# Patient Record
Sex: Female | Born: 1945
Health system: Southern US, Community
[De-identification: ages and names within clinical notes are randomized; demographics above are authoritative.]

## PROBLEM LIST (undated history)

## (undated) DIAGNOSIS — J300 Vasomotor rhinitis: Secondary | ICD-10-CM

## (undated) DIAGNOSIS — S82409A Unspecified fracture of shaft of unspecified fibula, initial encounter for closed fracture: Secondary | ICD-10-CM

## (undated) DIAGNOSIS — Z923 Personal history of irradiation: Secondary | ICD-10-CM

## (undated) DIAGNOSIS — H269 Unspecified cataract: Secondary | ICD-10-CM

## (undated) DIAGNOSIS — IMO0002 Reserved for concepts with insufficient information to code with codable children: Secondary | ICD-10-CM

## (undated) DIAGNOSIS — R011 Cardiac murmur, unspecified: Secondary | ICD-10-CM

## (undated) DIAGNOSIS — B009 Herpesviral infection, unspecified: Secondary | ICD-10-CM

## (undated) DIAGNOSIS — R55 Syncope and collapse: Secondary | ICD-10-CM

## (undated) DIAGNOSIS — B029 Zoster without complications: Secondary | ICD-10-CM

## (undated) DIAGNOSIS — I519 Heart disease, unspecified: Secondary | ICD-10-CM

## (undated) DIAGNOSIS — C50919 Malignant neoplasm of unspecified site of unspecified female breast: Secondary | ICD-10-CM

## (undated) DIAGNOSIS — I7300 Raynaud's syndrome without gangrene: Secondary | ICD-10-CM

## (undated) DIAGNOSIS — E039 Hypothyroidism, unspecified: Secondary | ICD-10-CM

## (undated) DIAGNOSIS — I10 Essential (primary) hypertension: Secondary | ICD-10-CM

## (undated) DIAGNOSIS — J4520 Mild intermittent asthma, uncomplicated: Secondary | ICD-10-CM

## (undated) DIAGNOSIS — E0500 Thyrotoxicosis with diffuse goiter without thyrotoxic crisis or storm: Secondary | ICD-10-CM

## (undated) DIAGNOSIS — D219 Benign neoplasm of connective and other soft tissue, unspecified: Secondary | ICD-10-CM

## (undated) DIAGNOSIS — J45909 Unspecified asthma, uncomplicated: Secondary | ICD-10-CM

## (undated) DIAGNOSIS — M8580 Other specified disorders of bone density and structure, unspecified site: Secondary | ICD-10-CM

## (undated) DIAGNOSIS — K5790 Diverticulosis of intestine, part unspecified, without perforation or abscess without bleeding: Secondary | ICD-10-CM

## (undated) DIAGNOSIS — Z8739 Personal history of other diseases of the musculoskeletal system and connective tissue: Secondary | ICD-10-CM

## (undated) DIAGNOSIS — E785 Hyperlipidemia, unspecified: Secondary | ICD-10-CM

## (undated) HISTORY — DX: Thyrotoxicosis with diffuse goiter without thyrotoxic crisis or storm: E05.00

## (undated) HISTORY — DX: Hyperlipidemia, unspecified: E78.5

## (undated) HISTORY — DX: Unspecified asthma, uncomplicated: J45.909

## (undated) HISTORY — DX: Vasomotor rhinitis: J30.0

## (undated) HISTORY — PX: OTHER SURGICAL HISTORY: SHX169

## (undated) HISTORY — PX: BREAST EXCISIONAL BIOPSY: SUR124

## (undated) HISTORY — DX: Mild intermittent asthma, uncomplicated: J45.20

## (undated) HISTORY — PX: THYROIDECTOMY: SHX17

## (undated) HISTORY — DX: Diverticulosis of intestine, part unspecified, without perforation or abscess without bleeding: K57.90

## (undated) HISTORY — PX: ABDOMINAL HYSTERECTOMY: SHX81

## (undated) HISTORY — DX: Unspecified cataract: H26.9

## (undated) HISTORY — DX: Unspecified fracture of shaft of unspecified fibula, initial encounter for closed fracture: S82.409A

## (undated) HISTORY — DX: Benign neoplasm of connective and other soft tissue, unspecified: D21.9

## (undated) HISTORY — DX: Other specified disorders of bone density and structure, unspecified site: M85.80

## (undated) HISTORY — DX: Hypothyroidism, unspecified: E03.9

## (undated) HISTORY — PX: MINOR BREAST BIOPSY: SHX5977

## (undated) HISTORY — DX: Syncope and collapse: R55

## (undated) HISTORY — DX: Personal history of irradiation: Z92.3

## (undated) HISTORY — DX: Zoster without complications: B02.9

## (undated) HISTORY — DX: Essential (primary) hypertension: I10

## (undated) HISTORY — DX: Personal history of other diseases of the musculoskeletal system and connective tissue: Z87.39

## (undated) HISTORY — DX: Herpesviral infection, unspecified: B00.9

## (undated) HISTORY — DX: Cardiac murmur, unspecified: R01.1

## (undated) HISTORY — DX: Heart disease, unspecified: I51.9

## (undated) HISTORY — PX: INCONTINENCE SURGERY: SHX676

## (undated) HISTORY — DX: Reserved for concepts with insufficient information to code with codable children: IMO0002

---

## 1996-02-02 DIAGNOSIS — D219 Benign neoplasm of connective and other soft tissue, unspecified: Secondary | ICD-10-CM

## 1996-02-02 HISTORY — DX: Benign neoplasm of connective and other soft tissue, unspecified: D21.9

## 1997-06-25 ENCOUNTER — Other Ambulatory Visit: Admission: RE | Admit: 1997-06-25 | Discharge: 1997-06-25 | Payer: Self-pay | Admitting: Obstetrics and Gynecology

## 1998-07-14 ENCOUNTER — Other Ambulatory Visit: Admission: RE | Admit: 1998-07-14 | Discharge: 1998-07-14 | Payer: Self-pay | Admitting: Gynecology

## 1999-07-06 ENCOUNTER — Encounter: Admission: RE | Admit: 1999-07-06 | Discharge: 1999-07-06 | Payer: Self-pay | Admitting: Obstetrics and Gynecology

## 1999-07-06 ENCOUNTER — Encounter: Payer: Self-pay | Admitting: Obstetrics and Gynecology

## 1999-08-10 ENCOUNTER — Other Ambulatory Visit: Admission: RE | Admit: 1999-08-10 | Discharge: 1999-08-10 | Payer: Self-pay | Admitting: Obstetrics and Gynecology

## 1999-12-23 ENCOUNTER — Inpatient Hospital Stay (HOSPITAL_COMMUNITY): Admission: AD | Admit: 1999-12-23 | Discharge: 1999-12-23 | Payer: Self-pay | Admitting: Obstetrics and Gynecology

## 2000-01-20 ENCOUNTER — Inpatient Hospital Stay (HOSPITAL_COMMUNITY): Admission: AD | Admit: 2000-01-20 | Discharge: 2000-01-20 | Payer: Self-pay | Admitting: Obstetrics and Gynecology

## 2000-02-02 DIAGNOSIS — M8580 Other specified disorders of bone density and structure, unspecified site: Secondary | ICD-10-CM

## 2000-02-02 HISTORY — DX: Other specified disorders of bone density and structure, unspecified site: M85.80

## 2000-02-02 HISTORY — PX: HYSTEROSCOPY WITH RESECTOSCOPE: SHX5395

## 2000-02-18 ENCOUNTER — Inpatient Hospital Stay (HOSPITAL_COMMUNITY): Admission: AD | Admit: 2000-02-18 | Discharge: 2000-02-18 | Payer: Self-pay | Admitting: Obstetrics and Gynecology

## 2000-03-22 ENCOUNTER — Encounter (HOSPITAL_COMMUNITY): Admission: AD | Admit: 2000-03-22 | Discharge: 2000-05-19 | Payer: Self-pay | Admitting: Obstetrics and Gynecology

## 2000-05-23 ENCOUNTER — Encounter (INDEPENDENT_AMBULATORY_CARE_PROVIDER_SITE_OTHER): Payer: Self-pay

## 2000-05-23 ENCOUNTER — Ambulatory Visit (HOSPITAL_COMMUNITY): Admission: RE | Admit: 2000-05-23 | Discharge: 2000-05-23 | Payer: Self-pay | Admitting: Obstetrics and Gynecology

## 2000-07-19 ENCOUNTER — Encounter: Payer: Self-pay | Admitting: Obstetrics and Gynecology

## 2000-07-19 ENCOUNTER — Encounter: Admission: RE | Admit: 2000-07-19 | Discharge: 2000-07-19 | Payer: Self-pay | Admitting: Obstetrics and Gynecology

## 2000-08-29 ENCOUNTER — Other Ambulatory Visit: Admission: RE | Admit: 2000-08-29 | Discharge: 2000-08-29 | Payer: Self-pay | Admitting: Obstetrics and Gynecology

## 2001-08-22 ENCOUNTER — Encounter: Payer: Self-pay | Admitting: Obstetrics and Gynecology

## 2001-08-22 ENCOUNTER — Encounter: Admission: RE | Admit: 2001-08-22 | Discharge: 2001-08-22 | Payer: Self-pay | Admitting: Obstetrics and Gynecology

## 2001-10-09 ENCOUNTER — Other Ambulatory Visit: Admission: RE | Admit: 2001-10-09 | Discharge: 2001-10-09 | Payer: Self-pay | Admitting: Obstetrics and Gynecology

## 2002-02-01 DIAGNOSIS — K5790 Diverticulosis of intestine, part unspecified, without perforation or abscess without bleeding: Secondary | ICD-10-CM

## 2002-02-01 HISTORY — DX: Diverticulosis of intestine, part unspecified, without perforation or abscess without bleeding: K57.90

## 2002-07-05 ENCOUNTER — Encounter: Payer: Self-pay | Admitting: Internal Medicine

## 2002-09-21 ENCOUNTER — Encounter: Payer: Self-pay | Admitting: Obstetrics and Gynecology

## 2002-09-21 ENCOUNTER — Encounter: Admission: RE | Admit: 2002-09-21 | Discharge: 2002-09-21 | Payer: Self-pay | Admitting: Obstetrics and Gynecology

## 2002-10-16 ENCOUNTER — Other Ambulatory Visit: Admission: RE | Admit: 2002-10-16 | Discharge: 2002-10-16 | Payer: Self-pay | Admitting: Obstetrics and Gynecology

## 2003-10-17 ENCOUNTER — Encounter: Admission: RE | Admit: 2003-10-17 | Discharge: 2003-10-17 | Payer: Self-pay | Admitting: Obstetrics and Gynecology

## 2003-11-13 ENCOUNTER — Other Ambulatory Visit: Admission: RE | Admit: 2003-11-13 | Discharge: 2003-11-13 | Payer: Self-pay | Admitting: Obstetrics and Gynecology

## 2003-12-18 ENCOUNTER — Ambulatory Visit: Payer: Self-pay | Admitting: Critical Care Medicine

## 2004-02-05 ENCOUNTER — Ambulatory Visit: Payer: Self-pay | Admitting: Critical Care Medicine

## 2004-07-28 ENCOUNTER — Ambulatory Visit: Payer: Self-pay | Admitting: Critical Care Medicine

## 2004-11-26 ENCOUNTER — Other Ambulatory Visit: Admission: RE | Admit: 2004-11-26 | Discharge: 2004-11-26 | Payer: Self-pay | Admitting: Obstetrics and Gynecology

## 2004-12-02 ENCOUNTER — Encounter: Admission: RE | Admit: 2004-12-02 | Discharge: 2004-12-02 | Payer: Self-pay | Admitting: Obstetrics and Gynecology

## 2005-02-01 DIAGNOSIS — S82409A Unspecified fracture of shaft of unspecified fibula, initial encounter for closed fracture: Secondary | ICD-10-CM

## 2005-02-01 HISTORY — DX: Unspecified fracture of shaft of unspecified fibula, initial encounter for closed fracture: S82.409A

## 2005-07-23 ENCOUNTER — Ambulatory Visit: Payer: Self-pay | Admitting: Critical Care Medicine

## 2005-09-14 ENCOUNTER — Encounter: Admission: RE | Admit: 2005-09-14 | Discharge: 2005-09-14 | Payer: Self-pay | Admitting: Endocrinology

## 2005-11-30 ENCOUNTER — Other Ambulatory Visit: Admission: RE | Admit: 2005-11-30 | Discharge: 2005-11-30 | Payer: Self-pay | Admitting: Obstetrics and Gynecology

## 2005-12-07 ENCOUNTER — Encounter: Admission: RE | Admit: 2005-12-07 | Discharge: 2005-12-07 | Payer: Self-pay | Admitting: Obstetrics and Gynecology

## 2006-04-14 ENCOUNTER — Ambulatory Visit: Payer: Self-pay | Admitting: Pulmonary Disease

## 2006-12-19 ENCOUNTER — Other Ambulatory Visit: Admission: RE | Admit: 2006-12-19 | Discharge: 2006-12-19 | Payer: Self-pay | Admitting: Obstetrics and Gynecology

## 2007-02-14 ENCOUNTER — Encounter: Admission: RE | Admit: 2007-02-14 | Discharge: 2007-02-14 | Payer: Self-pay | Admitting: Obstetrics and Gynecology

## 2007-02-15 DIAGNOSIS — J45909 Unspecified asthma, uncomplicated: Secondary | ICD-10-CM | POA: Insufficient documentation

## 2007-02-15 DIAGNOSIS — J309 Allergic rhinitis, unspecified: Secondary | ICD-10-CM | POA: Insufficient documentation

## 2007-02-16 ENCOUNTER — Ambulatory Visit: Payer: Self-pay | Admitting: Critical Care Medicine

## 2007-02-20 ENCOUNTER — Encounter: Payer: Self-pay | Admitting: Critical Care Medicine

## 2008-01-09 ENCOUNTER — Other Ambulatory Visit: Admission: RE | Admit: 2008-01-09 | Discharge: 2008-01-09 | Payer: Self-pay | Admitting: Obstetrics and Gynecology

## 2008-01-12 ENCOUNTER — Ambulatory Visit: Payer: Self-pay | Admitting: Critical Care Medicine

## 2008-01-18 ENCOUNTER — Ambulatory Visit: Payer: Self-pay | Admitting: Critical Care Medicine

## 2008-01-23 ENCOUNTER — Telehealth: Payer: Self-pay | Admitting: Critical Care Medicine

## 2008-01-31 ENCOUNTER — Telehealth (INDEPENDENT_AMBULATORY_CARE_PROVIDER_SITE_OTHER): Payer: Self-pay | Admitting: *Deleted

## 2008-02-02 HISTORY — PX: ROTATOR CUFF REPAIR: SHX139

## 2008-02-21 ENCOUNTER — Encounter: Admission: RE | Admit: 2008-02-21 | Discharge: 2008-02-21 | Payer: Self-pay | Admitting: Obstetrics and Gynecology

## 2008-02-27 ENCOUNTER — Encounter: Admission: RE | Admit: 2008-02-27 | Discharge: 2008-02-27 | Payer: Self-pay | Admitting: Obstetrics and Gynecology

## 2008-03-14 ENCOUNTER — Ambulatory Visit (HOSPITAL_BASED_OUTPATIENT_CLINIC_OR_DEPARTMENT_OTHER): Admission: RE | Admit: 2008-03-14 | Discharge: 2008-03-15 | Payer: Self-pay | Admitting: Orthopedic Surgery

## 2008-03-19 ENCOUNTER — Telehealth: Payer: Self-pay | Admitting: Internal Medicine

## 2009-01-20 ENCOUNTER — Encounter: Admission: RE | Admit: 2009-01-20 | Discharge: 2009-01-20 | Payer: Self-pay | Admitting: Obstetrics and Gynecology

## 2009-02-21 ENCOUNTER — Ambulatory Visit: Payer: Self-pay | Admitting: Critical Care Medicine

## 2009-02-25 DIAGNOSIS — I10 Essential (primary) hypertension: Secondary | ICD-10-CM | POA: Insufficient documentation

## 2009-03-12 ENCOUNTER — Encounter: Admission: RE | Admit: 2009-03-12 | Discharge: 2009-03-12 | Payer: Self-pay | Admitting: Obstetrics and Gynecology

## 2009-03-20 ENCOUNTER — Ambulatory Visit: Payer: Self-pay | Admitting: Critical Care Medicine

## 2009-07-04 ENCOUNTER — Telehealth (INDEPENDENT_AMBULATORY_CARE_PROVIDER_SITE_OTHER): Payer: Self-pay | Admitting: *Deleted

## 2009-12-17 ENCOUNTER — Ambulatory Visit: Payer: Self-pay | Admitting: Critical Care Medicine

## 2010-01-14 ENCOUNTER — Ambulatory Visit: Payer: Self-pay | Admitting: Critical Care Medicine

## 2010-03-03 NOTE — Assessment & Plan Note (Signed)
Summary: NP follow up - cough   Primary Provider/Referring Provider:  [facility]   CC:  was given a zpak then avelox by Dr. Evlyn Kanner for cough and sinus infection.  finished avelox 5days ago, still having dry cough, chest congestion, and occ wheezing and SOB x2weeks - denies f/c/s.  History of Present Illness: 65 year old, white female, with mild intermittent asthma.    January 12, 2008 -- No recent changes.  No complaints.  Not using rescue inhaler.  February 21, 2009--Presents for asthma flare: tightness in chest, increased SOB x3-4weeks - denies cough, wheezing. Has felt lightheaded over last few days. C/O if she takes in real deep breath she feels lightheaded. Lightheaded if she stands up. B/P is low today in office.     March 20, 2009 --Pt was seen 02/21/09.  No new issues.    December 17, 2009--Presents for an acute office visit. Complains of cough, congestion. He  was given a zpak then avelox by Dr. Evlyn Kanner for cough and sinus infection.  finished avelox 5days ago, still having dry cough, chest congestion, occ wheezing and SOB x2weeks. She is feeling better w/ decreased congestion but cough has not totally resolved and has some wheezing. Denies chest pain, , orthopnea, hemoptysis, fever, n/v/d, edema, headache.   Medications Prior to Update: 1)  Asmanex 60 Metered Doses 220 Mcg/inh Aepb (Mometasone Furoate) .... Inhale 1 Puff As Directed Once A Day 2)  Synthroid 75 Mcg Tabs (Levothyroxine Sodium) .... Take 1 Tablet By Mouth Once A Day 3)  Mucinex 600 Mg  Tb12 (Guaifenesin) .... As Needed 4)  Caduet 5-10 Mg  Tabs (Amlodipine-Atorvastatin) .... Take 1 Tablet By Mouth Once A Day 5)  Activella 0.5-0.1 Mg  Tabs (Estradiol-Norethindrone Acet) .... Three Times A Week 6)  Ventolin Hfa 108 (90 Base) Mcg/act  Aers (Albuterol Sulfate) .Marland Kitchen.. 1-2 Puffs Every 4-6 Hours As Needed 7)  Vitamin D 1000 Unit Tabs (Cholecalciferol) .Marland Kitchen.. 1 Tab By Mouth Three Times A Week 8)  Oscal 500/200 D-3 500-200 Mg-Unit  Tabs (Calcium-Vitamin D) .... Take 1 Tablet By Mouth Once A Day 9)  Extratest Hormone .Marland Kitchen.. 3 Times A Week  Current Medications (verified): 1)  Asmanex 60 Metered Doses 220 Mcg/inh Aepb (Mometasone Furoate) .... Inhale 1 Puff As Directed Once A Day 2)  Synthroid 75 Mcg Tabs (Levothyroxine Sodium) .... Take 1 Tablet By Mouth Once A Day 3)  Mucinex 600 Mg  Tb12 (Guaifenesin) .... As Needed 4)  Caduet 5-10 Mg  Tabs (Amlodipine-Atorvastatin) .... Take 1 Tablet By Mouth Once A Day 5)  Activella 0.5-0.1 Mg  Tabs (Estradiol-Norethindrone Acet) .... Three Times A Week 6)  Ventolin Hfa 108 (90 Base) Mcg/act  Aers (Albuterol Sulfate) .Marland Kitchen.. 1-2 Puffs Every 4-6 Hours As Needed 7)  Vitamin D 1000 Unit Tabs (Cholecalciferol) .Marland Kitchen.. 1 Tab By Mouth Twcie A Week 8)  Oscal 500/200 D-3 500-200 Mg-Unit Tabs (Calcium-Vitamin D) .... Take 1 Tablet By Mouth Once A Day 9)  Extratest Hormone .Marland Kitchen.. 3 Times A Week  Allergies (verified): 1)  ! Pcn 2)  ! * Pneumovax  Past History:  Past Medical History: Last updated: 01/12/2008 Current Problems:  ASTHMA, INTERMITTENT, MILD (ICD-493.90)    -FeV1 96% 2004 ASTHMATIC BRONCHITIS, ACUTE (ICD-466.0) RHINITIS, VASOMOTOR (ICD-477.9)  Family History: Last updated: 02/21/2009 rheumatism - mother aneurysm - mother  Social History: Last updated: 02/21/2009 Patient never smoked.  Positive history of passive tobacco smoke exposure.  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 Evlyn Kanner for cough and sinus infection.  finished avelox 5days ago, still having dry cough, chest congestion, occ wheezing and SOB x2weeks - denies f/c/s Is Patient Diabetic? No Comments Medications reviewed with patient Daytime contact number verified with patient. Boone Master CNA/MA  December 17, 2009 11:22 AM    Physical Exam  Additional Exam:  Gen: WD WN      WF    in NAD    NCAT Heent:  no jvd, no TMG, no cervical LNademopathy, orophyx clear,  nares with clear watery drainage. Cor: RRR nl s1/s2  no s3/s4  no m r h g Abd: soft NT BSA   no masses  No HSM  no rebound or guarding Ext perfused with no c v e v.d Neuro: intact, moves all 4s, CN II-XII intact, DTRs intact Chest: coarse BS w/ no wheezing  Skin: clear  Genital/Rectal :deferred    Impression & Recommendations:  Problem # 1:  ASTHMATIC BRONCHITIS, ACUTE (ICD-466.0)  Slow to resolve exacerbation Plan Prednisone taper over next week.  Zyrtec 10mg  at bedtime for drainage, as needed  Tessalon three times a day as needed cough Try not to cough or clear your throat. use sugarless candy, ice chips, water  Mucinex DM two times a day as needed cough.  Please contact office for sooner follow up if symptoms do not improve or worsen  Her updated medication list for this problem includes:    Asmanex 60 Metered Doses 220 Mcg/inh Aepb (Mometasone furoate) ..... Inhale 1 puff as directed once a day    Mucinex 600 Mg Tb12 (Guaifenesin) .Marland Kitchen... As needed    Ventolin Hfa 108 (90 Base) Mcg/act Aers (Albuterol sulfate) .Marland Kitchen... 1-2 puffs every 4-6 hours as needed    Tessalon 200 Mg Caps (Benzonatate) .Marland Kitchen... 1 by mouth three times a day as needed cough  Orders: Est. Patient Level IV ([address])  Medications Added to Medication List This Visit: 1)  Vitamin D 1000 Unit Tabs (Cholecalciferol) .Marland Kitchen.. 1 tab by mouth twcie a week 2)  Prednisone 10 Mg Tabs (Prednisone) .... 4 tabs for 2 days, then 3 tabs for 2  days, 2 tabs for 2 days, then 1 tab for 2 days, then stop 3)  Tessalon 200 Mg Caps (Benzonatate) .Marland Kitchen.. 1 by mouth three times a day as needed cough  Complete Medication List: 1)  Asmanex 60 Metered Doses 220 Mcg/inh Aepb (Mometasone furoate) .... Inhale 1 puff as directed once a day 2)  Synthroid 75 Mcg Tabs (Levothyroxine sodium) .... Take 1 tablet by mouth once a day 3)  Mucinex 600 Mg Tb12 (Guaifenesin) .... As needed 4)  Caduet 5-10 Mg Tabs (Amlodipine-atorvastatin) .... Take 1 tablet by mouth once a day 5)  Activella 0.5-0.1 Mg Tabs (Estradiol-norethindrone acet) .... Three times a week  6)  Ventolin Hfa 108 (90 Base) Mcg/act Aers (Albuterol sulfate) .Marland Kitchen.. 1-2 puffs every 4-6 hours as needed 7)  Vitamin D 1000 Unit Tabs (Cholecalciferol) .Marland Kitchen.. 1 tab by mouth twcie a week 8)  Oscal 500/200 D-3 500-200 Mg-unit Tabs (Calcium-vitamin d) .... Take 1 tablet by mouth once a day 9)  Extratest Hormone  .Marland Kitchen.. 3 times a week 10)  Prednisone 10 Mg Tabs (Prednisone) .... 4 tabs for 2 days, then 3 tabs for 2 days, 2 tabs for 2 days, then 1 tab for 2 days, then stop 11)  Tessalon 200 Mg Caps (Benzonatate) .Marland Kitchen.. 1 by mouth three times a day as needed cough  Patient Instructions: 1)  Prednisone taper over next week.  2)  Zyrtec 10mg  at bedtime for drainage, as needed  3)  Tessalon three times a day as needed cough 4)  Try not to cough or clear your throat. use sugarless candy, ice chips, water  5)  Mucinex DM two times a day as needed cough.  6)  Please contact office for sooner follow up if symptoms do not improve or worsen  7)  follow up Dr. Delford Field in 3 weeks  Prescriptions: TESSALON 200 MG CAPS (BENZONATATE) 1 by mouth three times a day as needed cough  #30 x 0   Entered and Authorized by:   Rubye Oaks NP   Signed by:   Tammy Parrett NP on 12/17/2009   Method used:   Electronically to        [facility]. [address] [phone]* (retail)       [address]       [place], [place]   [address]       Ph: [phone] or [phone]       Fax: [phone]   RxID:   [id number] PREDNISONE 10 MG TABS (PREDNISONE) 4 tabs for 2 days, then 3 tabs for 2 days, 2 tabs for 2 days, then 1 tab for 2 days, then stop  #20 x 0   Entered and Authorized by:   Rubye Oaks NP   Signed by:   Tammy Parrett NP on 12/17/2009   Method used:   Electronically to        [facility]. [address] [phone]* (retail)       [address]       [place], [place]  [address]       Ph: [phone] or [phone]       Fax: [phone]   RxID:   [id number]    Immunization History:  Influenza Immunization History:    Influenza:  historical (12/02/2009)

## 2010-03-03 NOTE — Assessment & Plan Note (Signed)
Summary: Acute NP office visit -asthma   Primary Provider/Referring Provider:  [facility]   CC:  asthma flare: tightness in chest, increased SOB x3-4weeks - denies cough, and wheezing.  History of Present Illness: This is a 65 year old, white female, with mild intermittent asthma.    January 12, 2008 -- No recent changes.  No complaints.  Not using rescue inhaler.  February 21, 2009--Presents for asthma flare: tightness in chest, increased SOB x3-4weeks - denies cough, wheezing. Has felt lightheaded over last few days. C/O if she takes in real deep breath she feels lightheaded. Lightheaded if she stands up. B/P is low today in office.     Medications Prior to Update: 1)  Asmanex 60 Metered Doses 220 Mcg/inh Aepb (Mometasone Furoate) .... Inhale 1 Puff As Directed Once A Day 2)  Synthroid 100 Mcg  Tabs (Levothyroxine Sodium) .... Take 1 Tablet By Mouth Once A Day 3)  Mucinex 600 Mg  Tb12 (Guaifenesin) .... As Needed 4)  Caduet 5-10 Mg  Tabs (Amlodipine-Atorvastatin) .... Take 1 Tablet By Mouth Once A Day 5)  Activella 0.5-0.1 Mg  Tabs (Estradiol-Norethindrone Acet) .... Three Times A Week 6)  Ventolin Hfa 108 (90 Base) Mcg/act  Aers (Albuterol Sulfate) .Marland Kitchen.. 1-2 Puffs Every 4-6 Hours As Needed 7)  Zithromax Z-Pak 250 Mg Tabs (Azithromycin) .... Use As Directed  Current Medications (verified): 1)  Asmanex 60 Metered Doses 220 Mcg/inh Aepb (Mometasone Furoate) .... Inhale 1 Puff As Directed Once A Day 2)  Synthroid 75 Mcg Tabs (Levothyroxine Sodium) .... Take 1 Tablet By Mouth Once A Day 3)  Mucinex 600 Mg  Tb12 (Guaifenesin) .... As Needed 4)  Caduet 5-10 Mg  Tabs (Amlodipine-Atorvastatin) .... Take 1 Tablet By Mouth Once A Day 5)  Activella 0.5-0.1 Mg  Tabs (Estradiol-Norethindrone Acet) .... Three Times A Week 6)  Ventolin Hfa 108 (90 Base) Mcg/act  Aers (Albuterol Sulfate) .Marland Kitchen.. 1-2 Puffs Every 4-6 Hours As Needed 7)  Vitamin D 1000 Unit Tabs (Cholecalciferol) .Marland Kitchen.. 1 Tab By Mouth Three  Times A Week 8)  Oscal 500/200 D-3 500-200 Mg-Unit Tabs (Calcium-Vitamin D) .... Take 1 Tablet By Mouth Once A Day 9)  Extratest Hormone .Marland Kitchen.. 3 Times A Week  Allergies (verified): 1)  ! Pcn 2)  ! * Pneumovax  Past History:  Family History: Last updated: 02/21/2009 rheumatism - mother aneurysm - mother  Social History: Last updated: 02/21/2009 Patient never smoked.  Positive history of passive tobacco smoke exposure.  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  Positive history of passive tobacco smoke exposure.  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 No Comments Medications reviewed with patient Daytime contact number verified with patient. Boone Master CNA  February 21, 2009 2:53 PM     Impression & Recommendations:  Problem # 1:  ASTHMA, INTERMITTENT, MILD (ICD-493.90) Mild flare:  REC;  Increase Asmanex 2 puffs two times a day for 2 weeks then back daily   Please contact office for sooner follow up if symptoms do not improve or worsen  follow up Dr. Delford Field as scheduled.   The following medications were removed from the medication list:    Zithromax Z-pak 250 Mg Tabs (Azithromycin) ..... Use as directed Her updated medication list for this problem includes:    Asmanex 60 Metered Doses 220 Mcg/inh Aepb  (Mometasone furoate) ..... Inhale 1 puff as directed once a day    Mucinex 600 Mg Tb12 (Guaifenesin) .Marland Kitchen... As needed    Ventolin Hfa 108 (90 Base) Mcg/act Aers (Albuterol sulfate) .Marland Kitchen... 1-2 puffs every 4-6 hours as needed  Problem # 2:  HYPERTENSION (ICD-401.9) Hold Caduet today Restart tomorrow , if  blood pressure is <110 hold caduet.  follow up Dr. Evlyn Kanner next week for blood pressure   Her updated medication list for this problem includes:    Caduet 5-10 Mg Tabs (Amlodipine-atorvastatin) .Marland Kitchen... Take 1 tablet by mouth once a day  Medications Added to Medication List This Visit: 1)  Synthroid 75 Mcg Tabs (Levothyroxine sodium) .... Take 1 tablet by mouth once a day 2)  Vitamin D 1000 Unit Tabs (Cholecalciferol) .Marland Kitchen.. 1 tab by mouth three times a week 3)  Oscal 500/200 D-3 500-200 Mg-unit Tabs (Calcium-vitamin d) .... Take 1 tablet by mouth once a day 4)  Extratest Hormone  .Marland Kitchen.. 3 times a week  Complete Medication List: 1)  Asmanex 60 Metered Doses 220 Mcg/inh Aepb (Mometasone furoate) .... Inhale 1 puff as directed once a day 2)  Synthroid 75 Mcg Tabs (Levothyroxine sodium) .... Take 1 tablet by mouth once a day 3)  Mucinex 600 Mg Tb12 (Guaifenesin) .... As needed 4)  Caduet 5-10 Mg Tabs (Amlodipine-atorvastatin) .... Take 1 tablet by mouth once a day 5)  Activella 0.5-0.1 Mg Tabs (Estradiol-norethindrone acet) .... Three times a week 6)  Ventolin Hfa 108 (90 Base) Mcg/act Aers (Albuterol sulfate) .Marland Kitchen.. 1-2 puffs every 4-6 hours as needed 7)  Vitamin D 1000 Unit Tabs (Cholecalciferol) .Marland Kitchen.. 1 tab by mouth three times a week 8)  Oscal 500/200 D-3 500-200 Mg-unit Tabs (Calcium-vitamin d) .... Take 1 tablet by mouth once a day 9)  Extratest Hormone  .Marland Kitchen.. 3 times a week  Patient Instructions: 1)  Increase Asmanex 2 puffs two times a day for 2 weeks then back daily  2)  Hold Caduet today 3)  Restart tomorrow , if  blood pressure is <110 hold caduet.  4)  follow up Dr. Evlyn Kanner next week for  blood pressure  5)  Please contact office for sooner follow up if symptoms do not improve or worsen  6)  follow up Dr. Delford Field as scheduled.    Immunization History:  Influenza Immunization History:    Influenza:  historical (11/01/2008)  Pneumovax Immunization History:    Pneumovax:  pt allergic (02/21/2009)   Appended Document: Acute NP office visit -asthma   Appended Document: Acute NP office visit -asthma I agree with this plan of care   pw

## 2010-03-03 NOTE — Assessment & Plan Note (Signed)
Summary: Pulmonary OV   Primary Provider/Referring Provider:  [facility]   CC:  Yearly follow up for refills.  states breathing is doing good-denies wheezing, chest tightness, and cough.  No problems per pt.  Requesting rxs for asmanex and ventolin-[facility] [place].  .  History of Present Illness: This is a 65 year old, white female, with mild intermittent asthma.    January 12, 2008 -- No recent changes.  No complaints.  Not using rescue inhaler.  February 21, 2009--Presents for asthma flare: tightness in chest, increased SOB x3-4weeks - denies cough, wheezing. Has felt lightheaded over last few days. C/O if she takes in real deep breath she feels lightheaded. Lightheaded if she stands up. B/P is low today in office.     March 20, 2009 4:27 PM Pt was seen 02/21/09.  No new issues.   Pt denies any significant sore throat, nasal congestion or excess secretions, fever, chills, sweats, unintended weight loss, pleurtic or exertional chest pain, orthopnea PND, or leg swelling Pt denies any increase in rescue therapy over baseline, denies waking up needing it or having any early am or nocturnal exacerbations of coughing/wheezing/or dyspnea.   Asthma History    Initial Asthma Severity Rating:    Age range: 12+ years    Symptoms: 0-2 days/week    Nighttime Awakenings: 0-2/month    Interferes w/ normal activity: no limitations    SABA use (not for EIB): 0-2 days/week    Asthma Severity Assessment: Intermittent   Preventive Screening-Counseling & Management  Alcohol-Tobacco     Smoking Status: quit > 6 months  Current Medications (verified): 1)  Asmanex 60 Metered Doses 220 Mcg/inh Aepb (Mometasone Furoate) .... Inhale 1 Puff As Directed Once A Day 2)  Synthroid 75 Mcg Tabs (Levothyroxine Sodium) .... Take 1 Tablet By Mouth Once A Day 3)  Mucinex 600 Mg  Tb12 (Guaifenesin) .... As Needed 4)  Caduet 5-10 Mg  Tabs (Amlodipine-Atorvastatin) .... Take 1 Tablet By Mouth Once A Day 5)   Activella 0.5-0.1 Mg  Tabs (Estradiol-Norethindrone Acet) .... Three Times A Week 6)  Ventolin Hfa 108 (90 Base) Mcg/act  Aers (Albuterol Sulfate) .Marland Kitchen.. 1-2 Puffs Every 4-6 Hours As Needed 7)  Vitamin D 1000 Unit Tabs (Cholecalciferol) .Marland Kitchen.. 1 Tab By Mouth Three Times A Week 8)  Oscal 500/200 D-3 500-200 Mg-Unit Tabs (Calcium-Vitamin D) .... Take 1 Tablet By Mouth Once A Day 9)  Extratest Hormone .Marland Kitchen.. 3 Times A Week  Allergies (verified): 1)  ! Pcn 2)  ! * Pneumovax  Past History:  Past medical, surgical, family and social histories (including risk factors) reviewed for relevance to current acute and chronic problems.  Past Medical History: Reviewed history from 01/12/2008 and no changes required. Current Problems:  ASTHMA, INTERMITTENT, MILD (ICD-493.90)    -FeV1 96% 2004 ASTHMATIC BRONCHITIS, ACUTE (ICD-466.0) RHINITIS, VASOMOTOR (ICD-477.9)  Family History: Reviewed history from 02/21/2009 and no changes required. rheumatism - mother aneurysm - mother  Social History: Reviewed history from 02/21/2009 and no changes required. Patient never smoked.  Positive history of passive tobacco smoke exposure.  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    Vital Signs:  Patient profile:   65 year old female Height:      62.5 inches Weight:      124.38 pounds BMI:     22.47 O2 Sat:      98 % on Room air Temp:     98.2  degrees F oral Pulse rate:   57 / minute BP sitting:   152 / 76  (left arm) Cuff size:   regular  Vitals Entered By: Gweneth Dimitri RN (March 20, 2009 4:15 PM)  O2 Flow:  Room air CC: Yearly follow up for refills.  states breathing is doing good-denies wheezing, chest tightness, cough.  No problems per pt.  Requesting rxs for asmanex and ventolin-[facility] [place].   Comments Medications reviewed with patient Daytime contact number verified with patient. Gweneth Dimitri RN  March 20, 2009 4:15 PM    Physical Exam  Additional Exam:  Gen: WD WN      WF    in NAD    NCAT Heent:  no jvd, no TMG, no cervical LNademopathy, orophyx clear,  nares with clear watery drainage. Cor: RRR nl s1/s2  no s3/s4  no m r h g Abd: soft NT BSA   no masses  No HSM  no rebound or guarding Ext perfused with no c v e v.d Neuro: intact, moves all 4s, CN II-XII intact, DTRs intact Chest: Clear  no wheezes, rales, rhonchi   no egophony  no consolidative breath sounds, mild hyperresonance to percussion Skin: clear  Genital/Rectal :deferred    Impression & Recommendations:  Problem # 1:  ASTHMA, INTERMITTENT, MILD (ICD-493.90) Assessment Improved improved asthma control plan cont same inhaled program  Complete Medication List: 1)  Asmanex 60 Metered Doses 220 Mcg/inh Aepb (Mometasone furoate) .... Inhale 1 puff as directed once a day 2)  Synthroid 75 Mcg Tabs (Levothyroxine sodium) .... Take 1 tablet by mouth once a day 3)  Mucinex 600 Mg Tb12 (Guaifenesin) .... As needed 4)  Caduet 5-10 Mg Tabs (Amlodipine-atorvastatin) .... Take 1 tablet by mouth once a day 5)  Activella 0.5-0.1 Mg Tabs (Estradiol-norethindrone acet) .... Three times a week 6)  Ventolin Hfa 108 (90 Base) Mcg/act Aers (Albuterol sulfate) .Marland Kitchen.. 1-2 puffs every 4-6 hours as needed 7)  Vitamin D 1000 Unit Tabs (Cholecalciferol) .Marland Kitchen.. 1 tab by mouth three times a week 8)  Oscal 500/200 D-3 500-200 Mg-unit Tabs (Calcium-vitamin d) .... Take  1 tablet by mouth once a day 9)  Extratest Hormone  .Marland Kitchen.. 3 times a week  Other Orders: Est. Patient Level III ([address])  Patient Instructions: 1)  No change in medications 2)  Return 12 months or sooner if necessary Prescriptions: ASMANEX 60 METERED DOSES 220 MCG/INH AEPB (MOMETASONE FUROATE) Inhale 1 puff as directed once a day  #3 x 4   Entered and Authorized by:   Storm Frisk MD   Signed by:   Storm Frisk MD on 03/20/2009   Method used:   Electronically to        [facility] [address] [phone]* (retail)       [address]       [place], [place]  [address]       Ph: [phone] or [phone]       Fax: [phone]   RxID:   [id number]

## 2010-03-03 NOTE — Progress Notes (Signed)
Summary: rx req/ congestion  Phone Note Call from Patient   Caller: Patient Call For: [facility] Summary of Call: pt req zpac. pt states that she has felt tight in her chest - also hoarseness x 2 days. says dr Delford Field usually prescribes a zpac for her and this "knocks it out fast. pt has not used any OTC meds as she says they never help w/ this. pt denies fever. no N or V. [place]- [facility] [place] [address] [phone]. pt# [phone] Initial call taken by: Tivis Ringer, CNA,  July 04, 2009 8:51 AM  Follow-up for Phone Call        pt c/o chest tightness, and hoarseness x 2 days. pt denies cough or fever at this time. [place] and wants an rx for a zpac be called in in case she gets worse over the weekend. Sh erequests this messge be sent to TP. Please advise. thanks.Carron Curie CMA  July 04, 2009 9:43 AM   Additional Follow-up for Phone Call Additional follow up Details #1::        Zpack #1 take as directed.  no refills Please contact office for sooner follow up if symptoms do not improve or worsen  Additional Follow-up by: Rubye Oaks NP,  July 04, 2009 10:06 AM    Additional Follow-up for Phone Call Additional follow up Details #2::    rx called to pharmacy in [place] and pt aware, also told pt if no better after antibiotic would need ov Follow-up by: Philipp Deputy CMA,  July 04, 2009 10:35 AM

## 2010-03-05 ENCOUNTER — Ambulatory Visit (INDEPENDENT_AMBULATORY_CARE_PROVIDER_SITE_OTHER): Payer: PRIVATE HEALTH INSURANCE | Admitting: Critical Care Medicine

## 2010-03-05 ENCOUNTER — Encounter: Payer: Self-pay | Admitting: Critical Care Medicine

## 2010-03-05 DIAGNOSIS — J45909 Unspecified asthma, uncomplicated: Secondary | ICD-10-CM

## 2010-03-11 NOTE — Assessment & Plan Note (Addendum)
Summary: Pulmonary OV   Vital Signs:  Patient profile:   65 year old female Height:      62.5 inches Weight:      120 pounds BMI:     21.68 O2 Sat:      98 % on Room air Temp:     98.2 degrees F oral Pulse rate:   60 / minute BP sitting:   112 / 76  (right arm) Cuff size:   regular  Vitals Entered By: Gweneth Dimitri RN (March 05, 2010 11:47 AM)  O2 Flow:  Room air CC: 3 month follow up.  Pt states overall breathing is doing "good."  Breathing is at baseline.  Denies wheezing, chest tightness, cough. Comments Medications reviewed with patient Daytime contact number verified with patient. Gweneth Dimitri RN  March 05, 2010 11:46 AM    Primary Provider/Referring Provider:  [facility]   CC:  3 month follow up.  Pt states overall breathing is doing "good."  Breathing is at baseline.  Denies wheezing, chest tightness, and cough..  History of Present Illness: 65 year old, white female, with mild intermittent asthma.    January 12, 2008 -- No recent changes.  No complaints.  Not using rescue inhaler.  February 21, 2009--Presents for asthma flare: tightness in chest, increased SOB x3-4weeks - denies cough, wheezing. Has felt lightheaded over last few days. C/O if she takes in real deep breath she feels lightheaded. Lightheaded if she stands up. B/P is low today in office.     March 20, 2009 --Pt was seen 02/21/09.  No new issues.    December 17, 2009--Presents for an acute office visit. Complains of cough, congestion. He  was given a zpak then avelox by Dr. Evlyn Kanner for cough and sinus infection.  finished avelox 5days ago, still having dry cough, chest congestion, occ wheezing and SOB x2weeks. She is feeling better w/ decreased congestion but cough has not totally resolved and has some wheezing. Denies chest pain, , orthopnea, hemoptysis, fever, n/v/d, edema, headache.    March 05, 2010 11:50 AM No prob since 11/11 ov. Now: nor real cough,  occ dry cough,  no wheeze,  no pn  drip  Asthma History    Asthma Control Assessment:    Age range: 12+ years    Symptoms: 0-2 days/week    Nighttime Awakenings: 0-2/month    Interferes w/ normal activity: no limitations    SABA use (not for EIB): 0-2 days/week    ATAQ questionnaire: 0    Exacerbations requiring oral systemic steroids: 0-1/year    Asthma Control Assessment: Well Controlled   Current Medications (verified): 1)  Asmanex 60 Metered Doses 220 Mcg/inh Aepb (Mometasone Furoate) .... Inhale 1 Puff As Directed Once A Day 2)  Synthroid 75 Mcg Tabs (Levothyroxine Sodium) .... Take 1 Tablet By Mouth Once A Day 3)  Mucinex 600 Mg  Tb12 (Guaifenesin) .... As Needed 4)  Caduet 5-10 Mg  Tabs (Amlodipine-Atorvastatin) .... Take 1 Tablet By Mouth Once A Day 5)  Activella 0.5-0.1 Mg  Tabs (Estradiol-Norethindrone Acet) .... Three Times A Week 6)  Ventolin Hfa 108 (90 Base) Mcg/act  Aers (Albuterol Sulfate) .Marland Kitchen.. 1-2 Puffs Every 4-6 Hours As Needed 7)  Vitamin D 1000 Unit Tabs (Cholecalciferol) .Marland Kitchen.. 1 Tab By Mouth Twcie A Week 8)  Oscal 500/200 D-3 500-200 Mg-Unit Tabs (Calcium-Vitamin D) .... Take 1 Tablet By Mouth Once A Day 9)  Extratest Hormone .Marland Kitchen.. 3 Times A Week 10)  Tessalon 200 Mg Caps (  Benzonatate) .Marland Kitchen.. 1 By Mouth Three Times A Day As Needed Cough  Allergies (verified): 1)  ! Pcn 2)  ! * Pneumovax  Past History:  Past medical, surgical, family and social histories (including risk factors) reviewed, and no changes noted (except as noted below).  Past Medical History: Reviewed history from 01/12/2008 and no changes required. Current Problems:  ASTHMA, INTERMITTENT, MILD (ICD-493.90)    -FeV1 96% 2004 ASTHMATIC BRONCHITIS, ACUTE (ICD-466.0) RHINITIS, VASOMOTOR (ICD-477.9)  Family History: Reviewed history from 02/21/2009 and no changes required. rheumatism - mother aneurysm - mother  Social History: Reviewed history from 02/21/2009 and no changes required. Patient never smoked.  Positive history of  passive tobacco smoke exposure.  social alcohol married 2 children  One son with chronic granulomatous disease  homemaker  Review of Systems  The patient denies shortness of breath with activity, shortness of breath at rest, productive cough, non-productive cough, coughing up blood, chest pain, irregular heartbeats, acid heartburn, indigestion, loss of appetite, weight change, abdominal pain, difficulty swallowing, sore throat, tooth/dental problems, headaches, nasal congestion/difficulty breathing through nose, sneezing, itching, ear ache, anxiety, depression, hand/feet swelling, joint stiffness or pain, rash, change in color of mucus, and fever.    Physical Exam  Additional Exam:  Gen: WD WN      WF    in NAD    NCAT Heent:  no jvd, no TMG, no cervical LNademopathy, orophyx clear,  nares with clear watery drainage. Cor: RRR nl s1/s2  no s3/s4  no m r h g Abd: soft NT BSA   no masses  No HSM  no rebound or guarding Ext perfused with no c v e v.d Neuro: intact, moves all 4s, CN II-XII intact, DTRs intact Chest: clear , no wheeze Skin: clear  Genital/Rectal :deferred    Impression & Recommendations:  Problem # 1:  ASTHMA, INTERMITTENT, MILD (ICD-493.90) Assessment Improved asthma improved plan cont ics no other changes       Complete Medication List: 1)  Asmanex 60 Metered Doses 220 Mcg/inh Aepb (Mometasone furoate) .... Inhale 1 puff as directed once a day 2)  Synthroid 75 Mcg Tabs (Levothyroxine sodium) .... Take 1 tablet by mouth once a day 3)  Mucinex 600 Mg Tb12 (Guaifenesin) .... As needed 4)  Caduet 5-10 Mg Tabs (Amlodipine-atorvastatin) .... Take 1 tablet by mouth once a day 5)  Activella 0.5-0.1 Mg Tabs (Estradiol-norethindrone acet) .... Three times a week 6)  Ventolin Hfa 108 (90 Base) Mcg/act Aers (Albuterol sulfate) .Marland Kitchen.. 1-2 puffs every 4-6 hours as needed 7)  Vitamin D 1000 Unit Tabs (Cholecalciferol) .Marland Kitchen.. 1 tab by mouth twcie a week 8)  Oscal 500/200 D-3 500-200  Mg-unit Tabs (Calcium-vitamin d) .... Take 1 tablet by mouth once a day 9)  Extratest Hormone  .Marland Kitchen.. 3 times a week 10)  Tessalon 200 Mg Caps (Benzonatate) .Marland Kitchen.. 1 by mouth three times a day as needed cough  Other Orders: Est. Patient Level III (62130)  Patient Instructions: 1)  No change in medications 2)  Return in      12    months Prescriptions: VENTOLIN HFA 108 (90 BASE) MCG/ACT  AERS (ALBUTEROL SULFATE) 1-2 puffs every 4-6 hours as needed Brand medically necessary #1 x 6   Entered and Authorized by:   Storm Frisk MD   Signed by:   Storm Frisk MD on 03/05/2010   Method used:   Electronically to        [facility] [address] [phone]* (retail)  [address]       [place], [place]  [address]       Ph: [phone] or [phone]       Fax: [phone]   RxID:   [id number] ASMANEX 60 METERED DOSES 220 MCG/INH AEPB (MOMETASONE FUROATE) Inhale 1 puff as directed once a day  #3 x 4   Entered and Authorized by:   Storm Frisk MD   Signed by:   Storm Frisk MD on 03/05/2010   Method used:   Electronically to        [facility] [address] [phone]* (retail)       [address]       [place], [place]  [address]       Ph: [phone] or [phone]       Fax: [phone]   RxID:   [id number]    Orders Added: 1)  Est. Patient Level III [02542]  Appended Document: Pulmonary OV fax [facility]

## 2010-03-31 ENCOUNTER — Other Ambulatory Visit: Payer: Self-pay | Admitting: Obstetrics and Gynecology

## 2010-03-31 DIAGNOSIS — Z1231 Encounter for screening mammogram for malignant neoplasm of breast: Secondary | ICD-10-CM

## 2010-04-09 ENCOUNTER — Ambulatory Visit: Payer: PRIVATE HEALTH INSURANCE

## 2010-04-22 ENCOUNTER — Ambulatory Visit
Admission: RE | Admit: 2010-04-22 | Discharge: 2010-04-22 | Disposition: A | Discharge status: Home / Self Care | Payer: PRIVATE HEALTH INSURANCE | Source: Ambulatory Visit | Attending: Obstetrics and Gynecology | Admitting: Obstetrics and Gynecology

## 2010-04-22 DIAGNOSIS — Z1231 Encounter for screening mammogram for malignant neoplasm of breast: Secondary | ICD-10-CM

## 2010-05-07 ENCOUNTER — Telehealth: Payer: Self-pay | Admitting: Critical Care Medicine

## 2010-05-07 NOTE — Telephone Encounter (Signed)
Advised pt of cdy recs. Pt verbalized understanding and states she will give that a try and see if it helps. Nothing else was needed

## 2010-05-07 NOTE — Telephone Encounter (Signed)
PW patient. Pt states that for 2 weeks she has had slight sore throat and nasal congestion. Nasal drainage is clear. She denies any cough. Positive for headaches and sinus pressure. Will send to doc of the day, Dr. Maple Hudson for recs.   ALLERGIES:  PCN

## 2010-05-07 NOTE — Telephone Encounter (Signed)
Per CDY-recommend an OTC sinus and allergy medication like Tylenol Allergy and Sinus that has an antihistamine plus decongestant.[name] [name]

## 2010-05-19 LAB — BASIC METABOLIC PANEL
CO2: 28 mEq/L (ref 19–32)
Creatinine, Ser: 0.63 mg/dL (ref 0.4–1.2)
GFR calc non Af Amer: >60 mL/min (ref >60)
Glucose, Bld: 75 mg/dL (ref 70–99)

## 2010-05-19 LAB — POCT HEMOGLOBIN-HEMACUE: Hemoglobin: 14 g/dL (ref 12.0–15.0)

## 2010-06-16 NOTE — Op Note (Signed)
[name] [name], [name] [name]              ACCOUNT NO.:  [id number]   MEDICAL RECORD NO.:  [id number]          PATIENT TYPE:  AMB   LOCATION:  [facility]                          FACILITY:  [facility]   PHYSICIAN:  Katy Fitch. Sypher, M.D. DATE OF BIRTH:  [birth year]   DATE OF PROCEDURE:  DATE OF DISCHARGE:                               OPERATIVE REPORT   PREOPERATIVE DIAGNOSES:  Magnetic resonance imaging-documented  supraspinatus rotator cuff tear, right shoulder with retraction and  chronic pain/weakness.   POSTOPERATIVE DIAGNOSES:  Ulcerated bursal side 99% rotator cuff tear of  entire supraspinatus tendon with unfavorable anterior medial acromial  anatomy and chronic bursitis.  Also noted was a degenerative type 1  superior labrum anterior and posterior lesion without evidence of  significant biceps origin instability.   OPERATION:  1. Examination of right shoulder under anesthesia demonstrating      capsuloligamentous stability.  2. Arthroscopic debridement of labrum, inspection of biceps origin,      and identification of intact bursal fibers and tendon fibers on      articular margin of supraspinatus.  3. Arthroscopic subacromial debridement with bursectomy,      coracoacromial ligament relaxation and anterior medial      acromioplasty.  4. Attempted arthroscopic repair of supraspinatus 2-cm retracted      bursal side 95% to 99% retracted tear, ultimately aborted and      completed with an open hybrid technique with margin convergence and      lateralization due to identification of significant      osteopenia/osteoporosis at greater tuberosity complicating      placement of swivel lock anchors.  Ultimately the repair was      completed with an anterior middle-third deltoid muscle splitting      incision with removal of all the original hardware and      reconstruction utilizing a medial row swivel lock, a margin      convergence running fiber tape, followed by inset with a lateral  swivel lock in cortical bone well below the greater tuberosity.   OPERATING SURGEON:  Katy Fitch. Sypher, MD   ASSISTANT:  [name] Reeks Dasnoit, PA-C   ANESTHESIA:  General by endotracheal technique.   SUPERVISING ANESTHESIOLOGIST:  [name] Beach, MD followed by [name] Mayo, MD   COMPLICATIONS:  We noted rather severe osteopenia of the greater  tuberosity initially identified by the fiber tape suture cutting into  the tuberosity while placing a lateral swivel lock and after the repair  was completed with more lateral row swivel locks, we returned the scope  to the joint and identified a partial loss of fixation of the posterior  medial joint line swivel lock.  Therefore, I elected to completely redo  the repair through a muscle-splitting incision, removing all of the  original anchors, debriding the tendon margins, replacing a new medial  swivel lock anchor at the articular margin of the supraspinatus  insertion just posterior to the biceps and completed a margin  convergence baseball stitch type repair to a lateral cortical swivel  lock.   PROCEDURE:  [name] [name] is a  65 year old right-hand dominant woman  who is a very avid Teacher, English as a foreign language.   She has had a 31-month history of progressive right shoulder pain  aggravated by playing golf and other overhead activities.  She consulted  with me on November 08, 2007, and was thought to have stage II or III  impingement.  We suggested a therapy course.  She did not improve  significantly.  We subsequently suggested an MRI.  The MRI was obtained  at [facility] on February 06, 2008.  [name] L.  Vear Clock, MD the  attending radiologist, interpreted the MRI to reveal a full-thickness  distal supraspinatus tendon insertion tear with medial retraction of 2  cm.  There was a large subacromial subdeltoid bursal fluid collection  and an abnormal inferior lateral acromial tilt narrowing the subacromial  outlet.   I had a lengthy informed consent  with [name] [name] who I am quite  familiar with as we are neighbors we exchanged e-mails regarding  indication for surgery and ultimately I advised her to play golf and  lever the shoulder as long as she was comfortable doing so.   She reached a point where she was uncomfortable more often than not and  elected to proceed with reconstruction of her rotator cuff at this time.   Preoperatively, she was advised the risks and benefits of surgery.  Her  primary care physician is Dr. Adrian [name].   In the holding area, she had an anesthesia consult with Dr. Laverle [name], who placed a right interscalene block without complication.  She  was transferred to room #8 of the [facility], placed in the  supine position on the operating table and under Dr. Michaelle [name] direct  supervision, general anesthesia by endotracheal technique induced.   She was carefully positioned in the [name]-chair position with the aid of  a torso and head holder designed for shoulder arthroscopy.  Examination  of the right shoulder under anesthesia revealed stability to anterior,  posterior, and inferior stress.  She had very minimal adhesive  capsulitis with combined elevation to a 165, external rotation 85, and  internal rotation 70.   The entire right upper extremity and forequarter was prepped with  DuraPrep and draped with impervious arthroscopy drapes.   The procedure commenced with placement of the arthroscope through a  standard posterior viewing portal.  Diagnostic arthroscopy revealed a  type 1 SLAP lesion, some degenerative changes of the labrum, and an  intact subscapularis.  The anterior capsule ligament structures were  studied.  [name] had a Buford type complex with the anterior superior  glenohumeral ligament, not directly originating on the anterior glenoid.  Her long head of the biceps was stable, but there was a partial peel  back test suggesting a partial type 2 SLAP lesion.   A 4.5-mm  suction shaver was brought in anteriorly and used to  decorticate the superior glenoid to create scarring and better adhesion  of the biceps origin.  The deep fibers of the supraspinatus,  infraspinatus were inspected and found to be intact despite the  retracted tear on the MRI.  Her MRI would suggest that she had a 95%  plus bursal surface ulcerated retracted tear.   After completion of the intraarticular debridement, the scope was placed  in the subacromial space and bursectomy accomplished with a full radius  resector.  The ulcerated repair was immediately evident and was about 3  cm in width and retracted 2 cm.  The tendon was extremely necrotic  and  degenerative.  The long head of the biceps was identified.  After  tuberosity, plasty, and cleaning the margins of the tendon, we placed a  pair of swivel locks at the medial aspect of the joint without violating  the remaining fibers.  With great care, we placed the tails of the fiber  tapes through the supraspinatus tendon and the anterior portion the  infraspinatus tendon, taking care to shuttle our sutures out of separate  portals to prevent fouling.  We then performed a crisscross procedure  and placed a posterior swivel lock without difficulty.  We went to place  our final anterior-lateral swivel lock and as we tensioned the swivel  lock, we immediately noted that the fiber tape was cutting into the  greater tuberosity suggesting significant osteopenia, this represents a  pathologic fracture.   Given the circumstance, we then moved further distally on the humerus  into better bone and tried a second swivel lock placement.  Once again,  we noted significant osteopenia.  We consulted the MRI did not find a  cyst.   Finally, I finished the repair with a push lock placed more posteriorly  and distally on the bone.  However, given the multiple attempts to place  the swivel lock and our identification of osteoporosis, I subsequently   placed the scope in the glenohumeral joint and, much to my dismay,  identified that the posterior anchor had backed out partially.   This would correlate with the poor purchase noted with the anterior  lateral swivel lock.  At this point, I elected to abort the arthroscopic  repair out of concern that the speed bridge will fail in the early  postoperative period.  We subsequently removed all the arthroscopic  equipment, performed a 2-1/2 cm anterior deltoid muscle splitting  incision, retrieved the 3 swivel locks, all suture and the push locks  and then carefully replaced a new swivel lock with a fiber tape at the  anterior margin of supraspinatus, carefully protecting the biceps long  head, placing the sutures with a Scorpion through the supraspinatus  anteriorly and creating a baseball stitch with a [name] needle converging  the infraspinatus and supraspinatus with margin convergence followed by  over-the-top technique to a very far distal and lateral swivel locks  through cortical bone.   I probed the greater tuberosity and found the bone to be extremely soft.  To my knowledge, [name] [name] has not been advised that she has a  history of osteopenia or osteoporosis.  However, she has had asthma and  may have been on steroids at some point.   We will research this thoroughly the postoperative.   Our ultimate construct was very stable with good purchase laterally.  A  low profile repair was achieved.   [name] [name] will be protected in a postoperative shoulder mobilizer  with a pillow to prevent inadvertent dislodging of her anchors staff  that there was clearly a complication of difficult fixation ultimately  leading to two complete surgical repairs of the rotator cuff.  The  second repair was quite stable and sound and it should be very  serviceable.   [name] [name] wounds was irrigated with the arthroscopic pump,  followed by repair of the deltoid with an apical mattress  suture of #2  FiberWire and finishing sutures of 0 Vicryl.  The skin was repaired with  subcutaneous suture of 2-0 Vicryl and intradermal 3-0 Prolene.  The  portals repaired with intradermal 3-0 Prolene.  She was awakened from general anesthesia and transferred to the recovery  room with stable signs.  We will [facility]  overnight for observation of her vital signs and analgesics in the form  of p.o. and IV Dilaudid and p.o. ibuprofen.       Katy Fitch Sypher, M.D.  Electronically Signed     RVS/MEDQ  D:  03/14/2008  T:  03/15/2008  Job:  [id number]   cc:   Jeannett Senior A. Evlyn Kanner, M.D.

## 2010-06-19 NOTE — Assessment & Plan Note (Signed)
[facility]                             PULMONARY OFFICE NOTE   NAME:[name], [name] SOUDERS                     MRN:          [id number]  DATE:04/14/2006                            DOB:          Jun 07, 1945    HISTORY OF PRESENT ILLNESS:  The patient is a 65 year old white female  patient of Dr. Delford Field, who has a known history of asthmatic bronchitis  and rhinitis, presents for an acute office visit.  Patient complains of  a six-day history of nasal congestion, postnasal drip, cough and chest  congestion.  Patient denies any hemoptysis, chest pain, recent travel or  antibiotic use.   PAST MEDICAL HISTORY:  Reviewed.   CURRENT MEDICATIONS:  Reviewed.   PHYSICAL EXAM:  Patient is a pleasant female, in no acute distress.  She  is afebrile with stable vital signs.  Her O2 saturation is 100% on room  air.  HEENT:  Nasal mucosa slightly pale.  __________ .  Posterior pharynx is  clear.  NECK:  Supple without adenopathy.  No JVD.  LUNG SOUNDS:  Clear.  CARDIAC:  Regular rate.  ABDOMEN:  Soft and nontender.  EXTREMITIES:  Warm without any edema.   IMPRESSION AND PLAN:  Acute upper respiratory infection with a mild  asthmatic flare.  Patient is to begin a Z-Pak.  Mucinex-DM twice daily.  Patient is to increase Asmanex up to 2 puffs daily over the next 5 days  and then return to her baseline of 1 puff daily.  Patient is to return  back with Dr. Delford Field as scheduled, or sooner if needed.      Rubye Oaks, NP  Electronically Signed      Charlcie Cradle Delford Field, MD, [facility]  Electronically Signed   TP/MedQ  DD: 04/14/2006  DT: 04/15/2006  Job #: [id number]

## 2010-06-19 NOTE — Op Note (Signed)
[facility]  Patient:    [name] [name], [name] [name]                     MRN: [id number] Proc. Date: 05/23/00 Adm. Date:  [id number] Attending:  Brynda Peon                           Operative Report  PREOPERATIVE DIAGNOSES:  Postmenopausal bleeding, large submucous myoma.  POSTOPERATIVE DIAGNOSES:  Postmenopausal bleeding, large submucous myoma. Pathology pending.  PROCEDURE:  Hysteroscopy, resection of submucous myoma, dilation and curettage.  SURGEON:  Cynthia P. Ashley Royalty, M.D.  ANESTHESIA:  General by LMA.  ESTIMATED BLOOD LOSS:  50 cc.  COMPLICATIONS:  None.  DESCRIPTION OF PROCEDURE:  The patient was taken to the operating room and after the induction of adequate general anesthesia by LMA, was placed in the dorsal lithotomy position and prepped and draped in the usual fashion.  The bladder was drained with a red rubber catheter.  The cervix was grasped off the anterior and posterior lips with the tenaculum.  It was sounded to 2.75 inches.  The cervix was dilated serially to #31 Shawnie Pons.  The operative resectoscope was introduced.  Sorbitol was used as a distension medium.  The pump pressure was set at 70 mmHg.  A large submucous myoma was noted.  It appeared to be approximately 2 cm as was predicted by the sonohysterogram. The loop was used to resect the myoma in pieces.  Cautery was then used to cauterize the base of the myoma after the resection for hemostasis.  The specimens were sent to pathology.  The patient was taken to the recovery room in good condition.  She tolerated it well. DD:  05/23/00 TD:  05/22/00 Job: [id number]

## 2011-03-17 ENCOUNTER — Ambulatory Visit: Payer: PRIVATE HEALTH INSURANCE | Admitting: Critical Care Medicine

## 2011-04-02 ENCOUNTER — Ambulatory Visit: Payer: PRIVATE HEALTH INSURANCE | Admitting: Critical Care Medicine

## 2011-04-12 ENCOUNTER — Other Ambulatory Visit: Payer: Self-pay | Admitting: Critical Care Medicine

## 2011-05-12 ENCOUNTER — Ambulatory Visit: Payer: PRIVATE HEALTH INSURANCE | Admitting: Critical Care Medicine

## 2011-05-20 ENCOUNTER — Encounter: Payer: Self-pay | Admitting: *Deleted

## 2011-05-20 ENCOUNTER — Ambulatory Visit (INDEPENDENT_AMBULATORY_CARE_PROVIDER_SITE_OTHER): Payer: PRIVATE HEALTH INSURANCE | Admitting: Adult Health

## 2011-05-20 VITALS — BP 118/74 | HR 56 | Temp 97.9°F | Ht 62.5 in | Wt 123.2 lb

## 2011-05-20 DIAGNOSIS — J45909 Unspecified asthma, uncomplicated: Secondary | ICD-10-CM

## 2011-05-20 NOTE — Assessment & Plan Note (Addendum)
Excellent Control of her asthma. Patient is continued on Asmanex daily. Patient will follow back up with Dr. Delford Field in one year and as needed.

## 2011-05-20 NOTE — Progress Notes (Signed)
Subjective:    Patient ID: [name] [name], female    DOB: 04/19/1945, 66 y.o.   MRN: [id number]  HPI  66 year old, white female, with mild intermittent asthma.    January 12, 2008 --  No recent changes. No complaints. Not using rescue inhaler.   February 21, 2009--Presents for asthma flare: tightness in chest, increased SOB x3-4weeks - denies cough, wheezing. Has felt lightheaded over last few days. C/O if she takes in real deep breath she feels lightheaded. Lightheaded if she stands up. B/P is low today in office.   March 20, 2009 --Pt was seen 02/21/09. No new issues.   December 17, 2009--Presents for an acute office visit. Complains of cough, congestion. He was given a zpak then avelox by Dr. Evlyn Kanner for cough and sinus infection. finished avelox 5days ago, still having dry cough, chest congestion, occ wheezing and SOB x2weeks. She is feeling better w/ decreased congestion but cough has not totally resolved and has some wheezing. Denies chest pain, , orthopnea, hemoptysis, fever, n/v/d, edema, headache.   March 05, 2010 11:50 AM  No prob since 11/11 ov.  Now: nor real cough, occ dry cough, no wheeze, no pn drip    05/20/2011 Follow up  Patient returns for a one-year followup. Patient reports, that she's been doing very well. She uses her Asmanex one puff daily. She's had no flare in her asthma symptoms. She has not use her rescue inhaler. Any over the last year. She reports that her activity tolerance is very well years. She denies any chest pain, shortness of breath, nocturnal symptoms or wheezing.    Past Medical History:  Current Problems:  ASTHMA, INTERMITTENT, MILD (ICD-493.90)  -FeV1 96% 2004  ASTHMATIC BRONCHITIS, ACUTE (ICD-466.0)  RHINITIS, VASOMOTOR (ICD-477.9)   Review of Systems Constitutional:   No  weight loss, night sweats,  Fevers, chills, fatigue, or  lassitude.  HEENT:   No headaches,  Difficulty swallowing,  Tooth/dental problems, or  Sore throat,              No sneezing, itching, ear ache, nasal congestion, post nasal drip,   CV:  No chest pain,  Orthopnea, PND, swelling in lower extremities, anasarca, dizziness, palpitations, syncope.   GI  No heartburn, indigestion, abdominal pain, nausea, vomiting, diarrhea, change in bowel habits, loss of appetite, bloody stools.   Resp: No shortness of breath with exertion or at rest.  No excess mucus, no productive cough,  No non-productive cough,  No coughing up of blood.  No change in color of mucus.  No wheezing.  No chest wall deformity  Skin: no rash or lesions.  GU: no dysuria, change in color of urine, no urgency or frequency.  No flank pain, no hematuria   MS:  No joint pain or swelling.  No decreased range of motion.  No back pain.  Psych:  No change in mood or affect. No depression or anxiety.  No memory loss.         Objective:   Physical Exam GEN: A/Ox3; pleasant , NAD, well nourished   HEENT:  [place]/AT,  EACs-clear, TMs-wnl, NOSE-clear, THROAT-clear, no lesions, no postnasal drip or exudate noted.   NECK:  Supple w/ fair ROM; no JVD; normal carotid impulses w/o bruits; no thyromegaly or nodules palpated; no lymphadenopathy.  RESP  Clear  P & A; w/o, wheezes/ rales/ or rhonchi.no accessory muscle use, no dullness to percussion  CARD:  RRR, no m/r/g  , no peripheral edema, pulses intact, no cyanosis  or clubbing.  GI:   Soft & nt; nml bowel sounds; no organomegaly or masses detected.  Musco: Warm bil, no deformities or joint swelling noted.   Neuro: alert, no focal deficits noted.    Skin: Warm, no lesions or rashes         Assessment & Plan:

## 2011-05-20 NOTE — Patient Instructions (Signed)
Continue on current regimen.  Keep up good work .  follow up Dr. Delford Field  In 1 year and As needed

## 2011-05-31 ENCOUNTER — Other Ambulatory Visit: Payer: Self-pay | Admitting: Obstetrics and Gynecology

## 2011-05-31 DIAGNOSIS — Z1231 Encounter for screening mammogram for malignant neoplasm of breast: Secondary | ICD-10-CM

## 2011-06-11 ENCOUNTER — Ambulatory Visit
Admission: RE | Admit: 2011-06-11 | Discharge: 2011-06-11 | Disposition: A | Discharge status: Home / Self Care | Payer: PRIVATE HEALTH INSURANCE | Source: Ambulatory Visit | Attending: Obstetrics and Gynecology | Admitting: Obstetrics and Gynecology

## 2011-06-11 DIAGNOSIS — Z1231 Encounter for screening mammogram for malignant neoplasm of breast: Secondary | ICD-10-CM

## 2011-08-04 ENCOUNTER — Other Ambulatory Visit: Payer: Self-pay | Admitting: Critical Care Medicine

## 2011-10-05 ENCOUNTER — Other Ambulatory Visit: Payer: Self-pay | Admitting: Critical Care Medicine

## 2011-12-05 ENCOUNTER — Other Ambulatory Visit: Payer: Self-pay | Admitting: Critical Care Medicine

## 2012-02-02 DIAGNOSIS — Z923 Personal history of irradiation: Secondary | ICD-10-CM

## 2012-02-02 HISTORY — DX: Personal history of irradiation: Z92.3

## 2012-03-20 ENCOUNTER — Encounter: Payer: Self-pay | Admitting: Obstetrics and Gynecology

## 2012-04-18 ENCOUNTER — Other Ambulatory Visit: Payer: Self-pay | Admitting: Critical Care Medicine

## 2012-04-18 ENCOUNTER — Telehealth: Payer: Self-pay | Admitting: Critical Care Medicine

## 2012-04-18 MED ORDER — MOMETASONE FUROATE 220 MCG/INH IN AEPB: INHALATION_SPRAY | RESPIRATORY_TRACT | Status: DC

## 2012-04-18 NOTE — Telephone Encounter (Signed)
I spoke with pt and is aware RX has been sent. She voiced her understanding and needed nothing further

## 2012-05-02 ENCOUNTER — Ambulatory Visit: Payer: Self-pay | Admitting: Certified Nurse Midwife

## 2012-05-02 ENCOUNTER — Other Ambulatory Visit: Payer: Self-pay | Admitting: Obstetrics and Gynecology

## 2012-05-02 NOTE — Telephone Encounter (Signed)
Annual exam 05/4012

## 2012-05-09 ENCOUNTER — Ambulatory Visit: Payer: Self-pay | Admitting: Certified Nurse Midwife

## 2012-05-12 ENCOUNTER — Other Ambulatory Visit: Payer: Self-pay | Admitting: *Deleted

## 2012-05-24 ENCOUNTER — Other Ambulatory Visit: Payer: Self-pay

## 2012-05-24 ENCOUNTER — Encounter: Payer: Self-pay | Admitting: Certified Nurse Midwife

## 2012-05-24 ENCOUNTER — Ambulatory Visit (INDEPENDENT_AMBULATORY_CARE_PROVIDER_SITE_OTHER): Payer: PRIVATE HEALTH INSURANCE | Admitting: Certified Nurse Midwife

## 2012-05-24 VITALS — BP 120/64 | Ht 62.25 in | Wt 121.0 lb

## 2012-05-24 DIAGNOSIS — Z01419 Encounter for gynecological examination (general) (routine) without abnormal findings: Secondary | ICD-10-CM

## 2012-05-24 DIAGNOSIS — N8111 Cystocele, midline: Secondary | ICD-10-CM

## 2012-05-24 DIAGNOSIS — Z0000 Encounter for general adult medical examination without abnormal findings: Secondary | ICD-10-CM

## 2012-05-24 DIAGNOSIS — IMO0002 Reserved for concepts with insufficient information to code with codable children: Secondary | ICD-10-CM | POA: Insufficient documentation

## 2012-05-24 DIAGNOSIS — Z1231 Encounter for screening mammogram for malignant neoplasm of breast: Secondary | ICD-10-CM

## 2012-05-24 LAB — POCT URINALYSIS DIPSTICK
Bilirubin, UA: NEGATIVE
Blood, UA: NEGATIVE
Protein, UA: NEGATIVE

## 2012-05-24 NOTE — Progress Notes (Signed)
67 y.o. MarriedCaucasian female   G2P2 here for annual exam. Menopausal on HRT. Feels so much better on.  Denies any vaginal bleeding. Using Estrace cream not as directed, but having dryness and discomfort. " Doesn't like the mess"  Cystocele still a problem with increasing pressure with standing or sitting, but not ready for any surgery.  Had recent visit PCP all labs great, brought lab copy in. Thyroid medication stable. Hypertension stable on medication.  No health issues today.   Patient's last menstrual period was 01/02/2000.          Sexually active: yes  The current method of family planning is post menopausal status.    Exercising: yes  aerobics & weights Last mammogram: 06/11/11 Last pap: 03/31/10 Last BMD: 2011 Alcohol: 5-7 a week Tobacco: none Colonoscopy:2004 poct urine: neg Hgb: 12.9  Health Maintenance  Topic Date Due  . Tetanus/tdap  12/02/1963  . Colonoscopy  12/02/1994  . Zostavax  12/01/2004  . Pneumococcal Polysaccharide Vaccine Age 38 And Over  12/01/2009  . Influenza Vaccine  10/02/2012  . Mammogram  06/10/2013    Family History  Problem Relation Age of Onset  . Aneurysm Mother   . Hypertension Sister     Patient Active Problem List  Diagnosis  . HYPERTENSION  . RHINITIS, VASOMOTOR  . ASTHMA, INTERMITTENT, MILD    Past Medical History  Diagnosis Date  . Asthma, mild intermittent   . Acute asthmatic bronchitis   . Vasomotor rhinitis   . Hypothyroidism   . Diverticulosis 2004  . Heart murmur     nl echo  . Fibroids 1998  . History of TMJ disorder   . Fracture of fibula 2007    left;  hit with golf ball  . Hypertension   . Cystocele     Grade 2  . Osteopenia 2002    Past Surgical History  Procedure Laterality Date  . Thyroidectomy    . Minor breast biopsy Left     times two  . Rotator cuff repair Right 2010  . Hysteroscopy with resectoscope  2002    submucous fibroid    Allergies: Penicillins and Pneumovax  Current Outpatient  Prescriptions  Medication Sig Dispense Refill  . amLODipine-atorvastatin (CADUET) 5-10 MG per tablet Take 1 tablet by mouth daily.      Marland Kitchen aspirin 81 MG tablet Take 81 mg by mouth daily.      . calcium-vitamin D (OSCAL WITH D) 500-200 MG-UNIT per tablet Take 1 tablet by mouth daily.      . cholecalciferol (VITAMIN D) 1000 UNITS tablet Take 1,000 Units by mouth 2 (two) times a week.      . estradiol-norethindrone (ACTIVELLA) 1-0.5 MG per tablet Take 1 tablet by mouth. 6 times a week      . levothyroxine (SYNTHROID, LEVOTHROID) 75 MCG tablet Take 75 mcg by mouth daily.      . mometasone (ASMANEX 60 METERED DOSES) 220 MCG/INH inhaler Inhale 1 puff by mouth daily as directed  1 Inhaler  3   No current facility-administered medications for this visit.    ROS: Pertinent items are noted in HPI.  Exam:    BP 120/64  Ht 5' 2.25" (1.581 m)  Wt 121 lb (54.885 kg)  BMI 21.96 kg/m2  LMP 01/02/2000 Weight change: @WEIGHTCHANGE @ Last 3 height recordings:  Ht Readings from Last 3 Encounters:  05/24/12 5' 2.25" (1.581 m)  05/20/11 5' 2.5" (1.588 m)  03/05/10 5' 2.5" (1.588 m)   General appearance: alert  and cooperative Head: Normocephalic, without obvious abnormality, atraumatic Neck: no adenopathy, supple, symmetrical, trachea midline and thyroid not enlarged, symmetric, no tenderness/mass/nodules Lungs: clear to auscultation bilaterally Breasts: normal appearance, no masses or tenderness, No nipple retraction or dimpling Heart: regular rate and rhythm Abdomen: soft, non-tender; bowel sounds normal; no masses,  no organomegaly Extremities: extremities normal, atraumatic, no cyanosis or edema Skin: Skin color, texture, turgor normal. No rashes or lesions Lymph nodes: Cervical, supraclavicular, and axillary nodes normal. no inguinal nodes palpated Neurologic: Alert and oriented X 3, normal strength and tone. Normal symmetric reflexes. Normal coordination and gait   Pelvic: External genitalia:   no lesions and atrophic appearance              Urethra: normal appearing urethra with no masses, tenderness or lesions              Bartholins and Skenes: Bartholin's, Urethra, Skene's normal                 Vagina: normal appearing vagina with normal color and discharge, no lesions, slightly atrophic, cystocele gr 3              Cervix: normal appearance              Pap taken: no        Bimanual Exam:  Uterus:  uterus is normal size, shape, consistency and nontender                                      Adnexa:    normal adnexa in size, nontender and no masses                                      Rectovaginal: Confirms                                      Anus:  normal sphincter tone, no lesions   A: Normal well woman exam Menopausal on HRT desires continuance Atrophic vaginitis using Estrace Cream desires change Cystocele grade 3 Hypertension and Hypothyroid  On stable medication   P: Reviewed health and wellness pertinent to exam Aware of need to advise if vaginal bleeding Colonoscopy due this year, will schedule through PCP Discussed risks and benefits of HRT(patient only takes 6 days a week) patient desires continuance   Rx Activella see order DIscussed Vagifem use instead of Estrace cream, lower dosage, more convenient given sample to try with instructions, will advise if desires continuance.  Stop Estrace cream Discussed pessary support for cystocele, benefits, self management and will decrease vaginal pressure.  Patient shown pessary.  Will decide if should would like to have fitting and schedule. Pap yearly or as indicated Mammogram yearly  RV annually, prn or as above           An After Visit Summary was printed and given to the patient.  Reviewed, TL

## 2012-05-24 NOTE — Patient Instructions (Addendum)

## 2012-06-07 ENCOUNTER — Ambulatory Visit (INDEPENDENT_AMBULATORY_CARE_PROVIDER_SITE_OTHER): Payer: PRIVATE HEALTH INSURANCE | Admitting: Critical Care Medicine

## 2012-06-07 ENCOUNTER — Encounter: Payer: Self-pay | Admitting: Critical Care Medicine

## 2012-06-07 VITALS — BP 120/80 | HR 60 | Temp 98.1°F | Ht 62.5 in | Wt 121.0 lb

## 2012-06-07 DIAGNOSIS — J45909 Unspecified asthma, uncomplicated: Secondary | ICD-10-CM

## 2012-06-07 MED ORDER — MOMETASONE FUROATE 220 MCG/INH IN AEPB: INHALATION_SPRAY | RESPIRATORY_TRACT | Status: DC

## 2012-06-07 NOTE — Assessment & Plan Note (Signed)
Mild intermittent asthma stable at this time Plan Maintain inhaled corticosteroid Asmanex 1 puff daily Return 1 year

## 2012-06-07 NOTE — Progress Notes (Signed)
Subjective:    Patient ID: [name] [name], female    DOB: 05/24/1945, 67 y.o.   MRN: [id number]  HPI   67 y.o. white female, with mild intermittent asthma.    06/07/2012 Now flare ups over the past year.   No real cough. No rescue inhaler use PUL ASTHMA HISTORY 06/07/2012  Symptoms 0-2 days/week  Nighttime awakenings 0-2/month  Interference with activity No limitations  SABA use 0-2 days/wk  Exacerbations requiring oral steroids 0-1 / year      Past Medical History  Diagnosis Date  . Asthma, mild intermittent   . Acute asthmatic bronchitis   . Vasomotor rhinitis   . Hypothyroidism   . Diverticulosis 2004  . Heart murmur     nl echo  . Fibroids 1998  . History of TMJ disorder   . Fracture of fibula 2007    left;  hit with golf ball  . Hypertension   . Cystocele     Grade 2  . Osteopenia 2002     Family History  Problem Relation Age of Onset  . Aneurysm Mother   . Hypertension Sister      History   Social History  . Marital Status: Married    Spouse Name: N/A    Number of Children: 2  . Years of Education: N/A   Occupational History  . homemaker    Social History Main Topics  . Smoking status: Never Smoker   . Smokeless tobacco: Not on file  . Alcohol Use: 3 - 4.2 oz/week    5-7 Cans of beer per week     Comment: social  . Drug Use: No  . Sexually Active: Yes -- Female partner(s)    Birth Control/ Protection: Post-menopausal   Other Topics Concern  . Not on file   Social History Narrative  . No narrative on file     Allergies  Allergen Reactions  . Penicillins Hives  . Pneumovax (Pneumococcal Polysaccharides) Swelling     Outpatient Prescriptions Prior to Visit  Medication Sig Dispense Refill  . amLODipine-atorvastatin (CADUET) 5-10 MG per tablet Take 1 tablet by mouth daily.      Marland Kitchen aspirin 81 MG tablet Take 81 mg by mouth daily.      . calcium-vitamin D (OSCAL WITH D) 500-200 MG-UNIT per tablet Take 1 tablet by mouth daily.      .  cholecalciferol (VITAMIN D) 1000 UNITS tablet Take 1,000 Units by mouth 2 (two) times a week.      . estradiol-norethindrone (ACTIVELLA) 1-0.5 MG per tablet Take 1 tablet by mouth. 6 times a week      . levothyroxine (SYNTHROID, LEVOTHROID) 75 MCG tablet Take 75 mcg by mouth daily.      . mometasone (ASMANEX 60 METERED DOSES) 220 MCG/INH inhaler Inhale 1 puff by mouth daily as directed  1 Inhaler  3   No facility-administered medications prior to visit.     Review of Systems  Constitutional:   No  weight loss, night sweats,  Fevers, chills, fatigue, or  lassitude.  HEENT:   No headaches,  Difficulty swallowing,  Tooth/dental problems, or  Sore throat,                No sneezing, itching, ear ache, nasal congestion, post nasal drip,   CV:  No chest pain,  Orthopnea, PND, swelling in lower extremities, anasarca, dizziness, palpitations, syncope.   GI  No heartburn, indigestion, abdominal pain, nausea, vomiting, diarrhea, change in bowel habits, loss  of appetite, bloody stools.   Resp: No shortness of breath with exertion or at rest.  No excess mucus, no productive cough,  No non-productive cough,  No coughing up of blood.  No change in color of mucus.  No wheezing.  No chest wall deformity  Skin: no rash or lesions.  GU: no dysuria, change in color of urine, no urgency or frequency.  No flank pain, no hematuria   MS:  No joint pain or swelling.  No decreased range of motion.  No back pain.  Psych:  No change in mood or affect. No depression or anxiety.  No memory loss.         Objective:   Physical Exam  Filed Vitals:   06/07/12 1123  BP: 120/80  Pulse: 60  Temp: 98.1 F (36.7 C)  TempSrc: Oral  Height: 5' 2.5" (1.588 m)  Weight: 121 lb (54.885 kg)  SpO2: 99%    Gen: Pleasant, well-nourished, in no distress,  normal affect  ENT: No lesions,  mouth clear,  oropharynx clear, no postnasal drip  Neck: No JVD, no TMG, no carotid bruits  Lungs: No use of accessory  muscles, no dullness to percussion, clear without rales or rhonchi  Cardiovascular: RRR, heart sounds normal, no murmur or gallops, no peripheral edema  Abdomen: soft and NT, no HSM,  BS normal  Musculoskeletal: No deformities, no cyanosis or clubbing  Neuro: alert, non focal  Skin: Warm, no lesions or rashes  No results found.       Assessment & Plan:   ASTHMA, INTERMITTENT, MILD Mild intermittent asthma stable at this time Plan Maintain inhaled corticosteroid Asmanex 1 puff daily Return 1 year   Updated Medication List Outpatient Encounter Prescriptions as of 06/07/2012  Medication Sig Dispense Refill  . albuterol (PROVENTIL HFA;VENTOLIN HFA) 108 (90 BASE) MCG/ACT inhaler Inhale 1 puff into the lungs every 6 (six) hours as needed for wheezing or shortness of breath.      Marland Kitchen amLODipine-atorvastatin (CADUET) 5-10 MG per tablet Take 1 tablet by mouth daily.      Marland Kitchen aspirin 81 MG tablet Take 81 mg by mouth daily.      . calcium-vitamin D (OSCAL WITH D) 500-200 MG-UNIT per tablet Take 1 tablet by mouth daily.      . cholecalciferol (VITAMIN D) 1000 UNITS tablet Take 1,000 Units by mouth 2 (two) times a week.      . estradiol-norethindrone (ACTIVELLA) 1-0.5 MG per tablet Take 1 tablet by mouth. 6 times a week      . levothyroxine (SYNTHROID, LEVOTHROID) 75 MCG tablet Take 75 mcg by mouth daily.      . mometasone (ASMANEX 60 METERED DOSES) 220 MCG/INH inhaler Inhale 1 puff by mouth daily as directed  1 Inhaler  11  . [DISCONTINUED] mometasone (ASMANEX 60 METERED DOSES) 220 MCG/INH inhaler Inhale 1 puff by mouth daily as directed  1 Inhaler  3   No facility-administered encounter medications on file as of 06/07/2012.

## 2012-06-07 NOTE — Patient Instructions (Signed)
No change in medications. Return in       12 months  

## 2012-06-15 ENCOUNTER — Telehealth: Payer: Self-pay | Admitting: *Deleted

## 2012-06-15 NOTE — Telephone Encounter (Signed)
Last annual 05/24/12 Last fill 06/07/12 Next annual 06/02/13  Chart in your office

## 2012-06-16 ENCOUNTER — Other Ambulatory Visit: Payer: Self-pay | Admitting: *Deleted

## 2012-06-16 MED ORDER — ESTRADIOL-NORETHINDRONE ACET 1-0.5 MG PO TABS: 1.0000 | ORAL_TABLET | Freq: Every day | ORAL | Status: DC

## 2012-06-16 NOTE — Telephone Encounter (Signed)
Rx sent e-mail to [facility] for estra-noreth  Take one tablet po qd. sue

## 2012-06-20 ENCOUNTER — Ambulatory Visit: Payer: PRIVATE HEALTH INSURANCE

## 2012-06-20 ENCOUNTER — Ambulatory Visit
Admission: RE | Admit: 2012-06-20 | Discharge: 2012-06-20 | Disposition: A | Discharge status: Home / Self Care | Payer: PRIVATE HEALTH INSURANCE | Source: Ambulatory Visit

## 2012-06-20 DIAGNOSIS — Z1231 Encounter for screening mammogram for malignant neoplasm of breast: Secondary | ICD-10-CM

## 2012-06-21 ENCOUNTER — Encounter: Payer: Self-pay | Admitting: Internal Medicine

## 2012-06-21 ENCOUNTER — Other Ambulatory Visit: Payer: Self-pay | Admitting: Obstetrics and Gynecology

## 2012-06-22 ENCOUNTER — Other Ambulatory Visit: Payer: Self-pay | Admitting: Obstetrics and Gynecology

## 2012-06-22 DIAGNOSIS — R928 Other abnormal and inconclusive findings on diagnostic imaging of breast: Secondary | ICD-10-CM

## 2012-06-23 ENCOUNTER — Encounter: Payer: Self-pay | Admitting: Internal Medicine

## 2012-07-02 DIAGNOSIS — D0511 Intraductal carcinoma in situ of right breast: Secondary | ICD-10-CM | POA: Insufficient documentation

## 2012-07-02 DIAGNOSIS — C801 Malignant (primary) neoplasm, unspecified: Secondary | ICD-10-CM | POA: Insufficient documentation

## 2012-07-05 ENCOUNTER — Ambulatory Visit
Admission: RE | Admit: 2012-07-05 | Discharge: 2012-07-05 | Disposition: A | Discharge status: Home / Self Care | Payer: PRIVATE HEALTH INSURANCE | Source: Ambulatory Visit | Attending: Obstetrics and Gynecology | Admitting: Obstetrics and Gynecology

## 2012-07-05 DIAGNOSIS — R928 Other abnormal and inconclusive findings on diagnostic imaging of breast: Secondary | ICD-10-CM

## 2012-07-05 HISTORY — PX: BREAST BIOPSY: SHX20

## 2012-07-06 ENCOUNTER — Telehealth: Payer: Self-pay | Admitting: *Deleted

## 2012-07-06 ENCOUNTER — Other Ambulatory Visit: Payer: Self-pay | Admitting: Obstetrics and Gynecology

## 2012-07-06 DIAGNOSIS — C50911 Malignant neoplasm of unspecified site of right female breast: Secondary | ICD-10-CM

## 2012-07-06 NOTE — Telephone Encounter (Signed)
SEE RESULTS IN YOUR CABINET FROM THE BRE[facility]ENTER. [name] [name]

## 2012-07-06 NOTE — Telephone Encounter (Signed)
.  FORM FOR PRECERTIFICATION FOR BILATERAL BREAST MRI BE DONE BY REFERRING PHYSICIAN PRIOR TO PROCEDURE. SUE FORM GIVEN TO CAROLYN.

## 2012-07-10 ENCOUNTER — Ambulatory Visit
Admission: RE | Admit: 2012-07-10 | Discharge: 2012-07-10 | Disposition: A | Discharge status: Home / Self Care | Payer: PRIVATE HEALTH INSURANCE | Source: Ambulatory Visit | Attending: Obstetrics and Gynecology | Admitting: Obstetrics and Gynecology

## 2012-07-10 DIAGNOSIS — C50911 Malignant neoplasm of unspecified site of right female breast: Secondary | ICD-10-CM

## 2012-07-10 MED ORDER — GADOBENATE DIMEGLUMINE 529 MG/ML IV SOLN
11.0000 mL | Freq: Once | INTRAVENOUS | Status: AC | PRN
  Administered 2012-07-10: 11 mL via INTRAVENOUS

## 2012-07-14 ENCOUNTER — Telehealth: Payer: Self-pay | Admitting: Obstetrics and Gynecology

## 2012-07-14 NOTE — Telephone Encounter (Signed)
Message copied by Alisa Graff on Fri Jul 14, 2012 11:50 AM ------      Message from: Jerene Bears      Created: Thu Jul 13, 2012  9:49 PM       Please let pt know we have gotten results of recent bx showing DCIS--ductal carcinoma in situ.  She needs to stop her HRT.  She may need consult to discuss other options for controlling symptoms. ------

## 2012-07-14 NOTE — Telephone Encounter (Signed)
Call to patient with message from Dr Hyacinth Meeker to discontinue HRT.  Patient had actually called herself to let us know this as well.  Discussed with patient we definitely recommend discontinue HRT, she has stopped this 2 days ago.  Discussed that if she has side effects that are bothersome to her that she can come in and discuss other treatment options.  She will call if needed as she just wants to get thru these initial appointments.  Had MRI and "dot showed up there as well.  Second biopsy is to be scheduled but they expect this is a cyst.  Initial breast cancer is on the right.  Patient states she is coping well, support given and encouraged to call PRN.

## 2012-07-14 NOTE — Telephone Encounter (Signed)
Pt was recently diagnosed with Breast Cancer(1 wk ago) seeing a Dr. Greggory Stallion Aide at [facility] was told to stop Taking all hormones. Pt just wanted Dr. Tresa Res and Sara Chu to know, and to see what to do if anything And if there is something else OTC she can use in replace of this.

## 2012-07-14 NOTE — Telephone Encounter (Signed)
Patient was recently Dx with Breast Cancer and is concern that she was taken off of her harmons.

## 2012-08-01 DIAGNOSIS — C50919 Malignant neoplasm of unspecified site of unspecified female breast: Secondary | ICD-10-CM | POA: Insufficient documentation

## 2012-08-01 HISTORY — DX: Malignant neoplasm of unspecified site of unspecified female breast: C50.919

## 2012-08-09 DIAGNOSIS — R011 Cardiac murmur, unspecified: Secondary | ICD-10-CM | POA: Insufficient documentation

## 2012-08-09 DIAGNOSIS — E039 Hypothyroidism, unspecified: Secondary | ICD-10-CM | POA: Insufficient documentation

## 2012-08-10 HISTORY — PX: BREAST LUMPECTOMY: SHX2

## 2012-09-06 ENCOUNTER — Other Ambulatory Visit: Payer: Self-pay

## 2012-09-12 ENCOUNTER — Encounter: Payer: Self-pay | Admitting: Obstetrics and Gynecology

## 2012-09-12 ENCOUNTER — Ambulatory Visit (INDEPENDENT_AMBULATORY_CARE_PROVIDER_SITE_OTHER): Payer: PRIVATE HEALTH INSURANCE | Admitting: Obstetrics and Gynecology

## 2012-09-12 VITALS — BP 140/78 | HR 68 | Resp 16 | Wt 119.0 lb

## 2012-09-12 DIAGNOSIS — D0590 Unspecified type of carcinoma in situ of unspecified breast: Secondary | ICD-10-CM

## 2012-09-12 DIAGNOSIS — D0511 Intraductal carcinoma in situ of right breast: Secondary | ICD-10-CM

## 2012-09-12 DIAGNOSIS — D0510 Intraductal carcinoma in situ of unspecified breast: Secondary | ICD-10-CM | POA: Insufficient documentation

## 2012-09-12 DIAGNOSIS — N951 Menopausal and female climacteric states: Secondary | ICD-10-CM

## 2012-09-12 NOTE — Progress Notes (Signed)
67 yo MWF G2P2 diagnosed with right breast DCIS with lumpectomy in July 2014.  Will be starting RT soon.  Here to see what can be done for her hot flashes and vaginal dryness, and difficulty sleeping.  Hot flashes about twice a day, and one or two times each night.  Offered Effexor or SSRI but pt doesn't want to start those at this point.  Has ambien from Dr. Evlyn Kanner and that works well for sleep.  Encouraged to use olive oil for vag dryness.  Currently puts on KY jelly after her bath to lubricate her introitus and prevent chafing of her cystocele.  Will change to using olive oil since it is more moisturizing.  Questions invited and answered.    Cont routine care, with DL in April 4540.

## 2012-09-12 NOTE — Patient Instructions (Addendum)
Routine office visit next spring with Ms. [name] [name]

## 2012-09-19 ENCOUNTER — Institutional Professional Consult (permissible substitution): Payer: PRIVATE HEALTH INSURANCE | Admitting: Obstetrics & Gynecology

## 2012-09-20 NOTE — Progress Notes (Signed)
Location of Breast Cancer: right breast, upper central  Histology per Pathology Report: Breast, right, needle core biopsy, upper, central - DUCTAL CARCINOMA IN SITU, ASSOCIATED WITH MICROCALCIFICATIONS  Receptor Status: ER(100% positive), PR (95% positive), Her2-neu (na)  Did patient present with symptoms (if so, please note symptoms) or was this found on screening mammography?: screening mammography  Past/Anticipated interventions by surgeon, if any: right lumpectomy done 08/10/12  Past/Anticipated interventions by medical oncology, if any: Chemotherapy , no oncology appointment in Epic  Lymphedema issues, if any:   none  Pain issues, if any:  none  SAFETY ISSUES:  Prior radiation? no  Pacemaker/ICD? no  Possible current pregnancy? no  Is the patient on methotrexate? no  Current Complaints / other details:  Married, homemaker

## 2012-09-21 ENCOUNTER — Ambulatory Visit
Admission: RE | Admit: 2012-09-21 | Discharge: 2012-09-21 | Disposition: A | Discharge status: Home / Self Care | Payer: PRIVATE HEALTH INSURANCE | Source: Ambulatory Visit | Attending: Radiation Oncology | Admitting: Radiation Oncology

## 2012-09-21 ENCOUNTER — Encounter: Payer: Self-pay | Admitting: Radiation Oncology

## 2012-09-21 VITALS — BP 142/61 | HR 58 | Temp 98.1°F | Resp 20 | Wt 120.9 lb

## 2012-09-21 DIAGNOSIS — J45909 Unspecified asthma, uncomplicated: Secondary | ICD-10-CM | POA: Insufficient documentation

## 2012-09-21 DIAGNOSIS — M899 Disorder of bone, unspecified: Secondary | ICD-10-CM | POA: Insufficient documentation

## 2012-09-21 DIAGNOSIS — Z79899 Other long term (current) drug therapy: Secondary | ICD-10-CM | POA: Insufficient documentation

## 2012-09-21 DIAGNOSIS — I10 Essential (primary) hypertension: Secondary | ICD-10-CM | POA: Insufficient documentation

## 2012-09-21 DIAGNOSIS — E039 Hypothyroidism, unspecified: Secondary | ICD-10-CM | POA: Insufficient documentation

## 2012-09-21 DIAGNOSIS — D0511 Intraductal carcinoma in situ of right breast: Secondary | ICD-10-CM

## 2012-09-21 DIAGNOSIS — D0590 Unspecified type of carcinoma in situ of unspecified breast: Secondary | ICD-10-CM | POA: Insufficient documentation

## 2012-09-21 HISTORY — DX: Raynaud's syndrome without gangrene: I73.00

## 2012-09-21 HISTORY — DX: Malignant neoplasm of unspecified site of unspecified female breast: C50.919

## 2012-09-21 NOTE — Progress Notes (Signed)
Please see the Nurse Progress Note in the MD Initial Consult Encounter for this patient. 

## 2012-09-21 NOTE — Progress Notes (Addendum)
[facility] Radiation Oncology NEW PATIENT EVALUATION  Name: [name] [name] MRN: [id number]  Date:   09/21/2012           DOB: 1945-02-04  Status: outpatient   CC: [name] Hy, MD  Daleen Snook, MD [name] [name] [facility]   REFERRING PHYSICIAN: Marinell [name] [name] Ditty, MD   DIAGNOSIS: Stage 0 (Tis N0 M0) high-grade DCIS of the right breast   HISTORY OF PRESENT ILLNESS:  [name] [name] is a 67 y.o. female who is seen today for the courtesy of Dr. Marinell [name] of [facility] Department of radiation oncology for right breast irradiation closer to home in the management of her high-grade DCIS of the right breast. At the time of a screening mammogram on 06/20/2012 she was noted to have suspicious calcifications within her right breast. Additional [facility] at the [facility] on 07[facility]05/2012 showed a 4 x 9 mm grouping of suspicious microcalcifications at approximately 11:00. Biopsy of the same date was diagnostic for DCIS with associated microcalcifications. The DCIS was strongly ER positive at 100% and PR positive at 95%. HER-2/neu was not tested a post biopsy MR of the breast on 07/10/2012 showed biopsy changes on the right within the upper central breast along with a 5 mm enhancing nodule within the central left breast. I understand that she had a followup MRI at [facility] which was without evidence of the 5 mm nodule within the left breast seen here in [place]. She visited [facility] for her definitive surgery. Pathology was reviewed and she was felt to have nuclear grade 3. On 08/10/2012 she underwent a right partial mastectomy by Dr. Barbaraann [name]. On review her pathology there was no evidence for residual DCIS. There was no clip or microcalcifications seen on radiograph of the specimen. She is doing well postoperatively. Preoperatively, she was seen by [name] [name] of radiation oncology at [facility] who has hyer seen today for right breast radiation closer to home. Of note is that she does carry a  diagnosis of discoid lupus with no history of vasculitis or use of immunosuppressive therapy. She has been on estrogen replacement therapy for many years. She discontinued this after her diagnosis of DCIS.  PREVIOUS RADIATION THERAPY: No   PAST MEDICAL HISTORY:  has a past medical history of Asthma, mild intermittent; Acute asthmatic bronchitis; Vasomotor rhinitis; Hypothyroidism; Diverticulosis (2004); Heart murmur; Fibroids (1998); History of TMJ disorder; Fracture of fibula (2007); Hypertension; Cystocele; Osteopenia (2002); Cancer (07/2012); Raynaud's disease; and Breast cancer (08/2012).     PAST SURGICAL HISTORY:  Past Surgical History  Procedure Laterality Date  . Thyroidectomy    . Minor breast biopsy Left     times two  . Rotator cuff repair Right 2010  . Hysteroscopy with resectoscope  2002    submucous fibroid  . Breast surgery Right 08/10/12    Lumpectomy @([facility])     FAMILY HISTORY: family history includes Aneurysm in her mother; Cancer in her maternal grandfather and sister; Clotting disorder in her maternal grandfather; Hypertension in her sister.   SOCIAL HISTORY:  reports that she has never smoked. She has never used smokeless tobacco. She reports that she drinks about 3.0 ounces of alcohol per week. She reports that she does not use illicit drugs. Married, 2 children.   ALLERGIES: Penicillins and Pneumovax   MEDICATIONS:  Current Outpatient Prescriptions  Medication Sig Dispense Refill  . albuterol (PROVENTIL HFA;VENTOLIN HFA) 108 (90 BASE) MCG/ACT inhaler Inhale 1 puff into the lungs every 6 (six) hours as  needed for wheezing or shortness of breath.      Marland Kitchen amLODipine-atorvastatin (CADUET) 5-10 MG per tablet Take 1 tablet by mouth daily.      Marland Kitchen aspirin 81 MG tablet Take 81 mg by mouth daily.      Marland Kitchen azithromycin (ZITHROMAX) 1 G powder Take 1 packet by mouth once.      . calcium-vitamin D (OSCAL WITH D) 500-200 MG-UNIT per tablet Take 1 tablet by mouth daily.      .  cholecalciferol (VITAMIN D) 1000 UNITS tablet Take 1,000 Units by mouth 2 (two) times a week.      . levothyroxine (SYNTHROID, LEVOTHROID) 75 MCG tablet Take 75 mcg by mouth daily.      . mometasone (ASMANEX 60 METERED DOSES) 220 MCG/INH inhaler Inhale 1 puff by mouth daily as directed  1 Inhaler  11   No current facility-administered medications for this encounter.     REVIEW OF SYSTEMS:  Pertinent items are noted in HPI.    PHYSICAL EXAM:  weight is 120 lb 14.4 oz (54.84 kg). Her oral temperature is 98.1 F (36.7 C). Her blood pressure is 142/61 and her pulse is 58. Her respiration is 20.   Alert and oriented. Head and neck examination: Grossly unremarkable. Nodes: Without palpable cervical, supraclavicular, or axillary lymphadenopathy. Chest: Lungs clear. Back: Without spinal or CVA tenderness. Breasts: There is a well-healed partial mastectomy wound along the upper-outer quadrant of the right breast at approximately 11:00. No masses are appreciated. Left breast without masses or lesions. Abdomen without hepatomegaly. Extremities: Without edema.   LABORATORY DATA:  Lab Results  Component Value Date   HGB 14.0 03/14/2008   Lab Results  Component Value Date   NA 133* 03/11/2008   K 3.6 03/11/2008   CL 102 03/11/2008   CO2 28 03/11/2008   No results found for this basename: ALT, AST, GGT, ALKPHOS, BILITOT      IMPRESSION: Stage 0 (Tis N0 M0) high-grade DCIS of the right breast. We discussed the role of adjuvant radiation therapy in the treatment of DCIS. I would like to obtain a baseline right breast mammogram to confirm removal of all suspicious microcalcifications. I do have some concern in that the biopsy clip was not seen on radiograph of the specimen at the time of her partial mastectomy. This is scheduled for tomorrow morning. We discussed the potential acute and late toxicities of radiation therapy. We discussed the fact that she may have increased acute and late skin toxicity with a  diagnosis of discoid lupus. I would ordinarily offer her hypo-fractionated radiation therapy, but with her diagnosis of discoid lupus I would stick with standard fractionation delivering approximately 4500-5000 cGy over a period of 5 weeks. We'll monitor her skin reaction closely. Consent is signed today. Lastly, we discussed both chemopreventative/adjuvant anti-estrogen therapy in the management of her DICS. She is not interested int the B-41 trial even if there was enough tissue for outside Her 2 neu assay. She is willing to see Med Onc here for consultation during her course of XRT.   PLAN: As discussed above.  I spent 60 minutes minutes face to face with the patient and more than 50% of that time was spent in counseling and/or coordination of care.

## 2012-09-21 NOTE — Addendum Note (Signed)
Encounter addended by: Maryln Gottron, MD on: 09/21/2012 10:23 PM<BR>     Documentation filed: Notes Section

## 2012-09-22 ENCOUNTER — Ambulatory Visit
Admission: RE | Admit: 2012-09-22 | Discharge: 2012-09-22 | Disposition: A | Discharge status: Home / Self Care | Payer: PRIVATE HEALTH INSURANCE | Source: Ambulatory Visit | Attending: Radiation Oncology | Admitting: Radiation Oncology

## 2012-09-22 DIAGNOSIS — D0511 Intraductal carcinoma in situ of right breast: Secondary | ICD-10-CM

## 2012-09-26 ENCOUNTER — Ambulatory Visit: Payer: PRIVATE HEALTH INSURANCE

## 2012-09-26 ENCOUNTER — Ambulatory Visit: Payer: PRIVATE HEALTH INSURANCE | Admitting: Radiation Oncology

## 2012-10-03 ENCOUNTER — Ambulatory Visit
Admission: RE | Admit: 2012-10-03 | Discharge: 2012-10-03 | Disposition: A | Discharge status: Home / Self Care | Payer: PRIVATE HEALTH INSURANCE | Source: Ambulatory Visit | Attending: Radiation Oncology | Admitting: Radiation Oncology

## 2012-10-03 DIAGNOSIS — D0590 Unspecified type of carcinoma in situ of unspecified breast: Secondary | ICD-10-CM | POA: Insufficient documentation

## 2012-10-03 DIAGNOSIS — Z510 Encounter for antineoplastic radiation therapy: Secondary | ICD-10-CM | POA: Insufficient documentation

## 2012-10-03 DIAGNOSIS — Y842 Radiological procedure and radiotherapy as the cause of abnormal reaction of the patient, or of later complication, without mention of misadventure at the time of the procedure: Secondary | ICD-10-CM | POA: Insufficient documentation

## 2012-10-03 DIAGNOSIS — D0511 Intraductal carcinoma in situ of right breast: Secondary | ICD-10-CM

## 2012-10-03 DIAGNOSIS — L589 Radiodermatitis, unspecified: Secondary | ICD-10-CM | POA: Insufficient documentation

## 2012-10-03 NOTE — Progress Notes (Signed)
Complex simulation/treatment planning note: The patient was taken to the CT simulator. She was placed on a custom breast board. A custom Acuform neck mold was constructed for immobilization. Her right breast field borders were marked with radiopaque wires along with her partial mastectomy scar. She was then scanned. I contoured her tumor bed and reviewed her normal contours including her partial mastectomy scar. She was set up to medial and lateral right breast tangents. 2 separate multileaf collimators were designed to conform the field. I prescribing 4800 cGy in 24 sessions utilizing 6 MV photons with forward planning.

## 2012-10-10 ENCOUNTER — Encounter: Payer: Self-pay | Admitting: Radiation Oncology

## 2012-10-10 ENCOUNTER — Ambulatory Visit
Admission: RE | Admit: 2012-10-10 | Discharge: 2012-10-10 | Disposition: A | Discharge status: Home / Self Care | Payer: PRIVATE HEALTH INSURANCE | Source: Ambulatory Visit | Attending: Radiation Oncology | Admitting: Radiation Oncology

## 2012-10-11 ENCOUNTER — Ambulatory Visit
Admission: RE | Admit: 2012-10-11 | Discharge: 2012-10-11 | Disposition: A | Discharge status: Home / Self Care | Payer: PRIVATE HEALTH INSURANCE | Source: Ambulatory Visit | Attending: Radiation Oncology | Admitting: Radiation Oncology

## 2012-10-11 DIAGNOSIS — D0511 Intraductal carcinoma in situ of right breast: Secondary | ICD-10-CM

## 2012-10-11 MED ORDER — ALRA NON-METALLIC DEODORANT (RAD-ONC)
1.0000 "application " | Freq: Once | TOPICAL | Status: AC
  Administered 2012-10-11: 1 via TOPICAL

## 2012-10-11 MED ORDER — RADIAPLEXRX EX GEL
Freq: Once | CUTANEOUS | Status: AC
  Administered 2012-10-11: 11:00:00 via TOPICAL

## 2012-10-11 NOTE — Progress Notes (Signed)
Simulation Verification Note Outpatient R Breast  The patient was brought to the treatment unit and placed in the planned treatment position. The clinical setup was verified. Then port films were obtained and uploaded to the radiation oncology medical record software.  The treatment beams were carefully compared against the planned radiation fields. The position location and shape of the radiation fields was reviewed. They targeted volume of tissue appears to be appropriately covered by the radiation beams. Organs at risk appear to be excluded as planned.  Based on my personal review, I approved the simulation verification. The patient's treatment will proceed as planned.  -----------------------------------  Lonie Peak, MD

## 2012-10-11 NOTE — Progress Notes (Signed)
Post sim ed completed w/pt. Gave pt "Radiation and You" booklet w/all pertinent information marked and discussed, re: fatigue, skin irritation/care, nutrition, pain. Gave pt Radiaplex, Alra w/instructions for proper use. Pt verbalized understanding. Pt states she would like to discuss medication for hot flashes w/Dr Dayton Scrape next Monday. She states "they are not bad, but he told me there is something I can take for them."

## 2012-10-12 ENCOUNTER — Ambulatory Visit
Admission: RE | Admit: 2012-10-12 | Discharge: 2012-10-12 | Disposition: A | Discharge status: Home / Self Care | Payer: PRIVATE HEALTH INSURANCE | Source: Ambulatory Visit | Attending: Radiation Oncology | Admitting: Radiation Oncology

## 2012-10-13 ENCOUNTER — Ambulatory Visit
Admission: RE | Admit: 2012-10-13 | Discharge: 2012-10-13 | Disposition: A | Discharge status: Home / Self Care | Payer: PRIVATE HEALTH INSURANCE | Source: Ambulatory Visit | Attending: Radiation Oncology | Admitting: Radiation Oncology

## 2012-10-16 ENCOUNTER — Ambulatory Visit
Admission: RE | Admit: 2012-10-16 | Discharge: 2012-10-16 | Disposition: A | Discharge status: Home / Self Care | Payer: PRIVATE HEALTH INSURANCE | Source: Ambulatory Visit | Attending: Radiation Oncology | Admitting: Radiation Oncology

## 2012-10-16 ENCOUNTER — Encounter: Payer: Self-pay | Admitting: Radiation Oncology

## 2012-10-16 VITALS — BP 160/80 | HR 54 | Temp 98.1°F | Resp 20 | Wt 119.9 lb

## 2012-10-16 DIAGNOSIS — C50911 Malignant neoplasm of unspecified site of right female breast: Secondary | ICD-10-CM

## 2012-10-16 DIAGNOSIS — D0511 Intraductal carcinoma in situ of right breast: Secondary | ICD-10-CM

## 2012-10-16 MED ORDER — RADIAPLEXRX EX GEL
Freq: Once | CUTANEOUS | Status: AC
  Administered 2012-10-16: 11:00:00 via TOPICAL

## 2012-10-16 NOTE — Progress Notes (Signed)
Weekly Management Note:   Site: Right breast Current Dose:  800  cGy Projected Dose: 4800  CGy , no boost  Narrative: The patient is seen today for routine under treatment assessment. CBCT/MVCT images/port films were reviewed. The chart was reviewed.   She is without complaints today except for continued hot flashes related to discontinuation of her estrogen supplementation.. She uses Radioplex gel.  Physical Examination:  Filed Vitals:   10/16/12 1036  BP: 160/80  Pulse: 54  Temp: 98.1 F (36.7 C)  Resp: 20  .  Weight: 119 lb 14.4 oz (54.386 kg). On inspection the right breast there are no significant skin changes.  Impression: Tolerating radiation therapy well. With respect to her hot flashes, she will see Dr. Evlyn Kanner for a followup visit in the near future, and this can be addressed. She also expect to see Dr. Darnelle Catalan following completion of her radiation therapy.  Plan: Continue radiation therapy as planned.

## 2012-10-16 NOTE — Progress Notes (Signed)
Pt denies pain, fatigue, loss of appetite. She is applying Radiaplex to right breast treatment area. She requested a second tube for her home use; gave her tube.

## 2012-10-17 ENCOUNTER — Ambulatory Visit
Admission: RE | Admit: 2012-10-17 | Discharge: 2012-10-17 | Disposition: A | Discharge status: Home / Self Care | Payer: PRIVATE HEALTH INSURANCE | Source: Ambulatory Visit | Attending: Radiation Oncology | Admitting: Radiation Oncology

## 2012-10-18 ENCOUNTER — Ambulatory Visit
Admission: RE | Admit: 2012-10-18 | Discharge: 2012-10-18 | Disposition: A | Discharge status: Home / Self Care | Payer: PRIVATE HEALTH INSURANCE | Source: Ambulatory Visit | Attending: Radiation Oncology | Admitting: Radiation Oncology

## 2012-10-19 ENCOUNTER — Ambulatory Visit
Admission: RE | Admit: 2012-10-19 | Discharge: 2012-10-19 | Disposition: A | Discharge status: Home / Self Care | Payer: PRIVATE HEALTH INSURANCE | Source: Ambulatory Visit | Attending: Radiation Oncology | Admitting: Radiation Oncology

## 2012-10-20 ENCOUNTER — Ambulatory Visit
Admission: RE | Admit: 2012-10-20 | Discharge: 2012-10-20 | Disposition: A | Discharge status: Home / Self Care | Payer: PRIVATE HEALTH INSURANCE | Source: Ambulatory Visit | Attending: Radiation Oncology | Admitting: Radiation Oncology

## 2012-10-23 ENCOUNTER — Encounter: Payer: Self-pay | Admitting: Radiation Oncology

## 2012-10-23 ENCOUNTER — Ambulatory Visit
Admission: RE | Admit: 2012-10-23 | Discharge: 2012-10-23 | Disposition: A | Discharge status: Home / Self Care | Payer: PRIVATE HEALTH INSURANCE | Source: Ambulatory Visit | Attending: Radiation Oncology | Admitting: Radiation Oncology

## 2012-10-23 VITALS — BP 144/70 | HR 54 | Temp 98.4°F | Resp 20 | Wt 119.8 lb

## 2012-10-23 DIAGNOSIS — C50911 Malignant neoplasm of unspecified site of right female breast: Secondary | ICD-10-CM

## 2012-10-23 NOTE — Progress Notes (Signed)
Pt denies pain, fatigue, loss of appetite. She is applying Radiaplex to right breast treatment area, no skin changes at this time.

## 2012-10-23 NOTE — Progress Notes (Signed)
Weekly Management Note:  Site: Right breast Current Dose:  1800  cGy Projected Dose: 4800  cGy  Narrative: The patient is seen today for routine under treatment assessment. CBCT/MVCT images/port films were reviewed. The chart was reviewed.   She is without complaints today except for continued hot flashes. She has not been able to get him to see Dr. Evlyn Kanner. She uses Radioplex gel.  Physical Examination:  Filed Vitals:   10/23/12 1042  BP: 144/70  Pulse: 54  Temp: 98.4 F (36.9 C)  Resp: 20  .  Weight: 119 lb 12.8 oz (54.341 kg). There is faint erythema along the right breast with no areas of desquamation.  Impression: Tolerating radiation therapy well.  Plan: Continue radiation therapy as planned.

## 2012-10-24 ENCOUNTER — Ambulatory Visit
Admission: RE | Admit: 2012-10-24 | Discharge: 2012-10-24 | Disposition: A | Discharge status: Home / Self Care | Payer: PRIVATE HEALTH INSURANCE | Source: Ambulatory Visit | Attending: Radiation Oncology | Admitting: Radiation Oncology

## 2012-10-25 ENCOUNTER — Ambulatory Visit
Admission: RE | Admit: 2012-10-25 | Discharge: 2012-10-25 | Disposition: A | Discharge status: Home / Self Care | Payer: PRIVATE HEALTH INSURANCE | Source: Ambulatory Visit | Attending: Radiation Oncology | Admitting: Radiation Oncology

## 2012-10-26 ENCOUNTER — Ambulatory Visit
Admission: RE | Admit: 2012-10-26 | Discharge: 2012-10-26 | Disposition: A | Discharge status: Home / Self Care | Payer: PRIVATE HEALTH INSURANCE | Source: Ambulatory Visit | Attending: Radiation Oncology | Admitting: Radiation Oncology

## 2012-10-27 ENCOUNTER — Ambulatory Visit
Admission: RE | Admit: 2012-10-27 | Discharge: 2012-10-27 | Disposition: A | Discharge status: Home / Self Care | Payer: PRIVATE HEALTH INSURANCE | Source: Ambulatory Visit | Attending: Radiation Oncology | Admitting: Radiation Oncology

## 2012-10-30 ENCOUNTER — Ambulatory Visit
Admission: RE | Admit: 2012-10-30 | Discharge: 2012-10-30 | Disposition: A | Discharge status: Home / Self Care | Payer: PRIVATE HEALTH INSURANCE | Source: Ambulatory Visit | Attending: Radiation Oncology | Admitting: Radiation Oncology

## 2012-10-30 ENCOUNTER — Encounter: Payer: Self-pay | Admitting: Radiation Oncology

## 2012-10-30 VITALS — BP 145/67 | HR 56 | Temp 98.5°F | Ht 62.5 in | Wt 121.0 lb

## 2012-10-30 DIAGNOSIS — D0511 Intraductal carcinoma in situ of right breast: Secondary | ICD-10-CM

## 2012-10-30 MED ORDER — BIAFINE EX EMUL
CUTANEOUS | Status: DC | PRN
  Administered 2012-10-30: 15:00:00 via TOPICAL

## 2012-10-30 NOTE — Progress Notes (Signed)
Weekly Management Note:  Site: Right Current Dose:  2800  cGy Projected Dose: 4800  cGy  Narrative: The patient is seen today for routine under treatment assessment. CBCT/MVCT images/port films were reviewed. The chart was reviewed.    She is without new complaints today except for mild fatigue and your pruritus with a rash along the upper inner quadrant of her right breast. She has been using Radioplex gel.  Physical Examination:  Filed Vitals:   10/30/12 1447  BP: 145/67  Pulse: 56  Temp: 98.5 F (36.9 C)  .  Weight: 121 lb (54.885 kg). There is an early papular radiation dermatitis along the upper-outer quadrant (sun exposed skin) of the right breast. No areas of desquamation.  Impression: Tolerating radiation therapy well, although she does have papular radiation dermatitis. We will change her to Biafine cream which is more moisturizing and more likely to help her with pruritus.  Plan: Continue radiation therapy as planned.

## 2012-10-30 NOTE — Progress Notes (Signed)
[name] [name] has received 14 fractions to her right breast.  Note isolated, red areas in the upper, inner portion of her tx. field.  She reports itching of this area.  She states she is experiencing  some fatigue and took a nap on yesterday.

## 2012-10-31 ENCOUNTER — Ambulatory Visit
Admission: RE | Admit: 2012-10-31 | Discharge: 2012-10-31 | Disposition: A | Discharge status: Home / Self Care | Payer: PRIVATE HEALTH INSURANCE | Source: Ambulatory Visit | Attending: Radiation Oncology | Admitting: Radiation Oncology

## 2012-11-01 ENCOUNTER — Ambulatory Visit
Admission: RE | Admit: 2012-11-01 | Discharge: 2012-11-01 | Disposition: A | Discharge status: Home / Self Care | Payer: PRIVATE HEALTH INSURANCE | Source: Ambulatory Visit | Attending: Radiation Oncology | Admitting: Radiation Oncology

## 2012-11-02 ENCOUNTER — Ambulatory Visit
Admission: RE | Admit: 2012-11-02 | Discharge: 2012-11-02 | Disposition: A | Discharge status: Home / Self Care | Payer: PRIVATE HEALTH INSURANCE | Source: Ambulatory Visit | Attending: Radiation Oncology | Admitting: Radiation Oncology

## 2012-11-03 ENCOUNTER — Ambulatory Visit
Admission: RE | Admit: 2012-11-03 | Discharge: 2012-11-03 | Disposition: A | Discharge status: Home / Self Care | Payer: PRIVATE HEALTH INSURANCE | Source: Ambulatory Visit | Attending: Radiation Oncology | Admitting: Radiation Oncology

## 2012-11-06 ENCOUNTER — Encounter: Payer: Self-pay | Admitting: Radiation Oncology

## 2012-11-06 ENCOUNTER — Ambulatory Visit
Admission: RE | Admit: 2012-11-06 | Discharge: 2012-11-06 | Disposition: A | Discharge status: Home / Self Care | Payer: PRIVATE HEALTH INSURANCE | Source: Ambulatory Visit | Attending: Radiation Oncology | Admitting: Radiation Oncology

## 2012-11-06 VITALS — BP 128/52 | HR 55 | Temp 97.6°F | Resp 20 | Wt 120.6 lb

## 2012-11-06 DIAGNOSIS — C50911 Malignant neoplasm of unspecified site of right female breast: Secondary | ICD-10-CM

## 2012-11-06 NOTE — Progress Notes (Signed)
Pt denies pain, fatigue, loss of appetite. She is applying Biafine lotion to right breast treatment area. She has a few red rash-like areas on upper right breast. She is applying Clobetasol cream from her dermatologist for itchiness. She has brought cream for Dr Dayton Scrape to see today.

## 2012-11-06 NOTE — Progress Notes (Signed)
Weekly Management Note:  Site: Right breast Current Dose:  3800  cGy Projected Dose: 1800  cGy  Narrative: The patient is seen today for routine under treatment assessment. CBCT/MVCT images/port films were reviewed. The chart was reviewed.   She is without new complaints today. She still has pruritus with dermatitis along the upper inner quadrant of her right breast. She uses Biafine cream and also a prescription corticosteroid (betamethasone)  Physical Examination:  Filed Vitals:   11/06/12 1318  BP: 128/52  Pulse: 55  Temp: 97.6 F (36.4 C)  Resp: 20  .  Weight: 120 lb 9.6 oz (54.704 kg). There is a papular erythematous rash along the upper-outer quadrant of the right breast. The remainder of the breast is mild to moderately erythematous with no areas of desquamation.  Impression: Tolerating radiation therapy well.  Plan: Continue radiation therapy as planned.

## 2012-11-07 ENCOUNTER — Ambulatory Visit
Admission: RE | Admit: 2012-11-07 | Discharge: 2012-11-07 | Disposition: A | Discharge status: Home / Self Care | Payer: PRIVATE HEALTH INSURANCE | Source: Ambulatory Visit | Attending: Radiation Oncology | Admitting: Radiation Oncology

## 2012-11-08 ENCOUNTER — Ambulatory Visit
Admission: RE | Admit: 2012-11-08 | Discharge: 2012-11-08 | Disposition: A | Discharge status: Home / Self Care | Payer: PRIVATE HEALTH INSURANCE | Source: Ambulatory Visit | Attending: Radiation Oncology | Admitting: Radiation Oncology

## 2012-11-09 ENCOUNTER — Ambulatory Visit
Admission: RE | Admit: 2012-11-09 | Discharge: 2012-11-09 | Disposition: A | Discharge status: Home / Self Care | Payer: PRIVATE HEALTH INSURANCE | Source: Ambulatory Visit | Attending: Radiation Oncology | Admitting: Radiation Oncology

## 2012-11-10 ENCOUNTER — Ambulatory Visit
Admission: RE | Admit: 2012-11-10 | Discharge: 2012-11-10 | Disposition: A | Discharge status: Home / Self Care | Payer: PRIVATE HEALTH INSURANCE | Source: Ambulatory Visit | Attending: Radiation Oncology | Admitting: Radiation Oncology

## 2012-11-10 NOTE — Progress Notes (Addendum)
[name] [name] was screened in registration and answered "Yes" to being in close proximity to a family member who returned yesterday from [place], which is in [place].  She does not fit the criteria for any further Infectious Disease Screening.

## 2012-11-13 ENCOUNTER — Encounter: Payer: Self-pay | Admitting: Radiation Oncology

## 2012-11-13 ENCOUNTER — Ambulatory Visit
Admission: RE | Admit: 2012-11-13 | Discharge: 2012-11-13 | Disposition: A | Discharge status: Home / Self Care | Payer: PRIVATE HEALTH INSURANCE | Source: Ambulatory Visit | Attending: Radiation Oncology | Admitting: Radiation Oncology

## 2012-11-13 VITALS — BP 156/75 | HR 61 | Temp 97.8°F | Resp 20 | Wt 121.6 lb

## 2012-11-13 DIAGNOSIS — D0511 Intraductal carcinoma in situ of right breast: Secondary | ICD-10-CM

## 2012-11-13 DIAGNOSIS — C50911 Malignant neoplasm of unspecified site of right female breast: Secondary | ICD-10-CM

## 2012-11-13 MED ORDER — BIAFINE EX EMUL
Freq: Two times a day (BID) | CUTANEOUS | Status: DC
  Administered 2012-11-13: 11:00:00 via TOPICAL

## 2012-11-13 NOTE — Progress Notes (Signed)
Pt denies pain, fatigue, loss of appetite.  Pt applying Biafine to upper right chest, states it relieves the itching. Gave pt another tube of Biafine. She completed treatment today, has FU appt.

## 2012-11-13 NOTE — Progress Notes (Addendum)
[facility] Radiation Oncology End of Treatment Note  Name:[name] [name]  Date: 11/13/2012 GEX:[id number] DOB:1945/07/23   Status:outpatient    CC: Julian Hy, MD  Dr.Junzo Chino [facility]), Dr. Max Sane [facility]), Dr. Marikay Alar Magrinat  REFERRING PHYSICIAN: Dr. Craige Cotta [facility])     DIAGNOSIS: Stage 0 (Tis N0 M0) high-grade DCIS of the right breast   INDICATION FOR TREATMENT: Curative   TREATMENT DATES: 10/11/2012 through 11/13/2012                          SITE/DOSE:  Right breast 4800 cGy in 24 sessions                        BEAMS/ENERGY:   Tangential fields to the right breast, 6 MV photons                NARRATIVE:   [name] tolerated her  treatment beautifully although she developed erythematous papular lesions along the upper inner quadrant of the right breast felt to be related to her discoid lupus. She applied topical corticosteroids.                         PLAN: Routine followup in one month. Patient instructed to call if questions or worsening complaints in interim. I will also be getting her scheduled to see Dr. Marikay Alar Magrinat for discussion of adjuvant/chemopreventive antiestrogen therapy.

## 2012-11-13 NOTE — Progress Notes (Addendum)
Chart note: [name] [name] finished her radiation therapy this morning following conservative surgery at [facility] for her high grade DCIS of the right breast. Her DCIS was hormone receptor positive, and she will be scheduled see Dr. Marikay Alar Magrinat for discussion of antiestrogen therapy.

## 2012-11-13 NOTE — Progress Notes (Signed)
Weekly Management Note:  Site: Right breast Current Dose:  4800  cGy Projected Dose: 4800  cGy  Narrative: The patient is seen today for routine under treatment assessment. CBCT/MVCT images/port films were reviewed. The chart was reviewed.   She is without new complaints today. She still bothered by her erythematous papules along the upper inner quadrant of the right breast felt to be related to her discoid lupus. She is using topical corticosteroids. She uses Biafine cream elsewhere along the right breast.  Physical Examination:  Filed Vitals:   11/13/12 1019  BP: 156/75  Pulse: 61  Temp: 97.8 F (36.6 C)  Resp: 20  .  Weight: 121 lb 9.6 oz (55.157 kg). Her erythematous papules along the upper inner quadrant of the right breast are essentially unchanged or slightly larger.  Impression: Radiation therapy completed.   Plan: Followup visit with me in one month. She is to continue her topical corticosteroids and Biafine cream. If there is no improvement, she is to call me and/or see Dr. Amy [place]. She will see Dr. Adrian Prince later this week.

## 2012-11-14 ENCOUNTER — Other Ambulatory Visit: Payer: Self-pay | Admitting: Oncology

## 2012-12-02 ENCOUNTER — Other Ambulatory Visit: Payer: Self-pay | Admitting: Oncology

## 2012-12-06 ENCOUNTER — Other Ambulatory Visit: Payer: Self-pay | Admitting: Oncology

## 2012-12-07 ENCOUNTER — Other Ambulatory Visit: Payer: Self-pay

## 2012-12-15 ENCOUNTER — Telehealth: Payer: Self-pay | Admitting: *Deleted

## 2012-12-15 ENCOUNTER — Encounter: Payer: Self-pay | Admitting: *Deleted

## 2012-12-15 NOTE — Telephone Encounter (Signed)
Talbert Forest called me back and stated that this will be a new pt and will make sure Dr. Dayton Scrape places a referral in the workque.

## 2012-12-15 NOTE — Progress Notes (Signed)
Gave paperwork to Dr. Magrinat for a date and time to schedule this pt. 

## 2012-12-15 NOTE — Telephone Encounter (Signed)
Shirley in [employer] called stating that pt needs a f/u appt w/ Dr. Darnelle Catalan.  Looked and I don't see that the pt has ever seen Dr. Darnelle Catalan before.  Called and left a message for Talbert Forest to help with this if I am missing something and if this is in fact a new referral that one has to be placed in the [place].

## 2012-12-19 ENCOUNTER — Telehealth: Payer: Self-pay | Admitting: *Deleted

## 2012-12-19 ENCOUNTER — Ambulatory Visit
Admission: RE | Admit: 2012-12-19 | Discharge: 2012-12-19 | Disposition: A | Discharge status: Home / Self Care | Payer: PRIVATE HEALTH INSURANCE | Source: Ambulatory Visit | Attending: Radiation Oncology | Admitting: Radiation Oncology

## 2012-12-19 ENCOUNTER — Encounter: Payer: Self-pay | Admitting: Radiation Oncology

## 2012-12-19 ENCOUNTER — Other Ambulatory Visit: Payer: Self-pay | Admitting: Radiation Oncology

## 2012-12-19 VITALS — BP 145/69 | HR 53 | Temp 98.2°F | Resp 20 | Wt 121.6 lb

## 2012-12-19 DIAGNOSIS — C50911 Malignant neoplasm of unspecified site of right female breast: Secondary | ICD-10-CM

## 2012-12-19 DIAGNOSIS — D0511 Intraductal carcinoma in situ of right breast: Secondary | ICD-10-CM

## 2012-12-19 NOTE — Telephone Encounter (Signed)
CALLED PATIENT TO INFORM OF FU VISIT WITH DR. Dayton Scrape AND OF APPT. WITH DR. MAGRINAT ON 01-11-13- AT 4 PM, LVM FOR A RETURN CALL

## 2012-12-19 NOTE — Progress Notes (Signed)
CC: Dr. Adrian Prince, Dr. Marikay Alar Magrinat, Dr. Max Sane [facility]), Dr. Craige Cotta [facility])  Followup note:  [name] [name] returns today approximately 1 month following completion of radiation therapy following conservative surgery in the management of her high-grade DCIS of the right breast. She is without complaints today except for persistent hot flashes. Following her treatment she had exacerbation of her discoid lupus along the superior aspect of the right breast. She is treated with corticosteroid cream with resolution. She tells me she expects to see Dr. Barbaraann Cao at [facility] early next year where she is to have a six-month followup MRI scan of her breasts. [facility] mamm[facility] here in [place]. She has not yet see Dr. Darnelle Catalan for discussion of antiestrogen therapy for her hormone receptor positive DCIS.  Physical examination: Alert and oriented. Filed Vitals:   12/19/12 1056  BP: 145/69  Pulse: 53  Temp: 98.2 F (36.8 C)  Resp: 20   Head and neck examination: Grossly unremarkable. Nodes: Without palpable cervical, supraclavicular, or axillary lymphadenopathy. Chest: Lungs clear. Breasts: There were a few hyperpigmented patches along the superior aspect of the right breast from healed discoid lupus. There is residual hyperpigmentation of the skin along the right breast. There is a 2 cm residual seroma along the superior aspect of the right breast at 12:00 adjacent to the lateral aspect of her partial mastectomy scar. No other masses are appreciated. Left breast without masses or lesions. Abdomen: Without hepatomegaly. Extremities: Without edema.  Impression: Satisfactory progress. I will check with the schedulers to see when she can be seen by Dr. Darnelle Catalan. She will followup with her MRI scan in visit to [facility] early next year. She typically has her mammography here at the [facility] and late spring, think that she can have a followup baseline mammogram of  the right breast, and screening mammogram of the left breast in May or June of this year. This has been scheduled through Dr. Evlyn Kanner in the past.  Plan: As above. I'll see the patient for a followup visit in approximately 6 months.

## 2012-12-19 NOTE — Telephone Encounter (Signed)
Pt returned my call and I confirmed 01/11/13 appt w/ pt.  Mailed before appt letter, welcome packet & intake form to pt.  Called Laughlin in [place] to make her aware.  Took paperwork to Med Rec for chart.

## 2012-12-19 NOTE — Progress Notes (Signed)
Pt denies pain, loss of appetite. She states "most of her fatigue is gone". She is now applying Mederma to right breast area.

## 2012-12-19 NOTE — Telephone Encounter (Signed)
Received date and time from Dr. Darnelle Catalan.  Called and left a message for pt to return my call so I can schedule her appt.

## 2012-12-25 ENCOUNTER — Other Ambulatory Visit: Payer: Self-pay | Admitting: *Deleted

## 2012-12-25 ENCOUNTER — Encounter: Payer: Self-pay | Admitting: *Deleted

## 2012-12-25 DIAGNOSIS — C50411 Malignant neoplasm of upper-outer quadrant of right female breast: Secondary | ICD-10-CM

## 2012-12-25 DIAGNOSIS — Z853 Personal history of malignant neoplasm of breast: Secondary | ICD-10-CM | POA: Insufficient documentation

## 2012-12-25 NOTE — Progress Notes (Signed)
Received chart back from Dawn and placed in Dr. Magrinat's box.  

## 2013-01-11 ENCOUNTER — Other Ambulatory Visit (HOSPITAL_BASED_OUTPATIENT_CLINIC_OR_DEPARTMENT_OTHER): Payer: PRIVATE HEALTH INSURANCE

## 2013-01-11 ENCOUNTER — Ambulatory Visit: Payer: PRIVATE HEALTH INSURANCE

## 2013-01-11 ENCOUNTER — Ambulatory Visit (HOSPITAL_BASED_OUTPATIENT_CLINIC_OR_DEPARTMENT_OTHER): Payer: PRIVATE HEALTH INSURANCE | Admitting: Oncology

## 2013-01-11 ENCOUNTER — Encounter: Payer: Self-pay | Admitting: Oncology

## 2013-01-11 VITALS — BP 169/89 | HR 61 | Temp 98.4°F | Resp 18 | Ht 62.5 in | Wt 120.4 lb

## 2013-01-11 DIAGNOSIS — R232 Flushing: Secondary | ICD-10-CM

## 2013-01-11 DIAGNOSIS — G4700 Insomnia, unspecified: Secondary | ICD-10-CM

## 2013-01-11 DIAGNOSIS — C50411 Malignant neoplasm of upper-outer quadrant of right female breast: Secondary | ICD-10-CM

## 2013-01-11 DIAGNOSIS — D0590 Unspecified type of carcinoma in situ of unspecified breast: Secondary | ICD-10-CM

## 2013-01-11 LAB — CBC WITH DIFFERENTIAL/PLATELET
Basophils Absolute: 0 10*3/uL (ref 0.0–0.1)
EOS%: 3.1 % (ref 0.0–7.0)
Eosinophils Absolute: 0.2 10*3/uL (ref 0.0–0.5)
HCT: 39.6 % (ref 34.8–46.6)
HGB: 12.6 g/dL (ref 11.6–15.9)
MONO#: 0.7 10*3/uL (ref 0.1–0.9)
NEUT#: 2.9 10*3/uL (ref 1.5–6.5)
RDW: 12.3 % (ref 11.2–14.5)
WBC: 5.1 10*3/uL (ref 3.9–10.3)
lymph#: 1.4 10*3/uL (ref 0.9–3.3)

## 2013-01-11 LAB — COMPREHENSIVE METABOLIC PANEL (CC13)
AST: 30 U/L (ref 5–34)
Albumin: 3.8 g/dL (ref 3.5–5.0)
Anion Gap: 10 mEq/L (ref 3–11)
BUN: 13.9 mg/dL (ref 7.0–26.0)
CO2: 27 mEq/L (ref 22–29)
Calcium: 9.6 mg/dL (ref 8.4–10.4)
Chloride: 106 mEq/L (ref 98–109)
Glucose: 85 mg/dl (ref 70–140)
Potassium: 3.8 mEq/L (ref 3.5–5.1)

## 2013-01-11 MED ORDER — GABAPENTIN 300 MG PO CAPS: 300.0000 mg | ORAL_CAPSULE | Freq: Every day | ORAL | Status: DC

## 2013-01-11 MED ORDER — GABAPENTIN 300 MG PO CAPS: 300.0000 mg | ORAL_CAPSULE | Freq: Three times a day (TID) | ORAL | Status: DC

## 2013-01-11 NOTE — Progress Notes (Signed)
Checked in new patient with no financial issues and she has her [facility] packet and has not been to [place].

## 2013-01-11 NOTE — Progress Notes (Signed)
ID: [name] [name] OB: 1945-12-16  MR#: [id number]  NFA#:[id number]  PCP: [name] Hy, MD GYN:  [name] [name] SU:  OTHER MD: [name] [name], [name] [name] [name] [name] [name] [name]  CHIEF COMPLAINT:  HISTORY OF PRESENT ILLNESS: The patient had routine yearly screening mammography at the breast center 06/20/2012 showing new calcifications in the right breast. Digital right mammography 07/05/2012 showed the breast to be category 2 density. Calcifications in the upper central right breast were confirmed, measuring 9 mm overall. Biopsy of this area 07/05/2012 showed ([id number]) ductal carcinoma in situ, grade 2 or 3, estrogen receptor 100% positive, progesterone receptor 95% positive, both with strong staining intensity.  On 07/10/2012 the patient underwent bilateral breast MRI. Aside from post biopsy changes there were no findings of concern in the right breast. In the central left breast there was a 5 mm enhancing nodule.(However, it appears that on a subsequent study at [facility] the left breast area of concern was not demonstrated.) There was no axillary or internal mammary chain lymphadenopathy present.   The patient then sought evaluation under Dr.Giorgiade at [facility] and on 08/10/2012 underwent needle localization right lumpectomy, with the pathology ([id number]) showing no residual ductal carcinoma in situ, and no evidence of invasive carcinoma.  The patient then met with Dr. Chipper [name] and completed her radiation treatments 11/13/2012 (48 gray in 24 sessions to the right breast). She tolerated treatment well.  Her subsequent history is as detailed below  INTERVAL HISTORY: "[name] [name]" was evaluated in the breast clinic 01/11/2013 accompanied by her husband [name] [name]  REVIEW OF SYSTEMS: The main problems she is having are hormonal. She did well when receiving hormone replacement. Of course that did increase her risk of developing breast cancer and it has been stopped. Now she has  hot flashes, which are worse at night, but also occurr during the day. She has insomnia. She feels emotionally unstable. It's not exactly anxiety he says that she "felt better on hormones". She has vaginal dryness issues. There is some mild urinary stress incontinence which may be related to that. One thing she has avoided is the weight gain that frequently accompanies menopause. This is likely because she goes to the gym several days a week. Aside from these issues, which are common manifestations of menopause, a detailed review of systems today was negative.  PAST MEDICAL HISTORY: Past Medical History  Diagnosis Date  . Asthma, mild intermittent   . Acute asthmatic bronchitis   . Vasomotor rhinitis   . Hypothyroidism   . Diverticulosis 2004  . Heart murmur     nl echo  . Fibroids 1998  . History of TMJ disorder   . Fracture of fibula 2007    left;  hit with golf ball  . Hypertension   . Cystocele     Grade 2  . Osteopenia 2002  . Cancer 07/2012    DCIS right breast  . Raynaud's disease   . Breast cancer 08/2012    right  . Hx of radiation therapy 10/11/12- 11/13/12    right breast 4800 cGy 24 sessions    PAST SURGICAL HISTORY: Past Surgical History  Procedure Laterality Date  . Thyroidectomy    . Minor breast biopsy Left     times two  . Rotator cuff repair Right 2010  . Hysteroscopy with resectoscope  2002    submucous fibroid  . Breast surgery Right 08/10/12    Lumpectomy @([facility])    FAMILY HISTORY Family History  Problem Relation Age  of Onset  . Aneurysm Mother   . Hypertension Sister   . Cancer Sister     tongue  . Clotting disorder Maternal Grandfather     liver  . Cancer Maternal Grandfather     liver   the patient's father died of pneumonia at age 20. The patient's mother died from what sounds like her right cured brain aneurysm at the age of 62. The patient had no brothers, 4 sisters. One sister developed tongue cancer. Otherwise there is no history of breast  or ovarian cancer in the family to her knowledge  GYNECOLOGIC HISTORY:  Menarche age 24, first live birth age 51. She is GX P2. Last menstrual period around the year 2000. She used hormone replacement between 2001 and July of 2014.   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 Her husband [name] [name]  owns the local [employer] franchise. Son [name] [name] lives in [place] and works in Community education officer. Son [name] [name] is currently president of [employer]. [[name] [name] had chronic granulomatous disease and received a stem cell transplant from his brother, with excellent results.] The patient has 4 grandchildren. She attends [name].   ADVANCED DIRECTIVES: In place   HEALTH MAINTENANCE: History  Substance Use Topics  . Smoking status: Never Smoker   . Smokeless tobacco: Never Used  . Alcohol Use: 3 - 4.2 oz/week    5-7 Cans of beer per week     Comment: social     Colonoscopy: 2004/[name] [name]  PAP: May 2012  Bone density: 2011/ showed mild osteopenia per patient report  Lipid panel:  Allergies  Allergen Reactions  . Penicillins Hives  . Pneumovax [Pneumococcal Polysaccharides] Swelling    Current Outpatient Prescriptions  Medication Sig Dispense Refill  . amLODipine-atorvastatin (CADUET) 5-10 MG per tablet Take 1 tablet by mouth daily.      Marland Kitchen aspirin 81 MG tablet Take 81 mg by mouth daily.      . calcium-vitamin D (OSCAL WITH D) 500-200 MG-UNIT per tablet Take 1 tablet by mouth daily.      . cholecalciferol (VITAMIN D) 1000 UNITS tablet Take 1,000 Units by mouth 2 (two) times a week.      . levothyroxine (SYNTHROID, LEVOTHROID) 75 MCG tablet Take 75 mcg by mouth daily.      . mometasone (ASMANEX 60 METERED DOSES) 220 MCG/INH inhaler Inhale 1 puff by mouth daily as directed  1 Inhaler  11  . gabapentin (NEURONTIN) 300 MG capsule Take 1 capsule (300 mg total) by mouth at bedtime.  90 capsule  3   No current facility-administered medications for this visit.    OBJECTIVE: Middle-aged white woman in no  acute distress Filed Vitals:   01/11/13 1631  BP: 169/89  Pulse: 61  Temp: 98.4 F (36.9 C)  Resp: 18     Body mass index is 21.66 kg/(m^2).    ECOG FS:0 - Asymptomatic  Ocular: Sclerae unicteric, pupils equal and round Ear-nose-throat: Oropharynx clear, good dentition Lymphatic: No cervical or supraclavicular adenopathy Lungs no rales or rhonchi, good excursion bilaterally Heart regular rate and rhythm, no murmur appreciated Abd soft, nontender, positive bowel sounds MSK no focal spinal tenderness, no joint edema Neuro: non-focal, well-oriented, appropriate affect Breasts: The right breast is status post lumpectomy and radiation. There is no evidence of local recurrence. There are no suspicious masses, and no skin or nipple changes of concern. The right axilla is benign. The left breast is unremarkable Skin: I did not notice lesions of active discoid lupus  LAB RESULTS:  CMP     Component Value Date/Time   NA 143 01/11/2013 1617   NA 133* 03/11/2008 1418   K 3.8 01/11/2013 1617   K 3.6 03/11/2008 1418   CL 102 03/11/2008 1418   CO2 27 01/11/2013 1617   CO2 28 03/11/2008 1418   GLUCOSE 85 01/11/2013 1617   GLUCOSE 75 03/11/2008 1418   BUN 13.9 01/11/2013 1617   BUN 8 03/11/2008 1418   CREATININE 0.7 01/11/2013 1617   CREATININE 0.63 03/11/2008 1418   CALCIUM 9.6 01/11/2013 1617   CALCIUM 8.7 03/11/2008 1418   PROT 8.0 01/11/2013 1617   ALBUMIN 3.8 01/11/2013 1617   AST 30 01/11/2013 1617   ALT 34 01/11/2013 1617   ALKPHOS 108 01/11/2013 1617   BILITOT 0.28 01/11/2013 1617   GFRNONAA >60 03/11/2008 1418   GFRAA  Value: >60        The eGFR has been calculated using the MDRD equation. This calculation has not been validated in all clinical situations. eGFR's persistently <60 mL/min signify possible Chronic Kidney Disease. 03/11/2008 1418    I No results found for this basename: SPEP,  UPEP,   kappa and lambda light chains    Lab Results  Component Value Date   WBC 5.1 01/11/2013    NEUTROABS 2.9 01/11/2013   HGB 12.6 01/11/2013   HCT 39.6 01/11/2013   MCV 93.3 01/11/2013   PLT 233 01/11/2013      Chemistry      Component Value Date/Time   NA 143 01/11/2013 1617   NA 133* 03/11/2008 1418   K 3.8 01/11/2013 1617   K 3.6 03/11/2008 1418   CL 102 03/11/2008 1418   CO2 27 01/11/2013 1617   CO2 28 03/11/2008 1418   BUN 13.9 01/11/2013 1617   BUN 8 03/11/2008 1418   CREATININE 0.7 01/11/2013 1617   CREATININE 0.63 03/11/2008 1418      Component Value Date/Time   CALCIUM 9.6 01/11/2013 1617   CALCIUM 8.7 03/11/2008 1418   ALKPHOS 108 01/11/2013 1617   AST 30 01/11/2013 1617   ALT 34 01/11/2013 1617   BILITOT 0.28 01/11/2013 1617       No results found for this basename: LABCA2    No components found with this basename: LABCA125    No results found for this basename: INR,  in the last 168 hours  Urinalysis    Component Value Date/Time   BILIRUBINUR n 05/24/2012 1112   UROBILINOGEN negative 05/24/2012 1112   NITRITE n 05/24/2012 1112   LEUKOCYTESUR Negative 05/24/2012 1112    STUDIES: No results found. Prior mammography results reviewed  ASSESSMENT: 67 y.o. [place] woman  (1) Status post right breast biopsy 07/05/2012 for ductal carcinoma in situ, grade 2 or 3, estrogen receptor 100% positive, progesterone receptor 95% positive.  (2) status post right lumpectomy and margin clearance 08/10/2012 showing no evidence of residual ductal carcinoma in situ  (3) adjuvant radiation therapy completed 11/13/2012  PLAN: We spent the better part of today's hour-long appointment discussing the biology of breast cancer in general, and the specifics of the patient's tumor in particular. [name] understands her tumor was noninvasive therefore not life-threatening. She had a low risk of local recurrence with surgery alone, but cut that by more than half receiving adjuvant radiation. The residual risk at present is going to be well under 10%. Certainly if she took  anti-estrogens for 5 years that residual risk would be cut in half, but this is a very marginal benefit.  We also discussed taking antiestrogen is as a preventive. The risk of her developing a new breast cancer in either breast is approximately 1% per year. This is larger potential benefit. We then discussed the possible side effects, toxicities and complications of tamoxifen versus anastrozole. At this point she is really not interested in taking antiestrogen for breast cancer prevention.  We then moved to menopause issues and she understands the symptoms she is experiencing are normal part of the menopausal process. It does not mean that they're not unpleasant. We decided we would work on 3 things, namely the hot flashes, the insomnia, and the emotional lability.  For the nighttime hot flashes we're going to start gabapentin 300 mg at bedtime. That'll also help with insomnia. She is welcome to take Ambien, but she understands that medication induced is tolerance. I recommended she not use Ambien more than twice a week.  For the daytime hot flashes and for the emotional lability we are going to try venlafaxine. However I really don't like to start 2 medications at once, because we would not be able to tell which one was the problem, if her problem developed.  Accordingly she is going to call me in about 6 weeks. If she would like to try the venlafaxine at that point, I will call her prescription. Otherwise a like to see her on a once a year basis, and the ideal time would be after her mammogram. That may be in may. Accordingly I have made her a return appointment here for June. However we can change that date as needed.  [name] [name] has a good understanding of the overall plan, and agrees with it. She knows the goal of all her breast treatment is cure. She knows to call for any problems that may develop before her next visit here.     Lowella Dell, MD   01/11/2013 5:42 PM

## 2013-01-12 ENCOUNTER — Telehealth: Payer: Self-pay | Admitting: Oncology

## 2013-01-12 NOTE — Telephone Encounter (Signed)
made appt per 12/11 POF sw pt advised of d/t CAL mailed shh

## 2013-01-15 ENCOUNTER — Telehealth: Payer: Self-pay | Admitting: *Deleted

## 2013-01-15 ENCOUNTER — Encounter: Payer: Self-pay | Admitting: *Deleted

## 2013-01-15 MED ORDER — GABAPENTIN 100 MG PO CAPS: ORAL_CAPSULE | ORAL | Status: DC

## 2013-01-15 NOTE — Progress Notes (Signed)
Mailed after appt letter to pt. 

## 2013-01-15 NOTE — Telephone Encounter (Signed)
Patient called stating that the Neurontin was helping with her hot flashes but she is having a hard time getting up and leaves her feeling very groggy during the day. Per Zollie Scale, PA, we will decrease dose to 100mg  and patient may increase as needed. New Rx to pharmacy. Attempted to call patient, unable to verify caller voicemail.

## 2013-02-01 HISTORY — PX: BREAST BIOPSY: SHX20

## 2013-02-13 ENCOUNTER — Ambulatory Visit (INDEPENDENT_AMBULATORY_CARE_PROVIDER_SITE_OTHER): Payer: PRIVATE HEALTH INSURANCE | Admitting: Adult Health

## 2013-02-13 ENCOUNTER — Encounter: Payer: Self-pay | Admitting: Adult Health

## 2013-02-13 ENCOUNTER — Telehealth: Payer: Self-pay | Admitting: Critical Care Medicine

## 2013-02-13 VITALS — BP 114/72 | HR 60 | Temp 98.5°F | Ht 62.5 in | Wt 122.4 lb

## 2013-02-13 DIAGNOSIS — J45909 Unspecified asthma, uncomplicated: Secondary | ICD-10-CM

## 2013-02-13 MED ORDER — LEVALBUTEROL HCL 0.63 MG/3ML IN NEBU
0.6300 mg | INHALATION_SOLUTION | Freq: Once | RESPIRATORY_TRACT | Status: AC
  Administered 2013-02-13: 0.63 mg via RESPIRATORY_TRACT

## 2013-02-13 MED ORDER — AZITHROMYCIN 250 MG PO TABS: ORAL_TABLET | ORAL | Status: AC

## 2013-02-13 NOTE — Patient Instructions (Signed)
Mucinex DM twice daily as needed. For cough and congestion. Saline nasal rinses as needed. Tylenol as needed Fluids and rest Claritin 10 mg daily as needed for drainage Continue on Asmanex 2 puffs daily until back to baseline then resumed 1 puff daily Z-Pak to have on hold if symptoms do not improve or worsen with discolored mucus. follow up Dr. Joya Gaskins  In 3 months and As needed   Please contact office for sooner follow up if symptoms do not improve or worsen or seek emergency care

## 2013-02-13 NOTE — Assessment & Plan Note (Signed)
Mild flare with URI  xopenex neb x 1   Plan  Mucinex DM twice daily as needed. For cough and congestion. Saline nasal rinses as needed. Tylenol as needed Fluids and rest Claritin 10 mg daily as needed for drainage Continue on Asmanex 2 puffs daily until back to baseline then resumed 1 puff daily Z-Pak to have on hold if symptoms do not improve or worsen with discolored mucus. follow up Dr. Joya Gaskins  In 3 months and As needed   Please contact office for sooner follow up if symptoms do not improve or worsen or seek emergency care

## 2013-02-13 NOTE — Addendum Note (Signed)
Addended by: Parke Poisson E on: 02/13/2013 04:52 PM   Modules accepted: Orders

## 2013-02-13 NOTE — Telephone Encounter (Signed)
Last visit 06-07-12. Pt is c/o having sore throat, chest congestion, dry cough, chest tightness and slight wheezing x 3 days. Pt has been taking mucinex. Appt set today at 3:45 with TP. [place] Bing, CMA

## 2013-02-13 NOTE — Progress Notes (Signed)
  Subjective:    Patient ID: [name] [name], female    DOB: [birth year], 68 y.o.   MRN: [id number]  HPI  68 year old, white female, with mild intermittent asthma.   02/13/2013 Acute OV  Complains of itchy throat, some chest tightness, dry cough w/ talking, chest congestion, low grade temp x4days, worse last night - denies dyspnea, chills/body aches, nausea, vomiting On Asmanex 1 puff daily , started 2 puffs 2 days ago.  Using Mucinex with some relief. Patient denies any hemoptysis, orthopnea, PND, or leg swelling.  Dx with breast cancer this year, s/p XRT and lumpectomy  Past Medical History:  Current Problems:  ASTHMA, INTERMITTENT, MILD (ICD-493.90)  -FeV1 96% 2004  ASTHMATIC BRONCHITIS, ACUTE (ICD-466.0)  RHINITIS, VASOMOTOR (ICD-477.9)   Review of Systems  Constitutional:   No  weight loss, night sweats,  Fevers, chills, fatigue, or  lassitude.  HEENT:   No headaches,  Difficulty swallowing,  Tooth/dental problems, or  Sore throat,                No sneezing, itching, ear ache, + nasal congestion, post nasal drip,   CV:  No chest pain,  Orthopnea, PND, swelling in lower extremities, anasarca, dizziness, palpitations, syncope.   GI  No heartburn, indigestion, abdominal pain, nausea, vomiting, diarrhea, change in bowel habits, loss of appetite, bloody stools.   Resp:   No coughing up of blood.  No change in color of mucus.  No wheezing.  No chest wall deformity  Skin: no rash or lesions.  GU: no dysuria, change in color of urine, no urgency or frequency.  No flank pain, no hematuria   MS:  No joint pain or swelling.  No decreased range of motion.  No back pain.  Psych:  No change in mood or affect. No depression or anxiety.  No memory loss.         Objective:   Physical Exam  GEN: A/Ox3; pleasant , NAD, well nourished   HEENT:  [place]/AT,  EACs-clear, TMs-wnl, NOSE-clear drainage  THROAT-clear, no lesions, no postnasal drip or exudate noted.   NECK:  Supple w/  fair ROM; no JVD; normal carotid impulses w/o bruits; no thyromegaly or nodules palpated; no lymphadenopathy.  RESP  Clear  P & A; w/o, wheezes/ rales/ or rhonchi.no accessory muscle use, no dullness to percussion  CARD:  RRR, no m/r/g  , no peripheral edema, pulses intact, no cyanosis or clubbing.  GI:   Soft & nt; nml bowel sounds; no organomegaly or masses detected.  Musco: Warm bil, no deformities or joint swelling noted.   Neuro: alert, no focal deficits noted.    Skin: Warm, no lesions or rashes         Assessment & Plan:

## 2013-02-20 ENCOUNTER — Telehealth: Payer: Self-pay | Admitting: Critical Care Medicine

## 2013-02-20 MED ORDER — PREDNISONE 10 MG PO TABS: ORAL_TABLET | ORAL | Status: DC

## 2013-02-20 NOTE — Telephone Encounter (Signed)
Spoke with pt. Aware of PW recs. RX has been sent. Nothing further needed

## 2013-02-20 NOTE — Telephone Encounter (Signed)
Call in prednisone 10mg  Take 4 for two days three for two days two for two days one for two days #20 Needs OV on return to town

## 2013-02-20 NOTE — Telephone Encounter (Signed)
OV with TP 02/13/13: Patient Instructions      Mucinex DM twice daily as needed. For cough and congestion. Saline nasal rinses as needed. Tylenol as needed Fluids and rest Claritin 10 mg daily as needed for drainage Continue on Asmanex 2 puffs daily until back to baseline then resumed 1 puff daily Z-Pak to have on hold if symptoms do not improve or worsen with discolored mucus. follow up Dr. Joya Gaskins  In 3 months and As needed    Please contact office for sooner follow up if symptoms do not improve or worsen or seek emergency care   --  i called and spoke with pt. She started the ZPAK on Saturday. Pt c/o prod cough w/ very little phlem-clear, chest congestion, wheezing, chest tx, PND, nasal congestion. She is taking mucinex DM QD, takes advil. Cough syrup at bedtime. Using her rescue inhaler BID and asmanex BID.  She thinks she needs something else called in. Pt is currently out of town in [place]. Please advise Dr. Joya Gaskins [facility] [phone]   Allergies  Allergen Reactions  . Penicillins Hives  . Pneumovax [Pneumococcal Polysaccharides] Swelling

## 2013-02-21 ENCOUNTER — Telehealth: Payer: Self-pay | Admitting: Critical Care Medicine

## 2013-02-21 NOTE — Telephone Encounter (Signed)
Pt c/o chest congestion, fever 99, cough, wheezing. Pt states that her rescue inhaler is not working. Zpak on hand as been started as of 02/17/13--took last dose today Pt has also began taking Pred taper rxd by PW 02/20/13  Pt scheduled to see Tammy Parrett 02/22/13 at 82  Pt states that she will contact our office if she feels better and feels as though she does not to be seen.

## 2013-02-22 ENCOUNTER — Encounter: Payer: Self-pay | Admitting: Adult Health

## 2013-02-22 ENCOUNTER — Ambulatory Visit (INDEPENDENT_AMBULATORY_CARE_PROVIDER_SITE_OTHER)
Admission: RE | Admit: 2013-02-22 | Discharge: 2013-02-22 | Disposition: A | Discharge status: Home / Self Care | Payer: PRIVATE HEALTH INSURANCE | Source: Ambulatory Visit | Attending: Adult Health | Admitting: Adult Health

## 2013-02-22 ENCOUNTER — Telehealth: Payer: Self-pay | Admitting: Adult Health

## 2013-02-22 ENCOUNTER — Ambulatory Visit (INDEPENDENT_AMBULATORY_CARE_PROVIDER_SITE_OTHER): Payer: PRIVATE HEALTH INSURANCE | Admitting: Adult Health

## 2013-02-22 VITALS — BP 132/70 | HR 71 | Temp 98.0°F | Ht 62.5 in | Wt 122.0 lb

## 2013-02-22 DIAGNOSIS — J45909 Unspecified asthma, uncomplicated: Secondary | ICD-10-CM

## 2013-02-22 DIAGNOSIS — J45901 Unspecified asthma with (acute) exacerbation: Secondary | ICD-10-CM | POA: Insufficient documentation

## 2013-02-22 MED ORDER — HYDROCODONE-HOMATROPINE 5-1.5 MG/5ML PO SYRP: 5.0000 mL | ORAL_SOLUTION | Freq: Four times a day (QID) | ORAL | Status: DC | PRN

## 2013-02-22 MED ORDER — LEVALBUTEROL HCL 0.63 MG/3ML IN NEBU
0.6300 mg | INHALATION_SOLUTION | Freq: Once | RESPIRATORY_TRACT | Status: AC
  Administered 2013-02-22: 0.63 mg via RESPIRATORY_TRACT

## 2013-02-22 MED ORDER — CEFDINIR 300 MG PO CAPS: 300.0000 mg | ORAL_CAPSULE | Freq: Two times a day (BID) | ORAL | Status: DC

## 2013-02-22 NOTE — Addendum Note (Signed)
Addended by: Mathis Bud on: 02/22/2013 12:01 PM   Modules accepted: Orders

## 2013-02-22 NOTE — Assessment & Plan Note (Addendum)
Flare with sinusitis  Check xray today   Plan  Omnicef 300 mg twice daily for 10 days. Mucinex DM twice daily as needed. For cough and congestion Finish prednisone as directed. Hydromet 1 teaspoon every 6 hours as needed. For cough, may make you sleepy Saline nasal rinses as needed. Tylenol as needed Fluids and rest follow up Dr. Joya Gaskins  In 3 months and As needed   Please contact office for sooner follow up if symptoms do not improve or worsen or seek emergency care

## 2013-02-22 NOTE — Patient Instructions (Addendum)
Omnicef 300 mg twice daily for 10 days. Mucinex DM twice daily as needed. For cough and congestion Finish prednisone as directed. Hydromet 1 teaspoon every 6 hours as needed. For cough, may make you sleepy Saline nasal rinses as needed. Tylenol as needed Fluids and rest follow up Dr. Joya Gaskins  In 3 months and As needed   Please contact office for sooner follow up if symptoms do not improve or worsen or seek emergency care

## 2013-02-22 NOTE — Progress Notes (Signed)
  Subjective:    Patient ID: [name] [name], female    DOB: 04-19-1945, 68 y.o.   MRN: [id number]  HPI  68 year old, white female, with mild intermittent asthma.   02/22/2013 Acute OV  Seen 1 week ago with URI , tx w/ zpack and sx tx.  Complains of return of symptoms over last few days.  Finished  zpack yesterday. Started on pred pack 2 days ago. Symptoms improved after last ov, but returned again 5 days ago.  Began Zpak on 1/16 with improvement, began prednisone 1/20.  Complains prod cough with white mucus, increased SOB, wheezing, chest tightness, head congestion w/ pale yellow drainage, PND, bilateral ear congestion w/ sinus pressure.  Denies f/c/s, nausea, vomiting, edema Cant stop coughing, keeping her up at night.    Past Medical History:  Current Problems:  ASTHMA, INTERMITTENT, MILD (ICD-493.90)  -FeV1 96% 2004  ASTHMATIC BRONCHITIS, ACUTE (ICD-466.0)  RHINITIS, VASOMOTOR (ICD-477.9)   Review of Systems  Constitutional:   No  weight loss, night sweats,  Fevers, chills, fatigue, or  lassitude.  HEENT:   No headaches,  Difficulty swallowing,  Tooth/dental problems, or  Sore throat,                No sneezing, itching, ear ache, + nasal congestion, post nasal drip,   CV:  No chest pain,  Orthopnea, PND, swelling in lower extremities, anasarca, dizziness, palpitations, syncope.   GI  No heartburn, indigestion, abdominal pain, nausea, vomiting, diarrhea, change in bowel habits, loss of appetite, bloody stools.   Resp:   No coughing up of blood.  No change in color of mucus.  No wheezing.  No chest wall deformity  Skin: no rash or lesions.  GU: no dysuria, change in color of urine, no urgency or frequency.  No flank pain, no hematuria   MS:  No joint pain or swelling.  No decreased range of motion.  No back pain.  Psych:  No change in mood or affect. No depression or anxiety.  No memory loss.         Objective:   Physical Exam  GEN: A/Ox3; pleasant , NAD, well  nourished   HEENT:  [place]/AT,  EACs-clear, TMs-wnl, NOSE-clear drainage  THROAT-clear, no lesions, no postnasal drip or exudate noted. ,max sinus pressure  NECK:  Supple w/ fair ROM; no JVD; normal carotid impulses w/o bruits; no thyromegaly or nodules palpated; no lymphadenopathy.  RESP  Clear  P & A; w/o, wheezes/ rales/ or rhonchi.no accessory muscle use, no dullness to percussion  CARD:  RRR, no m/r/g  , no peripheral edema, pulses intact, no cyanosis or clubbing.  GI:   Soft & nt; nml bowel sounds; no organomegaly or masses detected.  Musco: Warm bil, no deformities or joint swelling noted.   Neuro: alert, no focal deficits noted.    Skin: Warm, no lesions or rashes         Assessment & Plan:

## 2013-02-22 NOTE — Telephone Encounter (Signed)
Error

## 2013-02-23 ENCOUNTER — Encounter: Payer: Self-pay | Admitting: *Deleted

## 2013-02-27 ENCOUNTER — Telehealth: Payer: Self-pay | Admitting: Critical Care Medicine

## 2013-02-27 MED ORDER — FLUTICASONE PROPIONATE 50 MCG/ACT NA SUSP: 1.0000 | Freq: Every day | NASAL | Status: DC

## 2013-02-27 MED ORDER — PREDNISONE 10 MG PO TABS: ORAL_TABLET | ORAL | Status: DC

## 2013-02-27 MED ORDER — MOXIFLOXACIN HCL 400 MG PO TABS: 400.0000 mg | ORAL_TABLET | Freq: Every day | ORAL | Status: DC

## 2013-02-27 NOTE — Telephone Encounter (Signed)
avelox 400mg  per day. X 7days , use IN ADDITION TO Cefdinir Prednisone 10mg  Take 4 for three days 3 for three days 2 for three days 1 for three days and stop #30 flonase two puff twice daily ea nostril #1 4 RF Mucinex DM 1-2 twice daily as needed [facility] sinus rinse twice daily with distilled water

## 2013-02-27 NOTE — Telephone Encounter (Signed)
Spoke with pt .  She finished Zpak on 02/21/13 then started [facility] and has 5 days left.  Finished Pred taper today.  Pt concerned because she still c/o head congestion, prod cough (clear) and decreased energy.  Pt is using Mucinex DM and OTC cough syrup (Hydromet causes drowsiness).  Pt requesting rx for Tessalon perles and anything else PW may recommend.  PT is scheduled to fly out to [place] this weekend.  Please advise. Allergies  Allergen Reactions  . Penicillins Hives  . Pneumovax [Pneumococcal Polysaccharides] Swelling

## 2013-02-27 NOTE — Telephone Encounter (Signed)
Pt is aware of PW recs. Rx's have been sent in. Nothing further is needed.

## 2013-03-01 ENCOUNTER — Telehealth: Payer: Self-pay | Admitting: Critical Care Medicine

## 2013-03-01 NOTE — Telephone Encounter (Signed)
Called and made pt aware of recs. She voiced her understanding and needed nothing further

## 2013-03-01 NOTE — Telephone Encounter (Signed)
Per CY-take 20 mg qd x 5 days then stop.

## 2013-03-01 NOTE — Telephone Encounter (Signed)
Spoke with pt--pt is having trouble with her Prednisone taper dose Pt was given Rx 02/27/13 for pred taper--supposed to take 40mg  x 3 days, 30mg  x 3, 20mg  x 3, 10mg  x 3 and STOP Patient c/o increased heart rate, feeling jittery and anxious and also some dehydration. I have explained to the patient that the increased heart rate and jittery/anxiety is normal for Prednisone and may be a little increased with higher dosing. Patient instructed to drink plenty of water/Gatorade for her dehydration.  Pt states that she has messed up her dosing of the pred d/t feeling scared bc of her side effect. On 1/27 pt took 40mg  On 1/28 pt tool 30mg   On today pt took 20mg  I have explained to the patient she really needs to follow the taper instructions closely.  Pt is not sure how to get back to her correct dosing since she's already on 20mg  dose today  Dr Annamaria Boots, please advise as Dr Joya Gaskins is not in office. Thanks.

## 2013-03-12 ENCOUNTER — Ambulatory Visit (INDEPENDENT_AMBULATORY_CARE_PROVIDER_SITE_OTHER): Payer: PRIVATE HEALTH INSURANCE | Admitting: Adult Health

## 2013-03-12 ENCOUNTER — Encounter: Payer: Self-pay | Admitting: Adult Health

## 2013-03-12 VITALS — BP 112/68 | HR 64 | Temp 97.9°F | Ht 62.5 in | Wt 123.2 lb

## 2013-03-12 DIAGNOSIS — J45901 Unspecified asthma with (acute) exacerbation: Secondary | ICD-10-CM

## 2013-03-12 MED ORDER — BENZONATATE 200 MG PO CAPS: 200.0000 mg | ORAL_CAPSULE | Freq: Three times a day (TID) | ORAL | Status: DC | PRN

## 2013-03-12 NOTE — Patient Instructions (Signed)
Delsym 2 tsp every 12hrs As needed  Cough  Tessalon Three times a day  As needed  Cough .  Hydromet 1 teaspoon every 6 hours as needed. For cough, may make you sleepy Saline nasal rinses as needed. Fluids and rest follow up Dr. Joya Gaskins  In 3 months and As needed   Please contact office for sooner follow up if symptoms do not improve or worsen or seek emergency care

## 2013-03-12 NOTE — Progress Notes (Signed)
Subjective:    Patient ID: [name] [name], female    DOB: 03-01-1945, 68 y.o.   MRN: [id number]  HPI  68 year old, white female, with mild intermittent asthma.  02/22/13  Acute OV  Seen 1 week ago with URI , tx w/ zpack and sx tx.  Complains of return of symptoms over last few days.  Finished  zpack yesterday. Started on pred pack 2 days ago. Symptoms improved after last ov, but returned again 5 days ago.  Began Zpak on 1/16 with improvement, began prednisone 1/20.  Complains prod cough with white mucus, increased SOB, wheezing, chest tightness, head congestion w/ pale yellow drainage, PND, bilateral ear congestion w/ sinus pressure.  Denies f/c/s, nausea, vomiting, edema Cant stop coughing, keeping her up at night.  >>omnicef and steroids   03/12/2013 Follow up  Returns for a follow up .  Does feel 75% improved since last ov.  would like to discuss Tessalon. Wants to have something during daytime for cough . Has started back to exercise regimen .  Went on vacation to [place] , did well while gone , was able to play golf.  Says it took her a while for cough and congestion to improved. Called back in and was called in Avelox and prednisone taper .  CXR was clear last ov.  Remains on Asmanex daily .  Has lingering dry cough and some nasal stuffiness -but much better.  She denies any hemoptysis, orthopnea, PND, leg swelling, calf pain, discolored mucus, or fever.  Past Medical History:  Current Problems:  ASTHMA, INTERMITTENT, MILD (ICD-493.90)  -FeV1 96% 2004  ASTHMATIC BRONCHITIS, ACUTE (ICD-466.0)  RHINITIS, VASOMOTOR (ICD-477.9)   Review of Systems  Constitutional:   No  weight loss, night sweats,  Fevers, chills, fatigue, or  lassitude.  HEENT:   No headaches,  Difficulty swallowing,  Tooth/dental problems, or  Sore throat,                No sneezing, itching, ear ache, + nasal congestion, post nasal drip,   CV:  No chest pain,  Orthopnea, PND, swelling in lower  extremities, anasarca, dizziness, palpitations, syncope.   GI  No heartburn, indigestion, abdominal pain, nausea, vomiting, diarrhea, change in bowel habits, loss of appetite, bloody stools.   Resp:   No coughing up of blood.  No change in color of mucus.  No wheezing.  No chest wall deformity  Skin: no rash or lesions.  GU: no dysuria, change in color of urine, no urgency or frequency.  No flank pain, no hematuria   MS:  No joint pain or swelling.  No decreased range of motion.  No back pain.  Psych:  No change in mood or affect. No depression or anxiety.  No memory loss.         Objective:   Physical Exam  GEN: A/Ox3; pleasant , NAD, well nourished   HEENT:  [place]/AT,  EACs-clear, TMs-wnl, NOSE-clear drainage  THROAT-clear, no lesions, no postnasal drip or exudate noted. ,max sinus pressure  NECK:  Supple w/ fair ROM; no JVD; normal carotid impulses w/o bruits; no thyromegaly or nodules palpated; no lymphadenopathy.  RESP  Clear  P & A; w/o, wheezes/ rales/ or rhonchi.no accessory muscle use, no dullness to percussion  CARD:  RRR, no m/r/g  , no peripheral edema, pulses intact, no cyanosis or clubbing.  GI:   Soft & nt; nml bowel sounds; no organomegaly or masses detected.  Musco: Warm bil, no deformities or joint swelling  noted.   Neuro: alert, no focal deficits noted.    Skin: Warm, no lesions or rashes         Assessment & Plan:

## 2013-03-12 NOTE — Assessment & Plan Note (Signed)
Slowly resolving -  Plan  Delsym 2 tsp every 12hrs As needed  Cough  Tessalon Three times a day  As needed  Cough .  Hydromet 1 teaspoon every 6 hours as needed. For cough, may make you sleepy Saline nasal rinses as needed. Fluids and rest follow up Dr. Joya Gaskins  In 3 months and As needed   Please contact office for sooner follow up if symptoms do not improve or worsen or seek emergency care

## 2013-05-14 ENCOUNTER — Telehealth: Payer: Self-pay | Admitting: *Deleted

## 2013-05-14 NOTE — Telephone Encounter (Signed)
Pt left message requesting a return call to discuss possible use of Effexor per her last visit with MD.  Per message Bernadine states she was started on gabapentin for side effects relating to hot flashes " but that just was too strong, so you gave me the 100mg  but then I quit using it too, but overall my hot flashes are actually a bit better " " but now I am very emotional during the day and Dr Jana Hakim had discussed the use of Effexor but I feel that I am so sensitive to medications that I want to talk about using it and it's side effects before you call anything in".  Pt left return call number as [phone].  Obtained identified VM but message could not be left.

## 2013-05-16 ENCOUNTER — Telehealth: Payer: Self-pay

## 2013-05-16 NOTE — Telephone Encounter (Signed)
Left  Message for [name] [name] to return call concerning Hot Flashes.

## 2013-05-17 ENCOUNTER — Other Ambulatory Visit: Payer: Self-pay | Admitting: *Deleted

## 2013-05-17 MED ORDER — VENLAFAXINE HCL 37.5 MG PO TABS: 37.5000 mg | ORAL_TABLET | Freq: Two times a day (BID) | ORAL | Status: DC

## 2013-05-17 NOTE — Telephone Encounter (Signed)
This RN spoke with pt per her return call regarding use of effexor for noted feelings of anxiety, not sleeping well and noted emotional changes.  Per discussion this RN discussed potential for mild drowsin

## 2013-06-05 ENCOUNTER — Ambulatory Visit: Payer: PRIVATE HEALTH INSURANCE | Admitting: Certified Nurse Midwife

## 2013-06-05 ENCOUNTER — Ambulatory Visit: Payer: PRIVATE HEALTH INSURANCE | Admitting: Obstetrics & Gynecology

## 2013-06-05 ENCOUNTER — Ambulatory Visit: Payer: PRIVATE HEALTH INSURANCE | Admitting: Obstetrics and Gynecology

## 2013-06-13 ENCOUNTER — Ambulatory Visit: Payer: PRIVATE HEALTH INSURANCE | Admitting: Radiation Oncology

## 2013-06-19 ENCOUNTER — Ambulatory Visit: Payer: PRIVATE HEALTH INSURANCE | Admitting: Radiation Oncology

## 2013-06-19 ENCOUNTER — Ambulatory Visit
Admission: RE | Admit: 2013-06-19 | Discharge: 2013-06-19 | Disposition: A | Discharge status: Home / Self Care | Payer: PRIVATE HEALTH INSURANCE | Source: Ambulatory Visit | Attending: Radiation Oncology | Admitting: Radiation Oncology

## 2013-06-19 ENCOUNTER — Encounter: Payer: Self-pay | Admitting: Radiation Oncology

## 2013-06-19 VITALS — BP 165/69 | HR 53 | Resp 53 | Wt 120.6 lb

## 2013-06-19 DIAGNOSIS — C50411 Malignant neoplasm of upper-outer quadrant of right female breast: Secondary | ICD-10-CM

## 2013-06-19 NOTE — Progress Notes (Signed)
CC: Dr. Gunnar Bulla Magrinat, Dr. Roque Cash  Followup note:  [name] [name] returns today approximately 7 months following completion of radiation therapy after conservative surgery in the management of her high-grade DCIS of the right breast. She saw Dr. Jana Hakim in consultation on 01/11/2013. He started her on 300 mg of gabapentin at nighttime for hot flashes. She found at 300 mg was too sedating. He also start her on venlafaxine for him emotional lability but this made her nauseated. She tells me she was released by Dr. Stasia Cavalier at [facility]. She had a followup MRI scan at [facility] and she underwent a left breast biopsy which was benign (sclerosing adenosis). She is prepared to visit the [facility] in June for bilateral mammography. She will also see Dr. Jana Hakim next month.  His examination: Alert and oriented. Filed Vitals:   06/19/13 1623  BP: 165/69  Pulse: 53  Resp: 53   Head and neck examination: Grossly unremarkable. Nodes: Without palpable cervical or supraclavicular lymphadenopathy. Chest: Lungs clear. Breasts: There is minimal thickening of the right breast. No dominant masses are appreciated. Left breast without masses or lesions. Extremities: Without edema.  Impression: Satisfactory progress.  Plan: She'll see Dr. Jana Hakim in June, and also had mammography at the [facility]. I will release [name] [name] as long she continues to be followed by Dr. Jana Hakim in his team. Dr. Jana Hakim may have other suggestions regarding her hot flashes and emotional lability. I would be more than happy to see this delightful lady in the future should the need arise.

## 2013-06-19 NOTE — Progress Notes (Signed)
Reports that she had a left breast needle biopsy at [facility] on 05/25/2013 following MRI but, the result was negative. (see report). Reports mild fatigue. Reports biopsy site is well healed without redness, drainage or edema. Reports lump at surgical site of right breast remains present. Blood pressure slightly elevated. Denies nipple discharge, edema or irritation of either breast. No edema, numbness, tingling or decreased ROM of either upper extremity noted. Scheduled to follow up with Magrinat on 6/4.

## 2013-06-20 ENCOUNTER — Other Ambulatory Visit: Payer: Self-pay | Admitting: Oncology

## 2013-06-20 ENCOUNTER — Ambulatory Visit: Payer: PRIVATE HEALTH INSURANCE | Admitting: Radiation Oncology

## 2013-06-20 DIAGNOSIS — Z853 Personal history of malignant neoplasm of breast: Secondary | ICD-10-CM

## 2013-06-21 ENCOUNTER — Other Ambulatory Visit: Payer: Self-pay | Admitting: Critical Care Medicine

## 2013-06-22 ENCOUNTER — Telehealth: Payer: Self-pay | Admitting: Critical Care Medicine

## 2013-06-22 MED ORDER — MOMETASONE FUROATE 220 MCG/INH IN AEPB
INHALATION_SPRAY | RESPIRATORY_TRACT | Status: DC
Start: 2013-06-22 — End: 2013-08-23

## 2013-06-22 NOTE — Telephone Encounter (Signed)
Spoke with the pt  She states that she is going to the beach and will be gone until August  I will refill med to last until then  I advised to call in July for August appt with PW since the schedule is not open yet  Rx was sent  Nothing further needed

## 2013-06-22 NOTE — Telephone Encounter (Signed)
Last OV with PW: 06/07/12; asked to f/u in 12 months Was seen by TP last on 03/12/13; rec f/u in 3 mo with PW. No pending appts. I have lmomtcb on home and cell #s to schedule OV with PW.  Once scheduled, will send rx to last pt until OV.

## 2013-06-22 NOTE — Telephone Encounter (Signed)
See RF encounter date 06/21/13

## 2013-06-29 ENCOUNTER — Encounter: Payer: Self-pay | Admitting: Obstetrics & Gynecology

## 2013-07-02 ENCOUNTER — Telehealth: Payer: Self-pay | Admitting: *Deleted

## 2013-07-02 NOTE — Telephone Encounter (Signed)
Left message for pt to return my call so I can speak to her about rescheduling her appt.

## 2013-07-03 ENCOUNTER — Telehealth: Payer: Self-pay | Admitting: *Deleted

## 2013-07-03 NOTE — Telephone Encounter (Signed)
Pt returned my call and I called and left her a message to return my and the reason for it.

## 2013-07-05 ENCOUNTER — Ambulatory Visit (HOSPITAL_BASED_OUTPATIENT_CLINIC_OR_DEPARTMENT_OTHER): Payer: PRIVATE HEALTH INSURANCE | Admitting: Oncology

## 2013-07-05 ENCOUNTER — Ambulatory Visit
Admission: RE | Admit: 2013-07-05 | Discharge: 2013-07-05 | Disposition: A | Discharge status: Home / Self Care | Payer: PRIVATE HEALTH INSURANCE | Source: Ambulatory Visit | Attending: Oncology | Admitting: Oncology

## 2013-07-05 VITALS — BP 138/80 | HR 57 | Temp 98.0°F | Resp 18 | Ht 62.5 in | Wt 121.5 lb

## 2013-07-05 DIAGNOSIS — D0590 Unspecified type of carcinoma in situ of unspecified breast: Secondary | ICD-10-CM

## 2013-07-05 DIAGNOSIS — C50411 Malignant neoplasm of upper-outer quadrant of right female breast: Secondary | ICD-10-CM

## 2013-07-05 DIAGNOSIS — Z853 Personal history of malignant neoplasm of breast: Secondary | ICD-10-CM

## 2013-07-05 NOTE — Addendum Note (Signed)
Addended by: Amelia Jo I on: 07/05/2013 04:00 PM   Modules accepted: Orders

## 2013-07-05 NOTE — Progress Notes (Signed)
ID: [name] [name] OB: 13-Mar-1945  MR#: [id number]  YIR#:[id number]  PCP: Sheela Stack, MD GYN:  Elyse Hsu SU:  OTHER MD: Arloa Koh, Mort Sawyers Servando Salina  CHIEF COMPLAINT: noninvasive breast cancer  HISTORY OF PRESENT ILLNESS: From the original intake note, 01/14/2014:  The patient had routine yearly screening mammography at the breast center 06/20/2012 showing new calcifications in the right breast. Digital right mammography 07/05/2012 showed the breast to be category 2 density. Calcifications in the upper central right breast were confirmed, measuring 9 mm overall. Biopsy of this area 07/05/2012 showed ([id number]) ductal carcinoma in situ, grade 2 or 3, estrogen receptor 100% positive, progesterone receptor 95% positive, both with strong staining intensity.  On 07/10/2012 the patient underwent bilateral breast MRI. Aside from post biopsy changes there were no findings of concern in the right breast. In the central left breast there was a 5 mm enhancing nodule.(However, it appears that on a subsequent study at [facility] the left breast area of concern was not demonstrated.) There was no axillary or internal mammary chain lymphadenopathy present.   The patient then sought evaluation under Dr.Giorgiade at [facility] and on 08/10/2012 underwent needle localization right lumpectomy, with the pathology ([id number]) showing no residual ductal carcinoma in situ, and no evidence of invasive carcinoma.  The patient then met with Dr. Arloa Koh and completed her radiation treatments 11/13/2012 (48 gray in 24 sessions to the right breast). She tolerated treatment well.  Her subsequent history is as detailed below  INTERVAL HISTORY: "[name] [name]" returns today for followup of her ductal carcinoma in situ. Since her last visit here she tried gabapentin 300 mg at bedtime for sleep, and is made her woozy during the day. She then tried the 100 mg tablet once, but decided  against further attempts. She also tried venlafaxine, low-dose, and after taking one pill she had an episode of chills and vomiting for 30 minutes. Clearly neither of those interventions were successful  REVIEW OF SYSTEMS: She is back to exercising regularly. She still has night sweats of course, problems with arthritis, and pain in her feet antibiotics. This is worse when she has been sitting for a while. She feels "flat" as far as her affect is concerned. Aside from these issues, a detailed review of systems today was noncontributory  PAST MEDICAL HISTORY: Past Medical History  Diagnosis Date  . Asthma, mild intermittent   . Acute asthmatic bronchitis   . Vasomotor rhinitis   . Hypothyroidism   . Diverticulosis 2004  . Heart murmur     nl echo  . Fibroids 1998  . History of TMJ disorder   . Fracture of fibula 2007    left;  hit with golf ball  . Hypertension   . Cystocele     Grade 2  . Osteopenia 2002  . Cancer 07/2012    DCIS right breast  . Raynaud's disease   . Breast cancer 08/2012    right  . Hx of radiation therapy 10/11/12- 11/13/12    right breast 4800 cGy 24 sessions    PAST SURGICAL HISTORY: Past Surgical History  Procedure Laterality Date  . Thyroidectomy    . Minor breast biopsy Left     times two  . Rotator cuff repair Right 2010  . Hysteroscopy with resectoscope  2002    submucous fibroid  . Breast surgery Right 08/10/12    Lumpectomy @([facility])    FAMILY HISTORY Family History  Problem Relation Age of Onset  .  Aneurysm Mother   . Hypertension Sister   . Cancer Sister     tongue  . Clotting disorder Maternal Grandfather     liver  . Cancer Maternal Grandfather     liver   the patient's father died of pneumonia at age 66. The patient's mother died from what sounds like her right cured brain aneurysm at the age of 55. The patient had no brothers, 4 sisters. One sister developed tongue cancer. Otherwise there is no history of breast or ovarian cancer in  the family to her knowledge  GYNECOLOGIC HISTORY:  Menarche age 43, first live birth age 55. She is GX P2. Last menstrual period around the year 2000. She used hormone replacement between 2001 and July of 2014.   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 Her husband [name] [name]  owns the local [employer] franchise. Son [name] [name] lives in [place] and works in Insurance underwriter. Son [name] [name] is currently president of [employer]. [[name] [name] had chronic granulomatous disease and received a stem cell transplant from his brother, with excellent results.] The patient has 4 grandchildren. She attends [employer].   ADVANCED DIRECTIVES: In place   HEALTH MAINTENANCE: History  Substance Use Topics  . Smoking status: Never Smoker   . Smokeless tobacco: Never Used  . Alcohol Use: 3.0 - 4.2 oz/week    5-7 Cans of beer per week     Comment: social     Colonoscopy: 2004/[facility]  PAP: May 2012  Bone density: 2011/ showed mild osteopenia per patient report  Lipid panel:  Allergies  Allergen Reactions  . Effexor [Venlafaxine] Nausea And Vomiting  . Penicillins Hives  . Pneumovax [Pneumococcal Polysaccharides] Swelling    Current Outpatient Prescriptions  Medication Sig Dispense Refill  . amLODipine-atorvastatin (CADUET) 5-10 MG per tablet Take 1 tablet by mouth daily.      Marland Kitchen aspirin 81 MG tablet Take 81 mg by mouth daily.      . benzonatate (TESSALON) 200 MG capsule Take 1 capsule (200 mg total) by mouth 3 (three) times daily as needed for cough.  30 capsule  1  . calcium-vitamin D (OSCAL WITH D) 500-200 MG-UNIT per tablet Take 1 tablet by mouth daily.      . cholecalciferol (VITAMIN D) 1000 UNITS tablet Take 1,000 Units by mouth 2 (two) times a week.      . fluticasone (FLONASE) 50 MCG/ACT nasal spray Place 1-2 sprays into both nostrils daily.  16 g  4  . HYDROcodone-homatropine (HYDROMET) 5-1.5 MG/5ML syrup Take 5 mLs by mouth every 6 (six) hours as needed for cough.  240 mL  0  . levothyroxine  (SYNTHROID, LEVOTHROID) 75 MCG tablet Take 75 mcg by mouth daily.      . mometasone (ASMANEX 60 METERED DOSES) 220 MCG/INH inhaler Inhale 1 puff by mouth daily as directed  1 Inhaler  2   No current facility-administered medications for this visit.    OBJECTIVE: Middle-aged white woman who appears well Filed Vitals:   07/05/13 1118  BP: 138/80  Pulse: 57  Temp: 98 F (36.7 C)  Resp: 18     Body mass index is 21.85 kg/(m^2).    ECOG FS:1 - Symptomatic but completely ambulatory  Ocular: Sclerae unicteric Ear-nose-throat: Oropharynx clearand moist Lymphatic: No cervical or supraclavicular adenopathy Lungs no rales or rhonchi Heart regular rate and rhythm Abd soft, nontender, positive bowel sounds MSK no focal spinal tenderness, noupper extremity lymphedema Neuro: non-focal, well-oriented, anxiousaffect Breasts: The right breast is status post lumpectomy and  radiation. There is no evidence of local recurrence. The right axilla is benign. The left breast is unremarkable Skin: 2 mm discoid lupus lesion in the superior left chest wall  LAB RESULTS:  CMP     Component Value Date/Time   NA 143 01/11/2013 1617   NA 133* 03/11/2008 1418   K 3.8 01/11/2013 1617   K 3.6 03/11/2008 1418   CL 102 03/11/2008 1418   CO2 27 01/11/2013 1617   CO2 28 03/11/2008 1418   GLUCOSE 85 01/11/2013 1617   GLUCOSE 75 03/11/2008 1418   BUN 13.9 01/11/2013 1617   BUN 8 03/11/2008 1418   CREATININE 0.7 01/11/2013 1617   CREATININE 0.63 03/11/2008 1418   CALCIUM 9.6 01/11/2013 1617   CALCIUM 8.7 03/11/2008 1418   PROT 8.0 01/11/2013 1617   ALBUMIN 3.8 01/11/2013 1617   AST 30 01/11/2013 1617   ALT 34 01/11/2013 1617   ALKPHOS 108 01/11/2013 1617   BILITOT 0.28 01/11/2013 1617   GFRNONAA >60 03/11/2008 1418   GFRAA  Value: >60        The eGFR has been calculated using the MDRD equation. This calculation has not been validated in all clinical situations. eGFR's persistently <60 mL/min signify possible Chronic Kidney  Disease. 03/11/2008 1418    I No results found for this basename: SPEP,  UPEP,   kappa and lambda light chains    Lab Results  Component Value Date   WBC 5.1 01/11/2013   NEUTROABS 2.9 01/11/2013   HGB 12.6 01/11/2013   HCT 39.6 01/11/2013   MCV 93.3 01/11/2013   PLT 233 01/11/2013      Chemistry      Component Value Date/Time   NA 143 01/11/2013 1617   NA 133* 03/11/2008 1418   K 3.8 01/11/2013 1617   K 3.6 03/11/2008 1418   CL 102 03/11/2008 1418   CO2 27 01/11/2013 1617   CO2 28 03/11/2008 1418   BUN 13.9 01/11/2013 1617   BUN 8 03/11/2008 1418   CREATININE 0.7 01/11/2013 1617   CREATININE 0.63 03/11/2008 1418      Component Value Date/Time   CALCIUM 9.6 01/11/2013 1617   CALCIUM 8.7 03/11/2008 1418   ALKPHOS 108 01/11/2013 1617   AST 30 01/11/2013 1617   ALT 34 01/11/2013 1617   BILITOT 0.28 01/11/2013 1617       No results found for this basename: LABCA2    No components found with this basename: LABCA125    No results found for this basename: INR,  in the last 168 hours  Urinalysis    Component Value Date/Time   BILIRUBINUR n 05/24/2012 1112   PROTEINUR n 05/24/2012 1112   UROBILINOGEN negative 05/24/2012 1112   NITRITE n 05/24/2012 1112   LEUKOCYTESUR Negative 05/24/2012 1112    STUDIES: Mm Digital Diagnostic Bilat  07/05/2013   CLINICAL DATA:  Patient with history of right breast lumpectomy 2014.  EXAM: DIGITAL DIAGNOSTIC  BILATERAL MAMMOGRAM WITH CAD  COMPARISON:  [facility]  ACR Breast Density Category c: The breast tissue is heterogeneously dense, which may obscure small masses.  FINDINGS: Postlumpectomy changes right breast. No concerning masses, calcifications or nonsurgical architectural distortion identified within either breast.  Mammographic images were processed with CAD.  IMPRESSION: Postlumpectomy changes right breast.  No mammographic evidence for malignancy.  RECOMMENDATION: Bilateral diagnostic mammography in 1 year.  I have discussed the findings and  recommendations with the patient. Results were also provided in writing at the conclusion of the visit.  If applicable, a reminder letter will be sent to the patient regarding the next appointment.  BI-RADS CATEGORY  2: Benign Finding(s)   Electronically Signed   By: Lovey Newcomer M.D.   On: 07/05/2013 08:17    ASSESSMENT: 68 y.o. [place] woman  (1) Status post right breast biopsy 07/05/2012 for ductal carcinoma in situ, grade 2 or 3, estrogen receptor 100% positive, progesterone receptor 95% positive.  (2) status post right lumpectomy and margin clearance 08/10/2012 showing no evidence of residual ductal carcinoma in situ  (3) adjuvant radiation therapy completed 11/13/2012  (4) decided against adjuvant or preventive anti-estrogen therapy  PLAN: [name] [name] is getting used to having had a diagnosis of "cancer", even if it was noninvasive. In my experience it takes about 3 years to really incorporate this is new knowledge, even in such benign cases. I have suggested she consider our "finding your new normal" group, which can facilitate that transition.  From a breast cancer point of view, however, I am very comfortable releasing her back to Dr. Forde Dandy. The problems Javaria is going to be facing are going to be related to menopause and aging, not ductal carcinoma in situ. We would be mostly duplicating his efforts.  All she will need as far as breast cancer followup is concerned is her yearly mammographyand a yearly physician breast exam..  She has a good understanding of this and is very comfortable with that decision. Accordingly she "graduated" today. She knows we will keep her records an additional 10 years and during that time she can call us with questions or arrange for a repeat visit if necessary as of now however we are making no further routine appointment for her here.   Chauncey Cruel, MD   07/05/2013 11:36 AM

## 2013-07-23 ENCOUNTER — Encounter: Payer: Self-pay | Admitting: Gynecology

## 2013-07-23 ENCOUNTER — Ambulatory Visit (INDEPENDENT_AMBULATORY_CARE_PROVIDER_SITE_OTHER): Payer: 59 | Admitting: Gynecology

## 2013-07-23 VITALS — Ht 62.0 in | Wt 122.0 lb

## 2013-07-23 DIAGNOSIS — Z01419 Encounter for gynecological examination (general) (routine) without abnormal findings: Secondary | ICD-10-CM

## 2013-07-23 DIAGNOSIS — IMO0002 Reserved for concepts with insufficient information to code with codable children: Secondary | ICD-10-CM

## 2013-07-23 DIAGNOSIS — N8111 Cystocele, midline: Secondary | ICD-10-CM

## 2013-07-23 DIAGNOSIS — Z0000 Encounter for general adult medical examination without abnormal findings: Secondary | ICD-10-CM

## 2013-07-23 NOTE — Patient Instructions (Signed)
Cocoanut oil or vit E Moistened tampon as needed

## 2013-07-23 NOTE — Progress Notes (Signed)
68 y.o. Married Caucasian female   G2P2 here for annual exam. Pt reports menses are absent due to Menopause. She does report hot flashes, does have some  night sweats, does have vaginal dryness.  She is using lubricants, OTC KY JELLY  She does not report post-menopasual bleeding.  Pt was diagnosed with DCIS, s/p RT, overall is doing well.  Pt feels well, no pain.  Pt was on HRT but had to stop, tried effexor once but did not tolerate.    Patient's last menstrual period was 01/02/2000.          Sexually active: yes  The current method of family planning is post menopausal status.    Exercising: yes  Aerobics, walk 5-6/wk Last pap: Abnormal PAP: Mammogram: 07/05/13 Bi-Rads 1 BSE: yes  Colonoscopy: 10 years ago- normal  DEXA: yes 2 1/2 years Alcohol: 1 beer q night Tobacco: no  Labs: Reynold Bowen, MD  Health Maintenance  Topic Date Due  . Tetanus/tdap  12/02/1963  . Colonoscopy  12/02/1994  . Zostavax  12/01/2004  . Pneumococcal Polysaccharide Vaccine Age 16 And Over  12/01/2009  . Influenza Vaccine  09/01/2013  . Mammogram  07/06/2015    Family History  Problem Relation Age of Onset  . Aneurysm Mother   . Hypertension Sister   . Cancer Sister     tongue  . Clotting disorder Maternal Grandfather     liver  . Cancer Maternal Grandfather     liver    Patient Active Problem List   Diagnosis Date Noted  . Asthmatic bronchitis with acute exacerbation 02/22/2013  . Breast cancer of upper-outer quadrant of right female breast 12/25/2012  . Cystocele 05/24/2012  . HYPERTENSION 02/25/2009  . RHINITIS, VASOMOTOR 02/15/2007  . ASTHMA, INTERMITTENT, MILD 02/15/2007    Past Medical History  Diagnosis Date  . Asthma, mild intermittent   . Acute asthmatic bronchitis   . Vasomotor rhinitis   . Hypothyroidism   . Diverticulosis 2004  . Heart murmur     nl echo  . Fibroids 1998  . History of TMJ disorder   . Fracture of fibula 2007    left;  hit with golf ball  . Hypertension    . Cystocele     Grade 2  . Osteopenia 2002  . Cancer 07/2012    DCIS right breast  . Raynaud's disease   . Breast cancer 08/2012    right  . Hx of radiation therapy 10/11/12- 11/13/12    right breast 4800 cGy 24 sessions    Past Surgical History  Procedure Laterality Date  . Thyroidectomy    . Minor breast biopsy Left     times two  . Rotator cuff repair Right 2010  . Hysteroscopy with resectoscope  2002    submucous fibroid  . Breast surgery Right 08/10/12    Lumpectomy @([facility])    Allergies: Effexor; Penicillins; and Pneumovax  Current Outpatient Prescriptions  Medication Sig Dispense Refill  . amLODipine-atorvastatin (CADUET) 5-10 MG per tablet Take 1 tablet by mouth daily.      . calcium-vitamin D (OSCAL WITH D) 500-200 MG-UNIT per tablet Take 1 tablet by mouth daily.      . cholecalciferol (VITAMIN D) 1000 UNITS tablet Take 1,000 Units by mouth 2 (two) times a week.      . levothyroxine (SYNTHROID, LEVOTHROID) 75 MCG tablet Take 75 mcg by mouth daily.      . mometasone (ASMANEX 60 METERED DOSES) 220 MCG/INH inhaler Inhale 1 puff  by mouth daily as directed  1 Inhaler  2   No current facility-administered medications for this visit.    ROS: Pertinent items are noted in HPI.  Exam:    Ht 5\' 2"  (1.575 m)  Wt 122 lb (55.339 kg)  BMI 22.31 kg/m2  LMP 01/02/2000 Weight change: @WEIGHTCHANGE @ Last 3 height recordings:  Ht Readings from Last 3 Encounters:  07/23/13 5\' 2"  (1.575 m)  07/05/13 5' 2.5" (1.588 m)  03/12/13 5' 2.5" (1.588 m)   General appearance: alert, cooperative and appears stated age Head: Normocephalic, without obvious abnormality, atraumatic Neck: no adenopathy, no carotid bruit, no JVD, supple, symmetrical, trachea midline and thyroid not enlarged, symmetric, no tenderness/mass/nodules Lungs: clear to auscultation bilaterally Breasts: normal appearance, no masses or tenderness, right biopsy site healed Heart: regular rate and rhythm, S1, S2 normal,  no murmur, click, rub or gallop Abdomen: soft, non-tender; bowel sounds normal; no masses,  no organomegaly Extremities: extremities normal, atraumatic, no cyanosis or edema Skin: Skin color, texture, turgor normal. No rashes or lesions Lymph nodes: Cervical, supraclavicular, and axillary nodes normal. no inguinal nodes palpated Neurologic: Grossly normal   Pelvic: External genitalia:  no lesions              Urethra: normal appearing urethra with no masses, tenderness or lesions              Bartholins and Skenes: Bartholin's, Urethra, Skene's normal                 Vagina: atrophic, cystocele              Cervix: normal appearance              Pap taken: no        Bimanual Exam:  Uterus:  uterus is normal size, shape, consistency and nontender                                      Adnexa:    no masses                                      Rectovaginal: Confirms                                      Anus:  normal sphincter tone, no lesions  A: well woman Contraceptive management     P: mammogram counseled on breast self exam, menopause, adequate intake of calcium and vitamin D, diet and exercise Cystocele asymptomatic, no voiding issues, can use tampon  For vigorous exercise as needed return annually or prn Discussed PAP guideline changes, importance of weight bearing exercises, calcium, vit D and balanced diet.  An After Visit Summary was printed and given to the patient.

## 2013-08-23 ENCOUNTER — Other Ambulatory Visit: Payer: Self-pay | Admitting: Critical Care Medicine

## 2013-09-07 ENCOUNTER — Ambulatory Visit (INDEPENDENT_AMBULATORY_CARE_PROVIDER_SITE_OTHER): Payer: PRIVATE HEALTH INSURANCE | Admitting: Critical Care Medicine

## 2013-09-07 ENCOUNTER — Encounter: Payer: Self-pay | Admitting: Critical Care Medicine

## 2013-09-07 VITALS — BP 124/68 | HR 53 | Temp 96.8°F | Ht 62.5 in | Wt 122.4 lb

## 2013-09-07 DIAGNOSIS — J45909 Unspecified asthma, uncomplicated: Secondary | ICD-10-CM

## 2013-09-07 MED ORDER — MOMETASONE FUROATE 220 MCG/INH IN AEPB: INHALATION_SPRAY | RESPIRATORY_TRACT | Status: DC

## 2013-09-07 MED ORDER — ALBUTEROL SULFATE 108 (90 BASE) MCG/ACT IN AEPB: 2.0000 | INHALATION_SPRAY | Freq: Four times a day (QID) | RESPIRATORY_TRACT | Status: DC | PRN

## 2013-09-07 NOTE — Progress Notes (Signed)
Subjective:    Patient ID: [name] [name], female    DOB: 10-Jan-1946, 68 y.o.   MRN: [id number]  HPI  68 y.o. , white female, with mild intermittent asthma.  09/07/2013 Chief Complaint  Patient presents with  . Follow-up    Breathing doing well overall.  No SOB, wheezing, chest tightness/pain, or cough at this time.  The patient is doi ng well with minimal complaints. There is no shortness of breath wheezing or chest tightness noted.  Pt denies any significant sore throat, nasal congestion or excess secretions, fever, chills, sweats, unintended weight loss, pleurtic or exertional chest pain, orthopnea PND, or leg swelling Pt denies any increase in rescue therapy over baseline, denies waking up needing it or having any early am or nocturnal exacerbations of coughing/wheezing/or dyspnea. Pt also denies any obvious fluctuation in symptoms with  weather or environmental change or other alleviating or aggravating factors    PUL ASTHMA HISTORY 09/07/2013 06/07/2012  Symptoms 0-2 days/week 0-2 days/week  Nighttime awakenings 0-2/month 0-2/month  Interference with activity No limitations No limitations  SABA use 0-2 days/wk 0-2 days/wk  Exacerbations requiring oral steroids 0-1 / year 0-1 / year    Review of Systems  Constitutional:   No  weight loss, night sweats,  Fevers, chills, fatigue, or  lassitude.  HEENT:   No headaches,  Difficulty swallowing,  Tooth/dental problems, or  Sore throat,                No sneezing, itching, ear ache, + nasal congestion, post nasal drip,   CV:  No chest pain,  Orthopnea, PND, swelling in lower extremities, anasarca, dizziness, palpitations, syncope.   GI  No heartburn, indigestion, abdominal pain, nausea, vomiting, diarrhea, change in bowel habits, loss of appetite, bloody stools.   Resp:   No coughing up of blood.  No change in color of mucus.  No wheezing.  No chest wall deformity  Skin: no rash or lesions.  GU: no dysuria, change in color of  urine, no urgency or frequency.  No flank pain, no hematuria   MS:  No joint pain or swelling.  No decreased range of motion.  No back pain.  Psych:  No change in mood or affect. No depression or anxiety.  No memory loss.     Objective:   Physical Exam BP 124/68  Pulse 53  Temp(Src) 96.8 F (36 C) (Oral)  Ht 5' 2.5" (1.588 m)  Wt 122 lb 6.4 oz (55.52 kg)  BMI 22.02 kg/m2  SpO2 100%  LMP 01/02/2000  GEN: A/Ox3; pleasant , NAD, well nourished   HEENT:  [place]/AT,  EACs-clear, TMs-wnl, NOSE-clear drainage  THROAT-clear, no lesions, no postnasal drip or exudate noted. ,max sinus pressure  NECK:  Supple w/ fair ROM; no JVD; normal carotid impulses w/o bruits; no thyromegaly or nodules palpated; no lymphadenopathy.  RESP  Clear  P & A; w/o, wheezes/ rales/ or rhonchi.no accessory muscle use, no dullness to percussion  CARD:  RRR, no m/r/g  , no peripheral edema, pulses intact, no cyanosis or clubbing.  GI:   Soft & nt; nml bowel sounds; no organomegaly or masses detected.  Musco: Warm bil, no deformities or joint swelling noted.   Neuro: alert, no focal deficits noted.    Skin: Warm, no lesions or rashes     Assessment & Plan:   ASTHMA, INTERMITTENT, MILD Mild intermittent asthma stable at this time Plan Maintain Asmanex 1 puff daily The patient was given a pro air  inhaler to use as needed    Updated Medication List Outpatient Encounter Prescriptions as of 09/07/2013  Medication Sig  . amLODipine-atorvastatin (CADUET) 5-10 MG per tablet Take 1 tablet by mouth daily.  . calcium-vitamin D (OSCAL WITH D) 500-200 MG-UNIT per tablet Take 1 tablet by mouth daily.  . cholecalciferol (VITAMIN D) 1000 UNITS tablet Take 1,000 Units by mouth 2 (two) times a week.  . levothyroxine (SYNTHROID, LEVOTHROID) 75 MCG tablet Take 75 mcg by mouth daily.  . mometasone (ASMANEX 60 METERED DOSES) 220 MCG/INH inhaler INHALE 1 PUFF BY MOUTH EVERY DAY AS DIRECTED  . zolpidem (AMBIEN) 5 MG tablet  Take 5 mg by mouth at bedtime as needed for sleep.  . [DISCONTINUED] ASMANEX 60 METERED DOSES 220 MCG/INH inhaler INHALE 1 PUFF BY MOUTH EVERY DAY AS DIRECTED  . Albuterol Sulfate (PROAIR RESPICLICK) 161 (90 BASE) MCG/ACT AEPB Inhale 2 puffs into the lungs every 6 (six) hours as needed.

## 2013-09-07 NOTE — Assessment & Plan Note (Signed)
Mild intermittent asthma stable at this time Plan Maintain Asmanex 1 puff daily The patient was given a pro air inhaler to use as needed

## 2013-09-07 NOTE — Patient Instructions (Signed)
No changes in medication changes Return 1 yr

## 2013-12-03 ENCOUNTER — Encounter: Payer: Self-pay | Admitting: Critical Care Medicine

## 2014-03-29 ENCOUNTER — Other Ambulatory Visit: Payer: Self-pay | Admitting: Endocrinology

## 2014-04-01 ENCOUNTER — Other Ambulatory Visit: Payer: Self-pay | Admitting: Endocrinology

## 2014-04-01 DIAGNOSIS — M5416 Radiculopathy, lumbar region: Secondary | ICD-10-CM

## 2014-04-01 DIAGNOSIS — M549 Dorsalgia, unspecified: Secondary | ICD-10-CM

## 2014-04-08 ENCOUNTER — Ambulatory Visit
Admission: RE | Admit: 2014-04-08 | Discharge: 2014-04-08 | Disposition: A | Discharge status: Home / Self Care | Payer: PRIVATE HEALTH INSURANCE | Source: Ambulatory Visit | Attending: Endocrinology | Admitting: Endocrinology

## 2014-04-08 DIAGNOSIS — M5416 Radiculopathy, lumbar region: Secondary | ICD-10-CM

## 2014-04-08 DIAGNOSIS — M549 Dorsalgia, unspecified: Secondary | ICD-10-CM

## 2014-05-27 ENCOUNTER — Encounter: Payer: Self-pay | Admitting: Certified Nurse Midwife

## 2014-05-27 ENCOUNTER — Ambulatory Visit (INDEPENDENT_AMBULATORY_CARE_PROVIDER_SITE_OTHER): Payer: 59 | Admitting: Certified Nurse Midwife

## 2014-05-27 VITALS — BP 120/78 | HR 70 | Temp 97.7°F | Resp 16 | Ht 62.25 in | Wt 126.0 lb

## 2014-05-27 DIAGNOSIS — R10819 Abdominal tenderness, unspecified site: Secondary | ICD-10-CM | POA: Diagnosis not present

## 2014-05-27 DIAGNOSIS — B3731 Acute candidiasis of vulva and vagina: Secondary | ICD-10-CM

## 2014-05-27 DIAGNOSIS — B373 Candidiasis of vulva and vagina: Secondary | ICD-10-CM

## 2014-05-27 DIAGNOSIS — N390 Urinary tract infection, site not specified: Secondary | ICD-10-CM

## 2014-05-27 LAB — POCT URINALYSIS DIPSTICK
BILIRUBIN UA: NEGATIVE
Glucose, UA: NEGATIVE
Ketones, UA: NEGATIVE
LEUKOCYTES UA: NEGATIVE
Nitrite, UA: NEGATIVE
PH UA: 5
Protein, UA: NEGATIVE
Urobilinogen, UA: NEGATIVE

## 2014-05-27 LAB — URINALYSIS, MICROSCOPIC ONLY
Bacteria, UA: NONE SEEN
CRYSTALS: NONE SEEN
Casts: NONE SEEN
Squamous Epithelial / LPF: NONE SEEN

## 2014-05-27 MED ORDER — NYSTATIN-TRIAMCINOLONE 100000-0.1 UNIT/GM-% EX CREA: 1.0000 "application " | TOPICAL_CREAM | Freq: Two times a day (BID) | CUTANEOUS | Status: DC

## 2014-05-27 NOTE — Progress Notes (Signed)
69 y.o.Married white female g2p2002 here with complaint of vaginal symptoms of itching, burning when urine touches skin, and slight increase discharge. Patient treated self with Monistat OTC 3 day suppository with relief of internal symptoms. Describes discharge as scant. Feels she has still has symptoms external. Onset of symptoms 5 days ago. Denies new personal products. Recent trip to [place]. Does have vaginal dryness using vitamin E. Using Bio- indentical hormones of testosterone cream and progesterone with MD in [place]. Feels so much better. No STD concerns. Urinary symptoms unsure. Menopausal on HRT.  O:Healthy female WDWN Affect: normal, orientation x 3  Exam:Skin warm and dry CVAT negative bilateral Abdomen:soft, non tender,negative suprapubic Lymph node: no enlargement or tenderness Pelvic exam: External genital: increase pink, especially on right, with exudate and scaling. No lesions noted. Wet prep taken BUS: negative Bladder and urethral meatus non tender Vagina: white non odorous discharge noted. Ph:4.5   ,Wet prep taken,  Cervix: normal, non tender, no CMT Uterus: normal, non tender Adnexa:normal, non tender, no masses or fullness noted   Wet Prep results: positive for yeast external only  A:Normal pelvic exam Yeast vulvitis Poct urine- rbc tr R/O UTI  P:Discussed findings of yeast vulvitis and etiology. Discussed Aveeno or baking soda sitz bath for comfort. Avoid moist clothes or pads for extended period of time. If working out in gym clothes or swim suits for long periods of time change underwear or bottoms of swimsuit if possible. Olive Oil/Coconut Oil use for skin protection prior to activity can be used to external skin. Would recommend coconut oil for vaginal dryness instead of vitamin E. Rx: Mycolog cream with instructions. Increase water intake. Reviewed warnings of UTI and advise if occurring.  Lab: Urine micro/cult.  Rv prn

## 2014-05-27 NOTE — Patient Instructions (Signed)

## 2014-05-28 LAB — URINE CULTURE

## 2014-05-28 NOTE — Progress Notes (Signed)
Reviewed personally.  M. Suzanne Romona Murdy, MD.  

## 2014-05-31 ENCOUNTER — Telehealth: Payer: Self-pay | Admitting: Certified Nurse Midwife

## 2014-05-31 NOTE — Telephone Encounter (Signed)
Spoke with patient. Patient was seen on 05-27-2014 with Regina Eck CNM for vaginal discomfort and itching. Was prescribed mycolog cream BID. Patient states she has been using the mycolog cream and aveeno sitz bath with no relief. "I am still really sore. It is irritated and painful. I do have lupus and sometimes I get patches that hurt like this and I need steroids. I am not sure that this is related to that but I have a patch on my thigh that she saw when I was in the office and now one on my buttock close to my spine. [place] and [place] am so uncomfortable I do not know what to do." Patient is having "sensitivity and irritation" inside vagina as well. Advised will speak with Dr.Silva and return call with further recommendations. Patient is agreeable and verbalizes understanding.

## 2014-05-31 NOTE — Telephone Encounter (Signed)
I recommend a visit to urgent care in the community where she is currently.  Lupus? Poison ivy?

## 2014-05-31 NOTE — Telephone Encounter (Signed)
Spoke with patient. Advised patient of message as seen below from [address]. Patient is agreeable and verbalizes understanding. [place]  Routing to provider for final review. Patient agreeable to disposition. Will close encounter

## 2014-05-31 NOTE — Telephone Encounter (Signed)
Pt says symptoms from Monday are not getting better.

## 2014-05-31 NOTE — Telephone Encounter (Signed)
Spoke with patient. Patient went to local urgent care in the area and is calling to report. "I am so glad I went. I have shingles." Advised I am glad she was able to go and is started on proper treatment. Advised to call if she needs anything.  Routing to Dr.Silva as [employer]

## 2014-06-03 ENCOUNTER — Encounter: Payer: Self-pay | Admitting: Certified Nurse Midwife

## 2014-06-04 ENCOUNTER — Telehealth: Payer: Self-pay | Admitting: Certified Nurse Midwife

## 2014-06-04 MED ORDER — LIDOCAINE HCL 2 % EX GEL: CUTANEOUS | Status: DC

## 2014-06-04 NOTE — Telephone Encounter (Signed)
Reviewed order with Dr. Sandi Carne order for Topical lidocaine 5%. New order received for Lidocaine Jelly 2%. Apply q 6 hours prn pain. Dispense 30 ml. Zero refills.   Order placed to pharmacy of choice.   Message left to return call to [place] at [phone].

## 2014-06-04 NOTE — Telephone Encounter (Signed)
Spoke with patient. She is given instructions regarding use of Lidocaine Jelly 2%. She is advised to use a very small amount to vulvar area. She is advised to call our office tomorrow for update and if not improved, will need office visit for evaluation. Patient agreeable. She feels that lidocaine jelly will really help with staying comfortable during the day and agrees to use only as needed and in very little amounts, no more than directed. She is agreeable to call back with follow up and update. Routing to provider for final review. Patient agreeable to disposition. Patient aware MD will review message and nurse will return call with any additional instructions or change of disposition. Will close encounter.

## 2014-06-04 NOTE — Telephone Encounter (Signed)
Patient sent mychart message with the following as well:   Non-Urgent Medical Question  06/03/2014 8:31 PM Reply  To: [place]   From: Chapman Moss   Created: 06/03/2014 8:31 PM    *-*-*This message has not been handled.*-*-*   Please add these medications to my list...they take the place of the Unknown on the list.. Marland KitchenProgestrone 100MG  SR cap take days 1-25 of month at night Testosterone cream 1 MG/MK apply measured dose to neck morning and night     Patient also called office and spoke with patient about medication reconciliation. Added medications to her medication reconciliation after confirming name, dosages, and routes. These are not prescribed by our office.

## 2014-06-04 NOTE — Telephone Encounter (Signed)
Patient calling requesting to speak with the nurse about a shingles rash she has. She said, "It is still so painful. Is there anything else I can do?" Pharmacy on file is correct.

## 2014-06-04 NOTE — Telephone Encounter (Signed)
Ok to use topical lidocaine ointment 5% every 6 hours.  1.25gm tube.  0RF.

## 2014-06-04 NOTE — Telephone Encounter (Signed)
Message left to return call to [place] at [phone].   To advise patient will need to see PCP for evaluation and treatment of Shingles.

## 2014-06-04 NOTE — Telephone Encounter (Signed)
Pt returned call

## 2014-06-04 NOTE — Telephone Encounter (Signed)
Patient also called office and spoke with patient about medication reconciliation. Added medications to her medication reconciliation after confirming name, dosages, and routes. These are not prescribed by our office.

## 2014-06-04 NOTE — Telephone Encounter (Signed)
Spoke with patient. She was seen in urgent care and dx with shingles on Vulva and on lower back. States that lesions are drying and becoming smaller, however, still has pain to external vulvar area that KY Jelly, Famciclovir, and Motrin do not help. Patient states she cannot tolerate "any strong medication" but wondering if there is anything she could use topically that will help with lesions on vulvar area with pain and irritation.   Advised would review with Dr. Sabra Heck and return call with instructions or office visit disposition. Patient agreeable.

## 2014-06-06 ENCOUNTER — Telehealth: Payer: Self-pay | Admitting: Certified Nurse Midwife

## 2014-06-06 ENCOUNTER — Encounter: Payer: Self-pay | Admitting: Certified Nurse Midwife

## 2014-06-06 ENCOUNTER — Ambulatory Visit (INDEPENDENT_AMBULATORY_CARE_PROVIDER_SITE_OTHER): Payer: 59 | Admitting: Certified Nurse Midwife

## 2014-06-06 ENCOUNTER — Other Ambulatory Visit: Payer: Self-pay | Admitting: Certified Nurse Midwife

## 2014-06-06 VITALS — BP 122/80 | HR 70 | Resp 20 | Ht 62.25 in | Wt 126.0 lb

## 2014-06-06 DIAGNOSIS — A6009 Herpesviral infection of other urogenital tract: Secondary | ICD-10-CM

## 2014-06-06 DIAGNOSIS — A609 Anogenital herpesviral infection, unspecified: Secondary | ICD-10-CM

## 2014-06-06 MED ORDER — VALACYCLOVIR HCL 500 MG PO TABS: ORAL_TABLET | ORAL | Status: DC

## 2014-06-06 NOTE — Progress Notes (Signed)
69 y.o.Married white female g2p2002 here with complaint of  Vulva pain on right side with onset 24 hours ago. Patient was treated a week ago for yeast vulvitis with Mycolog cream and symptoms resolved. [place] of what was diagnosed as Shingles and put on Valtrex, still experiencing some nerve pain in buttock area. Patient feels the pain in vulva area, ? Related.. Denies new personal products. Menopausal. History of HSV oral only No STD concerns. Urinary symptoms none . Contraception is   O:Healthy female WDWN Affect: normal, orientation x 3  Exam: Abdomen:soft Lymph node: no enlargement or tenderness Pelvic exam: External genital: normal female with blisters noted on right vulva, with herpes appearance, tender to touch, culture taken BUS: negative Vagina: scant discharge noted.  Affirm taken Cervix: normal, non tender, no CMT Uterus: normal, non tender Adnexa:normal, non tender, no masses or fullness noted   A:Normal pelvic exam ? Herpes Herpes zoster under treatment with one day left of medication.   P:Discussed findings of herpetic blister appearance and etiology. Discussed Aveeno or baking soda sitz bath for comfort. Discussed could be HSV 1 due to history of. Recommend extending Valtrex to cover current findings. Patient agreeable. Discussed 1/2% hydro cortisone cream to blister area also for comfort. Await culture and affirm results. Patient decided not to use Lidocaine ointment. Questions addressed. Rx Valtrex see order  Rv prn

## 2014-06-06 NOTE — Telephone Encounter (Signed)
Spoke with patient. Patient states she is still in a lot of discomfort. Was seen at local urgent care on 4/29 and diagnosed with shingles. Called in on 5/3 and was prescribed lidocaine jelly by Dr.Miller. Patient was advised to return call if symptoms persist for office visit. Patient would like to be seen today with Regina Eck CNM. Appointment scheduled for today at 10:30am. Patient is agreeable to date and time.  Routing to provider for final review. Patient agreeable to disposition. Patient aware MD will review message and nurse will return call with any additional instructions or change of disposition. Will close encounter.

## 2014-06-06 NOTE — Patient Instructions (Signed)
Genital Herpes °Genital herpes is a sexually transmitted disease. This means that it is a disease passed by having sex with an infected person. There is no cure for genital herpes. The time between attacks can be months to years. The virus may live in a person but produce no problems (symptoms). This infection can be passed to a baby as it travels down the birth canal (vagina). In a newborn, this can cause central nervous system damage, eye damage, or even death. The virus that causes genital herpes is usually HSV-2 virus. The virus that causes oral herpes is usually HSV-1. The diagnosis (learning what is wrong) is made through culture results. °SYMPTOMS  °Usually symptoms of pain and itching begin a few days to a week after contact. It first appears as small blisters that progress to small painful ulcers which then scab over and heal after several days. It affects the outer genitalia, birth canal, cervix, penis, anal area, buttocks, and thighs. °HOME CARE INSTRUCTIONS  °· Keep ulcerated areas dry and clean. °· Take medications as directed. Antiviral medications can speed up healing. They will not prevent recurrences or cure this infection. These medications can also be taken for suppression if there are frequent recurrences. °· While the infection is active, it is contagious. Avoid all sexual contact during active infections. °· Condoms may help prevent spread of the herpes virus. °· Practice safe sex. °· Wash your hands thoroughly after touching the genital area. °· Avoid touching your eyes after touching your genital area. °· Inform your caregiver if you have had genital herpes and become pregnant. It is your responsibility to insure a safe outcome for your baby in this pregnancy. °· Only take over-the-counter or prescription medicines for pain, discomfort, or fever as directed by your caregiver. °SEEK MEDICAL CARE IF:  °· You have a recurrence of this infection. °· You do not respond to medications and are not  improving. °· You have new sources of pain or discharge which have changed from the original infection. °· You have an oral temperature above 102° F (38.9° C). °· You develop abdominal pain. °· You develop eye pain or signs of eye infection. °Document Released: 01/16/2000 Document Revised: 04/12/2011 Document Reviewed: 02/05/2009 °ExitCare® Patient Information ©2015 [employer]. This information is not intended to replace advice given to you by your health care provider. Make sure you discuss any questions you have with your health care provider. ° °

## 2014-06-06 NOTE — Telephone Encounter (Signed)
Patient has shingles and would like to come in for an appointment.

## 2014-06-07 LAB — WET PREP BY MOLECULAR PROBE
CANDIDA SPECIES: NEGATIVE
Gardnerella vaginalis: NEGATIVE
Trichomonas vaginosis: NEGATIVE

## 2014-06-08 NOTE — Progress Notes (Signed)
Reviewed personally.  M. Suzanne Layton Naves, MD.  

## 2014-06-10 LAB — HERPES SIMPLEX VIRUS CULTURE: Organism ID, Bacteria: NOT DETECTED

## 2014-06-11 ENCOUNTER — Telehealth: Payer: Self-pay

## 2014-06-11 NOTE — Telephone Encounter (Signed)
lmtcb

## 2014-06-11 NOTE — Telephone Encounter (Signed)
-----   Message from Regina Eck, CNM sent at 06/11/2014  8:55 AM EDT ----- Notify patient HSV culture was negative, but she was already on medication. Complete medication as we discussed. May want to stay on suppression. Has ov to recheck will discuss then

## 2014-06-11 NOTE — Telephone Encounter (Signed)
Patient is returning a call to [place]. °

## 2014-06-11 NOTE — Telephone Encounter (Signed)
Patient notified of results. See lab 

## 2014-06-13 ENCOUNTER — Ambulatory Visit: Payer: 59 | Admitting: Certified Nurse Midwife

## 2014-06-13 ENCOUNTER — Ambulatory Visit (INDEPENDENT_AMBULATORY_CARE_PROVIDER_SITE_OTHER): Payer: 59 | Admitting: Certified Nurse Midwife

## 2014-06-13 ENCOUNTER — Other Ambulatory Visit: Payer: Self-pay | Admitting: Obstetrics and Gynecology

## 2014-06-13 ENCOUNTER — Encounter: Payer: Self-pay | Admitting: Certified Nurse Midwife

## 2014-06-13 VITALS — BP 122/70 | HR 70 | Resp 16 | Ht 62.25 in | Wt 127.0 lb

## 2014-06-13 DIAGNOSIS — A6000 Herpesviral infection of urogenital system, unspecified: Secondary | ICD-10-CM | POA: Diagnosis not present

## 2014-06-13 DIAGNOSIS — Z853 Personal history of malignant neoplasm of breast: Secondary | ICD-10-CM

## 2014-06-13 DIAGNOSIS — Z9889 Other specified postprocedural states: Secondary | ICD-10-CM

## 2014-06-13 MED ORDER — VALACYCLOVIR HCL 500 MG PO TABS: ORAL_TABLET | ORAL | Status: DC

## 2014-06-13 NOTE — Progress Notes (Signed)
69 y.o. Married Caucasian female G2P2 here for follow up of HSV treated with Valtrex initiated on 5/516. Patient was also diagnosed with Shingles 2 1/2 weeks ago, blisters now resolved. Patient feels the Valtrex has decreased all the pain associated with outbreak. "Only a little twinge now". Vulva feels much better. Use small amount of hydrocortisone on Vulva with good results. No other health issues today.  O: Healthy WD,WN female Affect:normal  Skin:warm and dry Inguinal lymph nodes not enlarged or tender Sacral area of Shingles only shows small amount of pink skin now, no blisters. Abdomen:soft, non tender Pelvic exam:EXTERNAL GENITALIA: normal appearing vulva with no masses, tenderness or lesions, right vulva not edematous and HSV blisters resolved to only 1 now, non tender. No other blisters noted. Declines pelvic exam.  A:Herpes zoster and HSV genital resolving on Valtrex.   P: Discussed findings of all areas resolving with symptoms also. Discussed suppression use with Valtrex, to decrease shedding and frequency of outbreaks. Patient desires suppression. Rx Valtrex see order with instructions. Will advise if having frequent outbreaks with continued use.  Rv prn   Labs   Instructions given regarding:  RV

## 2014-06-13 NOTE — Patient Instructions (Signed)
Genital Herpes °Genital herpes is a sexually transmitted disease. This means that it is a disease passed by having sex with an infected person. There is no cure for genital herpes. The time between attacks can be months to years. The virus may live in a person but produce no problems (symptoms). This infection can be passed to a baby as it travels down the birth canal (vagina). In a newborn, this can cause central nervous system damage, eye damage, or even death. The virus that causes genital herpes is usually HSV-2 virus. The virus that causes oral herpes is usually HSV-1. The diagnosis (learning what is wrong) is made through culture results. °SYMPTOMS  °Usually symptoms of pain and itching begin a few days to a week after contact. It first appears as small blisters that progress to small painful ulcers which then scab over and heal after several days. It affects the outer genitalia, birth canal, cervix, penis, anal area, buttocks, and thighs. °HOME CARE INSTRUCTIONS  °· Keep ulcerated areas dry and clean. °· Take medications as directed. Antiviral medications can speed up healing. They will not prevent recurrences or cure this infection. These medications can also be taken for suppression if there are frequent recurrences. °· While the infection is active, it is contagious. Avoid all sexual contact during active infections. °· Condoms may help prevent spread of the herpes virus. °· Practice safe sex. °· Wash your hands thoroughly after touching the genital area. °· Avoid touching your eyes after touching your genital area. °· Inform your caregiver if you have had genital herpes and become pregnant. It is your responsibility to insure a safe outcome for your baby in this pregnancy. °· Only take over-the-counter or prescription medicines for pain, discomfort, or fever as directed by your caregiver. °SEEK MEDICAL CARE IF:  °· You have a recurrence of this infection. °· You do not respond to medications and are not  improving. °· You have new sources of pain or discharge which have changed from the original infection. °· You have an oral temperature above 102° F (38.9° C). °· You develop abdominal pain. °· You develop eye pain or signs of eye infection. °Document Released: 01/16/2000 Document Revised: 04/12/2011 Document Reviewed: 02/05/2009 °ExitCare® Patient Information ©2015 [employer]. This information is not intended to replace advice given to you by your health care provider. Make sure you discuss any questions you have with your health care provider. ° °

## 2014-06-16 NOTE — Progress Notes (Signed)
Reviewed personally.  M. Suzanne Felix Pratt, MD.  

## 2014-07-16 ENCOUNTER — Ambulatory Visit
Admission: RE | Admit: 2014-07-16 | Discharge: 2014-07-16 | Disposition: A | Discharge status: Home / Self Care | Payer: 59 | Source: Ambulatory Visit | Attending: Obstetrics and Gynecology | Admitting: Obstetrics and Gynecology

## 2014-07-16 DIAGNOSIS — Z9889 Other specified postprocedural states: Secondary | ICD-10-CM

## 2014-07-16 DIAGNOSIS — Z853 Personal history of malignant neoplasm of breast: Secondary | ICD-10-CM

## 2014-10-08 ENCOUNTER — Other Ambulatory Visit: Payer: Self-pay | Admitting: Critical Care Medicine

## 2014-11-01 ENCOUNTER — Telehealth: Payer: Self-pay | Admitting: Certified Nurse Midwife

## 2014-11-01 NOTE — Telephone Encounter (Signed)
[place] and said, "I have bladder prolapse and think I have a urinary tract infection."

## 2014-11-01 NOTE — Telephone Encounter (Signed)
Spoke with patient. Patient is out of town and feels she may have a UTI. States that she has a bladder prolapse and feels this is worsening as well. "I have an appointment to see a specialist for the prolapse next week." States she has been having increased difficulty using the restroom. Feels like she needs to void but is only voiding small amounts. At night she feels she is using the restroom more often than during the day. Denies any lower back pain, fevers, or chills. "It is uncomfortable." Advised patient with being out of town will need to be seen by a local urgent care or [facility] for assessment of possible UTI for proper treatment. Patient is agreeable and will be seen out of town. Would also like to schedule an appointment with Melvia Heaps CNM for follow up if needed for when she returns. Appointment scheduled for 11/05/2014 at 10am with Melvia Heaps CNM. Agreeable to date and time.  Routing to provider for final review. Patient agreeable to disposition. Will close encounter.

## 2014-11-03 ENCOUNTER — Other Ambulatory Visit: Payer: Self-pay | Admitting: Critical Care Medicine

## 2014-11-04 ENCOUNTER — Telehealth: Payer: Self-pay | Admitting: Certified Nurse Midwife

## 2014-11-04 NOTE — Telephone Encounter (Signed)
Patient scheduled an appointment for a UTI but she states she now feels better and does not need to come in.

## 2014-11-05 ENCOUNTER — Ambulatory Visit: Payer: 59 | Admitting: Certified Nurse Midwife

## 2014-11-07 ENCOUNTER — Telehealth: Payer: Self-pay | Admitting: Pulmonary Disease

## 2014-11-07 MED ORDER — MOMETASONE FUROATE 220 MCG/INH IN AEPB: INHALATION_SPRAY | RESPIRATORY_TRACT | Status: DC

## 2014-11-07 NOTE — Telephone Encounter (Signed)
Spoke with pt. Advised her that I will send in rx but she will need to keep upcoming appointment with BQ. She stated that she will keep appointment. Nothing further was needed.

## 2014-11-07 NOTE — Telephone Encounter (Signed)
lmtcb x1 for pt. 

## 2014-11-08 DIAGNOSIS — N949 Unspecified condition associated with female genital organs and menstrual cycle: Secondary | ICD-10-CM | POA: Insufficient documentation

## 2014-11-11 NOTE — Telephone Encounter (Signed)
Patient states she has a bladder prolapse and wants too come in to see DL.

## 2014-11-11 NOTE — Telephone Encounter (Signed)
Call to patient. She states prolapse is worsening and would like to discuss with Melvia Heaps CNM. Denies bladder concerns today, she is able to void and bowel movements as normal.  Declines office visit today with MD. Would like to see Melvia Heaps CNM, only.  Office visit scheduled for tomorrow with Melvia Heaps CNM. Patient agreeable. To call back if cannot void or any concerns.  Routing to provider for final review. Patient agreeable to disposition. Will close encounter.

## 2014-11-12 ENCOUNTER — Encounter: Payer: Self-pay | Admitting: Certified Nurse Midwife

## 2014-11-12 ENCOUNTER — Ambulatory Visit (INDEPENDENT_AMBULATORY_CARE_PROVIDER_SITE_OTHER): Payer: 59 | Admitting: Certified Nurse Midwife

## 2014-11-12 VITALS — BP 100/70 | HR 70 | Resp 16 | Ht 62.25 in | Wt 127.0 lb

## 2014-11-12 DIAGNOSIS — N812 Incomplete uterovaginal prolapse: Secondary | ICD-10-CM

## 2014-11-12 DIAGNOSIS — Z4689 Encounter for fitting and adjustment of other specified devices: Secondary | ICD-10-CM

## 2014-11-12 NOTE — Progress Notes (Signed)
69 y.o. MarriedWhite female G2P2 here for pessary fitting.  Patient has been diagnosed with the following indications warranting pessary use: Cystocele- symptomatic, uterovaginal prolapse. Patient is HRT is being managed by MD in [place]. She is aware of the risk with her previous breast cancer. Mammogram 07/16/14 which was negative. Recent visit for HRT change,with decrease in Vivelle dot dosage.Patient has been aware she had a cystocele, but has had increase pressure to void, no incontinence, but incomplete emptying at times. Discussion regarding pessary use at last visit here and would like to be fitted for one for support. Aware of surgical option for repair and may choose to have a consult regarding. She spoke with another MD in [place] regarding this. Patient denies vaginal bleeding or vaginal dryness. Uses Olive Oil for dryness. No difficulty emptying bowels. Patient has had no further HSV outbreaks since last occurrence. No other concerns.  She has been counseled about other options including pelvic physical therapy, surgical intervention, as well as doing nothing.  She has decided to proceed with pessary use and is here for fitting today.    Patient is sexually active. Urine culture has not been performed. .  Exam: BP 100/70 mmHg  Pulse 70  Resp 16  Ht 5' 2.25" (1.581 m)  Wt 127 lb (57.607 kg)  BMI 23.05 kg/m2  LMP 01/02/2000 General appearance: alert, cooperative and appears stated age no enlarged inguinal lymph nodes or tenderness   Pelvic: External genitalia:  no lesions and slight atrophic appearance              Urethra: normal appearing urethra with no masses, tenderness or lesions              Bartholins and Skenes: Bartholin's, Urethra, Skene's normal                 Vagina: normal appearing vagina with normal color and discharge, no lesions, moisture noted, incomplete uterovaginal prolapse, cystocele grade 3 present at introitus with slight cough              Cervix: normal  appearance Bimanual Exam:  Uterus:  uterus is normal size, shape, consistency and nontender                               Adnexa:    no masses or tenderness                               Anus:  normal sphincter tone, no lesions  Procedure:  Patient fitted with the following pessary and sizes:  Gelhorn,incontinence dish and ring pessary.  After each fitting, patient was advised to redress, ambulate, and attempt to void.  The best fit was with ring pessary size 4, 2 3/4 in.  Pessary was removed without difficulty.  Patient wants to try pessary, aware this is not going to totally stop the pressure she feels.  Assessment:   Incomplete uterovaginal prolapse with grade cystocele   Plan: Pessary will be ordered for patient and she will return to office when in. She will schedule appointment  for pessary pick-up and placement. Information about removing and cleaning pessary was given if patient is going to manage herself, not sure at this point. Questions addressed. Patient will have to continue Olive oil use for vaginal moisture with use.   Rv as above, prn  Aftercare summary was provided.

## 2014-11-12 NOTE — Progress Notes (Signed)
Encounter reviewed Anaelle Dunton, MD   

## 2014-11-14 ENCOUNTER — Encounter: Payer: Self-pay | Admitting: Pulmonary Disease

## 2014-11-14 ENCOUNTER — Ambulatory Visit (INDEPENDENT_AMBULATORY_CARE_PROVIDER_SITE_OTHER): Payer: 59 | Admitting: Pulmonary Disease

## 2014-11-14 VITALS — BP 124/68 | HR 58 | Ht 62.5 in | Wt 128.8 lb

## 2014-11-14 DIAGNOSIS — J4520 Mild intermittent asthma, uncomplicated: Secondary | ICD-10-CM

## 2014-11-14 NOTE — Assessment & Plan Note (Signed)
[name] [name] has had a stable interval since the last visit. Specifically, she tells me she has not had an exacerbation for approximately 2 years. She has used Asmanex steadily since then and has not used albuterol since. This marks significant stability and therefore I don't think she actually needs the Asmanex. I explained to her today that she could start to wean herself off by using the medication every other day. Because she tends to have symptoms more often in the summer I told her that she could resume regular use in the late spring next year. She is going home and think about that.  Her flu shot is up-to-date  Plan: Continue Asmanex for now, but I explained to her that she could stop as detailed above Follow-up one year

## 2014-11-14 NOTE — Patient Instructions (Signed)
Keep taking your Asmanex as you're doing, though as we discussed if you want to stop taking the medication I'm okay with that as long as you resume it again in the spring We will see you back in one year or sooner if needed
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
## 2014-11-21 ENCOUNTER — Telehealth: Payer: Self-pay

## 2014-11-21 NOTE — Telephone Encounter (Signed)
Called patient to let her know pessary has come in. Pt needs appt for placement.

## 2014-11-21 NOTE — Telephone Encounter (Signed)
appt 11-27-14 at 10am for insertion of new pessary

## 2014-11-21 NOTE — Telephone Encounter (Signed)
Patient returning your call.

## 2014-11-27 ENCOUNTER — Encounter: Payer: Self-pay | Admitting: Certified Nurse Midwife

## 2014-11-27 ENCOUNTER — Ambulatory Visit (INDEPENDENT_AMBULATORY_CARE_PROVIDER_SITE_OTHER): Payer: 59 | Admitting: Certified Nurse Midwife

## 2014-11-27 VITALS — BP 122/76 | HR 72 | Resp 16 | Ht 62.25 in | Wt 128.0 lb

## 2014-11-27 DIAGNOSIS — N812 Incomplete uterovaginal prolapse: Secondary | ICD-10-CM

## 2014-11-27 DIAGNOSIS — Z4689 Encounter for fitting and adjustment of other specified devices: Secondary | ICD-10-CM

## 2014-11-27 DIAGNOSIS — N8110 Cystocele, unspecified: Secondary | ICD-10-CM

## 2014-11-27 DIAGNOSIS — IMO0002 Reserved for concepts with insufficient information to code with codable children: Secondary | ICD-10-CM

## 2014-11-27 NOTE — Progress Notes (Signed)
69 y.o. MarriedWhite female  G2P2 here for pessary fitting.  Patient has been diagnosed with the following indications warranting pessary use: Cystocele- symptomatic uterovaginal prolapse. .    She reports these associated symptoms: Vaginal discharge: no Vaginal pressure:  yes Urinary symptoms:   urinary retention.   Other:  Vaginal dryness, but using Olive Oil with good results  She has been counseled about other options including pelvic physical therapy, surgical intervention, as well as doing nothing.  She has decided to proceed with pessary use and is here for fitting today.    Patient is sexually active. Urine culture pending  Exam: BP 122/76 mmHg  Pulse 72  Resp 16  Ht 5' 2.25" (1.581 m)  Wt 128 lb (58.06 kg)  BMI 23.23 kg/m2  LMP 01/02/2000 General appearance: alert, cooperative, appears stated age and no distress inguinal lymph nodes non tender, no enlargement   Pelvic: External genitalia:  no lesions and atrophic appearance              Urethra: not indicated and normal appearing urethra with no masses, tenderness or lesions              Bartholins and Skenes: Bartholin's, Urethra, Skene's normal                 Vagina: normal appearing vagina with normal color and discharge, no lesions              Cervix: normal appearance and non tender Bimanual Exam:  Uterus:  uterus is normal size, shape, consistency and nontender                Adnexa:    normal adnexa in size, nontender and no masses                 Anus:  normal sphincter tone, no lesions  Procedure:  Patient fitted with the following pessary and size  Milex ring pessary size 4, 2 3/4 inch.  After  fitting, patient was advised to redress, ambulate, and attempt to void.  Patient sat and noted pain or pressure. Patient voided completely and felt much better. Patient taught how to remove and insert pessary. Patient removed and inserted pessary and voided after insertion without problems. Provider checked behind patient  also.  Assessment:   Normal Pelvic exam,  Cystocele- symptomatic with utero vaginal prolapse Good candidate for pessary use.  Plan: Patient will leave pessary in and remove at home in 24 hours unless problems. Will reinsert and wear until OV in 2 days. If discomfort or problems will remove pessary and advise. Instructed to use Olive oil as moisturizer twice daily. Warning signs with pessary use reviewed. Questions addressed.  Rv 2 days

## 2014-11-28 LAB — URINE CULTURE: Colony Count: 3000

## 2014-11-29 ENCOUNTER — Ambulatory Visit (INDEPENDENT_AMBULATORY_CARE_PROVIDER_SITE_OTHER): Payer: 59 | Admitting: Certified Nurse Midwife

## 2014-11-29 ENCOUNTER — Encounter: Payer: Self-pay | Admitting: Certified Nurse Midwife

## 2014-11-29 VITALS — BP 130/80 | HR 60 | Resp 14 | Wt 128.0 lb

## 2014-11-29 DIAGNOSIS — Z4689 Encounter for fitting and adjustment of other specified devices: Secondary | ICD-10-CM | POA: Diagnosis not present

## 2014-11-29 NOTE — Progress Notes (Signed)
70 y.o. MarriedWhite female G2P2 here for pessary check.  Patient has been using following pessary style and size:  4, 2 3/4 ring pessary with support, which was fitted 3 days ago. She has taken it out once and used Olive oil for moisture, reinserted and has worn consistently for the past 2 days.  She is sexually active.  She describes the no issues with the pessary:  Denies vaginal pain or discomfort or trouble with removing or inserting pessary. "I am able to completely empty my bladder now". No frequent trips to bathroom to empty bladder during night per patient. "Feels like this is a good choice". No concerns with self use or using Olive Oil or coconut oil use for moisture. No other health concerns today.  ROS: No pelvic pain or bleeding or dysuria. No vaginal discomfort or dryness. Urine culture in from fitting visit negative.  Exam:   BP 130/80 mmHg  Pulse 60  Resp 14  Wt 128 lb (58.06 kg)  LMP 01/02/2000 General appearance: alert, cooperative, appears stated age and no distress no enlarged or tender inguinal lymph nodes   Pelvic: External genitalia:  no lesions and atrophic appearance              Urethra: normal appearing urethra with no masses, tenderness or lesions, Bladder or urethral meatus non tender              Bartholins and Skenes: Bartholin's, Urethra, Skene's normal                 Vagina: normal appearing vagina with normal color and discharge, no lesions, pessary noted in place, removed without difficulty, no excoriations ,ulcerations or redness noted. Cystocele grade 3 and uterovaginal prolapse  noted               Cervix: normal appearance and non tender Bimanual Exam:  Uterus:  uterus is normal size, shape, consistency and nontender,                               Adnexa:    normal adnexa in size, nontender and no masses                               Anus:  normal sphincter tone, no lesions  Pessary was removed without difficulty.  Pessary was cleansed.  Pessary was  replaced by patient and checked for position. Position not correct, patient removed and reinserted and rechecked and in correct position. Patient now feels very comfortable with pessary use at this point. . Patient tolerated procedure well.  Patient voided after final insertion and sat and moved around with no concerns.  A: Cystocele- symptomatic with uterovaginal prolapse with Pessary use for support      Normal surveillance after fitting      Urine culture negative  P:Reviewed warning signs with pessary use and need to advise. Patient voiced understanding. Stressed importance of moisturizer to prevent ulceration or excoriation or irritation    Return to office in 1 month for recheck. Or prn    An After Visit Summary was printed and given to the patient.

## 2014-11-29 NOTE — Patient Instructions (Addendum)
If you have any bleeding or pain or problems with emptying bladder or bowels, you should be seen.  Be sure to use your moisturizer before and after insertion.  Clean pessary with soap and water when removed. Store in an uncovered area. Call if any problems. Debbi

## 2014-12-01 ENCOUNTER — Telehealth: Payer: Self-pay | Admitting: Obstetrics & Gynecology

## 2014-12-01 NOTE — Telephone Encounter (Signed)
Phone note opened in error.  

## 2014-12-01 NOTE — Progress Notes (Signed)
Reviewed personally.  M. Suzanne Ranada Vigorito, MD.  

## 2014-12-01 NOTE — Progress Notes (Signed)
Reviewed personally.  M. Suzanne Czar Ysaguirre, MD.  

## 2014-12-03 ENCOUNTER — Other Ambulatory Visit: Payer: Self-pay | Admitting: Pulmonary Disease

## 2014-12-13 ENCOUNTER — Ambulatory Visit: Payer: 59 | Admitting: Certified Nurse Midwife

## 2015-01-01 ENCOUNTER — Ambulatory Visit: Payer: 59 | Admitting: Certified Nurse Midwife

## 2015-01-09 ENCOUNTER — Ambulatory Visit (INDEPENDENT_AMBULATORY_CARE_PROVIDER_SITE_OTHER): Payer: 59 | Admitting: Certified Nurse Midwife

## 2015-01-09 ENCOUNTER — Encounter: Payer: Self-pay | Admitting: Certified Nurse Midwife

## 2015-01-09 VITALS — BP 118/70 | HR 72 | Resp 16 | Ht 62.25 in | Wt 128.0 lb

## 2015-01-09 DIAGNOSIS — Z4689 Encounter for fitting and adjustment of other specified devices: Secondary | ICD-10-CM

## 2015-01-09 NOTE — Progress Notes (Signed)
69 y.o. Married Caucasian female G2P2 here for follow up of pessary size 4, 2 3/4 ring pessary with support inserted on 11/29/14 for symptomatic cystocele. Patient managing her pessary without problems. Using Olive oil for moisture without problems. Denies any vaginal bleeding, discharge or odor. Denies any urinary symptoms. Now can empty bladder completely!! Happy with decision to use pessary. No health issues today.  O: Healthy WD,WN female Affect: normal orientation x 3 Abdomen:soft, non tender Pelvic exam:EXTERNAL GENITALIA: normal appearing vulva with no masses, tenderness or lesions VAGINA: no abnormal discharge or lesions and moist, no excoriations or ulcerations. Pessary noted in place and removed CERVIX: no lesions or cervical motion tenderness and normal, no ulcerations or excoriations UTERUS: normal, non tender ADNEXA: no masses palpable, nontender and adnexal non tender RECTUM: normal  A:Cystocele symptomatic with uterovaginal prolapse with pessary use for support Normal surveillance   P: Discussed findings of normal pelvic exam and no issues noted with pessary use. Warning signs given and need to advise if occurs.  Rv prn and with aex( due)

## 2015-01-15 NOTE — Progress Notes (Signed)
Reviewed personally.  M. Suzanne Ariani Seier, MD.  

## 2015-02-21 ENCOUNTER — Ambulatory Visit: Payer: 59 | Admitting: Certified Nurse Midwife

## 2015-03-04 ENCOUNTER — Telehealth: Payer: Self-pay | Admitting: Certified Nurse Midwife

## 2015-03-04 NOTE — Telephone Encounter (Signed)
If she is having difficulty completely emptying she can insert, would leave out at least until feeling better.

## 2015-03-04 NOTE — Telephone Encounter (Signed)
Patient is out of town in [place] and thinks she has an UTI. She wants to talk with the nurse about having something called in to a pharmacy in [place].

## 2015-03-04 NOTE — Telephone Encounter (Signed)
Patient called in this morning with UTI symptoms (please see telephone note below). She was seen at a local urgent care in [place] and was prescribed Cipro 500 mg bid for 10 days. She removed her pessary when her symptoms began. She would like to know if can insert her pessary while having a UTI and receiving treatment.  Routing to [employer] CNM for review and advise.

## 2015-03-04 NOTE — Telephone Encounter (Signed)
Spoke with patient. Advised of message as seen below form Melvia Heaps CNM. She is agreeable and verbalizes understanding. She denies any current difficulty emptying her bladder. She will keep her pessary out at this time until she feels better. If she develops and trouble emptying her bladder she will insert her pessary.  Routing to provider for final review. Patient agreeable to disposition. Will close encounter.

## 2015-03-04 NOTE — Telephone Encounter (Signed)
Patient calling to give Urinalysis results  Color: yellow Clarity: Clear Specific Gravity: 1.010 PH: 7.0 Blood: Trace/Intact Leuks: Neg Nitrates: Neg Protein: Neg Bilirubin: Neg Ketones: Neg Glucose: Neg Urobilinogen: Neg  Patient says she was prescribed Cipro 500 mg bid for 10 days. Patient wants to know if it is all right to insert her pessary since she has a uti?  Best # to reach: [phone]

## 2015-03-04 NOTE — Telephone Encounter (Signed)
Call to patient. She c/o feelings of pelvic cramping and bloating in abdomen for one day. No pain with urination, no burning feelings with urination as with previous UTI's. Felt as though she may have chills yesterday but she is out of town in [place] and does not have a thermometer. She took her pessary out yesterday and has not replaced it. She states she is able to void like normal. No change in bowel symptoms.  Advised recommendation is to be seen at local urgent care for evaluation.  Patient is agreeable to this and understands reasoning for urgent care recommendation.  She will seek care and follow up with our office as scheduled.  Follow up appointment here in our office at 04/03/15 and appointment with [facility] 04/08/15.  She will call back if she would like earlier appointment in our office or with any concerns. Routing to provider for final review. Patient agreeable to disposition. Will close encounter.

## 2015-03-24 ENCOUNTER — Telehealth: Payer: Self-pay | Admitting: Pulmonary Disease

## 2015-03-24 MED ORDER — MOMETASONE FUROATE 220 MCG/INH IN AEPB: INHALATION_SPRAY | RESPIRATORY_TRACT | Status: DC

## 2015-03-24 NOTE — Telephone Encounter (Signed)
Called spoke with pt. She needed her asmanex called into [facility] in [place] at [facility]. I have sent this refill in. Nothing further is needed

## 2015-04-04 ENCOUNTER — Encounter: Payer: Self-pay | Admitting: Certified Nurse Midwife

## 2015-04-04 ENCOUNTER — Ambulatory Visit (INDEPENDENT_AMBULATORY_CARE_PROVIDER_SITE_OTHER): Payer: 59 | Admitting: Certified Nurse Midwife

## 2015-04-04 VITALS — BP 118/68 | HR 68 | Resp 16 | Ht 62.25 in | Wt 127.0 lb

## 2015-04-04 DIAGNOSIS — Z124 Encounter for screening for malignant neoplasm of cervix: Secondary | ICD-10-CM

## 2015-04-04 DIAGNOSIS — Z0000 Encounter for general adult medical examination without abnormal findings: Secondary | ICD-10-CM | POA: Diagnosis not present

## 2015-04-04 DIAGNOSIS — N814 Uterovaginal prolapse, unspecified: Secondary | ICD-10-CM | POA: Diagnosis not present

## 2015-04-04 DIAGNOSIS — Z01419 Encounter for gynecological examination (general) (routine) without abnormal findings: Secondary | ICD-10-CM

## 2015-04-04 DIAGNOSIS — Z4689 Encounter for fitting and adjustment of other specified devices: Secondary | ICD-10-CM

## 2015-04-04 LAB — POCT URINALYSIS DIPSTICK
BILIRUBIN UA: NEGATIVE
GLUCOSE UA: NEGATIVE
Ketones, UA: NEGATIVE
LEUKOCYTES UA: NEGATIVE
NITRITE UA: NEGATIVE
Protein, UA: NEGATIVE
RBC UA: NEGATIVE
UROBILINOGEN UA: NEGATIVE
pH, UA: 5
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
## 2015-04-04 NOTE — Patient Instructions (Addendum)
EXERCISE AND DIET:  We recommended that you start or continue a regular exercise program for good health. Regular exercise means any activity that makes your heart beat faster and makes you sweat.  We recommend exercising at least 30 minutes per day at least 3 days a week, preferably 4 or 5.  We also recommend a diet low in fat and sugar.  Inactivity, poor dietary choices and obesity can cause diabetes, heart attack, stroke, and kidney damage, among others.    ALCOHOL AND SMOKING:  Women should limit their alcohol intake to no more than 7 drinks/beers/glasses of wine (combined, not each!) per week. Moderation of alcohol intake to this level decreases your risk of breast cancer and liver damage. And of course, no recreational drugs are part of a healthy lifestyle.  And absolutely no smoking or even second hand smoke. Most people know smoking can cause heart and lung diseases, but did you know it also contributes to weakening of your bones? Aging of your skin?  Yellowing of your teeth and nails?  CALCIUM AND VITAMIN D:  Adequate intake of calcium and Vitamin D are recommended.  The recommendations for exact amounts of these supplements seem to change often, but generally speaking 600 mg of calcium (either carbonate or citrate) and 800 units of Vitamin D per day seems prudent. Certain women may benefit from higher intake of Vitamin D.  If you are among these women, your doctor will have told you during your visit.    PAP SMEARS:  Pap smears, to check for cervical cancer or precancers,  have traditionally been done yearly, although recent scientific advances have shown that most women can have pap smears less often.  However, every woman still should have a physical exam from her gynecologist every year. It will include a breast check, inspection of the vulva and vagina to check for abnormal growths or skin changes, a visual exam of the cervix, and then an exam to evaluate the size and shape of the uterus and  ovaries.  And after 71 years of age, a rectal exam is indicated to check for rectal cancers. We will also provide age appropriate advice regarding health maintenance, like when you should have certain vaccines, screening for sexually transmitted diseases, bone density testing, colonoscopy, mammograms, etc.   MAMMOGRAMS:  All women over 40 years old should have a yearly mammogram. Many facilities now offer a "3D" mammogram, which may cost around $50 extra out of pocket. If possible,  we recommend you accept the option to have the 3D mammogram performed.  It both reduces the number of women who will be called back for extra views which then turn out to be normal, and it is better than the routine mammogram at detecting truly abnormal areas.    COLONOSCOPY:  Colonoscopy to screen for colon cancer is recommended for all women at age 50.  We know, you hate the idea of the prep.  We agree, BUT, having colon cancer and not knowing it is worse!!  Colon cancer so often starts as a polyp that can be seen and removed at colonscopy, which can quite literally save your life!  And if your first colonoscopy is normal and you have no family history of colon cancer, most women don't have to have it again for 10 years.  Once every ten years, you can do something that may end up saving your life, right?  We will be happy to help you get it scheduled when you are ready.    Be sure to check your insurance coverage so you understand how much it will cost.  It may be covered as a preventative service at no cost, but you should check your particular policy.     Genital Herpes Genital herpes is a common sexually transmitted infection (STI) that is caused by a virus. The virus is spread from person to person through sexual contact. Infection can cause itching, blisters, and sores in the genital area or rectal area. This is called an outbreak. It affects both men and women. Genital herpes is particularly concerning for pregnant women  because the virus can be passed to the baby during delivery and cause serious problems. Genital herpes is also a concern for people with a weakened defense (immune) system. Symptoms of genital herpes may last several days and then go away. However, the virus remains in your body, so you may have more outbreaks of symptoms in the future. The time between outbreaks varies and can be months or years. CAUSES Genital herpes is caused by a virus called herpes simplex virus (HSV) type 2 or HSV type 1. These viruses are contagious and are most often spread through sexual contact with an infected person. Sexual contact includes vaginal, anal, and oral sex. RISK FACTORS Risk factors for genital herpes include:  Being sexually active with multiple partners.  Having unprotected sex. SIGNS AND SYMPTOMS Symptoms may include:  Pain and itching in the genital area or rectal area.  Small red bumps that turn into blisters and then turn into sores.  Flu-like symptoms, including:  Fever.  Body aches.  Painful urination.  Vaginal discharge. DIAGNOSIS Genital herpes may be diagnosed by:  Physical exam.  Blood test.  Fluid culture test from an open sore. TREATMENT There is no cure for genital herpes. Oral antiviral medicines may be used to speed up healing and to help prevent the return of symptoms. These medicines can also help to reduce the spread of the virus to sexual partners. HOME CARE INSTRUCTIONS  Keep the affected areas dry and clean.  Take medicines only as directed by your health care provider.  Do not have sexual contact during active infections. Genital herpes is contagious.  Practice safe sex. Latex condoms and female condoms may help to prevent the spread of the herpes virus.  Avoid rubbing or touching the blisters and sores. If you do touch the blister or sores:  Wash your hands thoroughly.  Do not touch your eyes afterward.  If you become pregnant, tell your health care  provider if you have had genital herpes.  Keep all follow-up visits as directed by your health care provider. This is important. PREVENTION  Use condoms. Although anyone can contract genital herpes during sexual contact even with the use of a condom, a condom can provide some protection.  Avoid having multiple sexual partners.  Talk to your sexual partner about any symptoms and past history that either of you may have.  Get tested before you have sex. Ask your partner to do the same.  Recognize the symptoms of genital herpes. Do not have sexual contact if you notice these symptoms. SEEK MEDICAL CARE IF:  Your symptoms are not improving with medicine.  Your symptoms return.  You have new symptoms.  You have a fever.  You have abdominal pain.  You have redness, swelling, or pain in your eye. MAKE SURE YOU:  Understand these instructions.  Will watch your condition.  Will get help right away if you are not doing well  or get worse.   This information is not intended to replace advice given to you by your health care provider. Make sure you discuss any questions you have with your health care provider.   Document Released: 01/16/2000 Document Revised: 02/08/2014 Document Reviewed: 06/05/2013 Elsevier Interactive Patient Education [employer].

## 2015-04-07 LAB — IPS PAP SMEAR ONLY

## 2015-04-09 NOTE — Progress Notes (Signed)
Encounter reviewed Jill Jertson, MD   

## 2015-04-16 ENCOUNTER — Telehealth: Payer: Self-pay | Admitting: Certified Nurse Midwife

## 2015-04-16 DIAGNOSIS — N814 Uterovaginal prolapse, unspecified: Secondary | ICD-10-CM | POA: Diagnosis not present

## 2015-04-16 NOTE — Telephone Encounter (Signed)
Patient called and said, "I have some questions for the nurse about some problems with my pessary."

## 2015-04-16 NOTE — Telephone Encounter (Signed)
She could go to local pharmacy and see if order placed they could order for her or dispense. May need to check with Gay Filler to see if mailing to patient an option.

## 2015-04-16 NOTE — Telephone Encounter (Signed)
Spoke with patient. Patient states that she was seen by a physician at [facility] for evaluation and discussion of bladder/uterine prolapse surgery on 04/08/2015. Patient was fitted with a new pessary at that appointment. States new pessary has a knob.  "It fit well for 1-2 days, but now it keeps falling out every time I put it in." Patient is currently in [place] and does not have her old pessary with her. Patient is asking if a new pessary can be ordered by our office for the same size Melvia Heaps CNM fitted her for. "The one I was using that Debbi fitted me for was working 80 percent of the time. I just need a new pessary until I can get back home. I am scheduled to see [facility] again on April 17th and I am going to proceed with surgery after that." Advised patient when we order a pessary for a patient the patient is responsible to pick the pessary up from the office. Patient is asking if a pessary can be sent in the mail to her in [place]. Advised I will need to speak with Melvia Heaps CNM and return phone call with further recommendations.

## 2015-04-16 NOTE — Telephone Encounter (Signed)
Return call to patient. She currently has Milex Size 4 Ring with support. This is only available from [facility] which will only direct ship to provider office.  It is not available with prescription. After review with Debbi, we can dispense Miltex brand size 4 Ring pessary with support. Family member may pick this up and ship it to patient.  Patient notified and is agreeable to this. She is advised that due to difference in brands, may notice slight variation in firmness but size and shape are otherwise interchangeable.  Patient will have her son pick this up today. Left at front desk for pick-up.   Routing to provider for final review. Patient agreeable to disposition. Will close encounter.

## 2015-05-06 ENCOUNTER — Encounter: Payer: Self-pay | Admitting: Certified Nurse Midwife

## 2015-05-06 ENCOUNTER — Ambulatory Visit (INDEPENDENT_AMBULATORY_CARE_PROVIDER_SITE_OTHER): Payer: 59 | Admitting: Certified Nurse Midwife

## 2015-05-06 VITALS — BP 118/80 | HR 72 | Resp 16 | Ht 62.25 in | Wt 130.0 lb

## 2015-05-06 DIAGNOSIS — Z4689 Encounter for fitting and adjustment of other specified devices: Secondary | ICD-10-CM | POA: Diagnosis not present

## 2015-05-06 DIAGNOSIS — N814 Uterovaginal prolapse, unspecified: Secondary | ICD-10-CM | POA: Diagnosis not present

## 2015-05-06 NOTE — Progress Notes (Signed)
Subjective:   70 y.o. MarriedWhite female G2P2 here for pessary check.  Patient has been using following pessary style and size:  4 ,2 3/4.  She describes the following issues with the pessary:  None, just would like not having to use, but can not empty bladder well without it being in.. Patient has decided to have surgery at [facility] for LAVH and bladder repair in May 2017. Happy with finally making decision. Now considering stopping HRT managed by Baldo Ash MD.   Use of protective clothing such as Depends or pads Yes.  . Problems with protective clothing with rash No..  Constipation issues with use of pessary No..  She is not sexually active.     ROS:    no abnormal bleeding, pelvic pain or discharge, no dysuria, trouble voiding or hematuria   no dysuria, trouble voiding or hematuria. Compliant to use of vaginal cream Yes.   General Exam:    BP 118/80 mmHg  Pulse 72  Resp 16  Ht 5' 2.25" (1.581 m)  Wt 130 lb (58.968 kg)  BMI 23.59 kg/m2  LMP 01/02/2000  General appearance: alert, cooperative and appears stated age   Pelvic: External genitalia:  no lesions and atrophic appearance   Before pessary was removed No. prolapse over the pessary   In correct position Yes.                Urethra: normal appearing urethra with no masses, tenderness or lesions              Vagina: normal appearing vagina with normal color and discharge, no lesions, grade 3 cystocele with uterine prolapse note..  There are No abrasions or ulcerations.               Cervix: normal appearance Cervical lesions were not found   Bimanual Exam:  Uterus:  normal size, contour, position, consistency, mobility, non-tender and prolapsed incomplete"uterus is normal size, shape, consistency and nontender"}                               Adnexa:    normal adnexa in size, nontender and no masses                             Pessary was removed without difficulty without using forceps.  Pessary was cleansed with Betadine.  Pessary  was replaced. Patient tolerated procedure well.    Assessment :  Normal exam, Cystocele- symptomatic        Uterine prolapse incomplete   Use of pessary continued until [facility] and cystocele repair, then not needed.   Plan:   Aex, prn   An After Visit Summary was printed and given to the patient.

## 2015-05-08 NOTE — Progress Notes (Signed)
Encounter reviewed Jill Jertson, MD   

## 2015-06-18 ENCOUNTER — Other Ambulatory Visit: Payer: Self-pay | Admitting: Certified Nurse Midwife

## 2015-06-18 ENCOUNTER — Other Ambulatory Visit: Payer: Self-pay | Admitting: Pulmonary Disease

## 2015-06-18 ENCOUNTER — Telehealth: Payer: Self-pay | Admitting: Pulmonary Disease

## 2015-06-18 MED ORDER — MOMETASONE FUROATE 220 MCG/INH IN AEPB: INHALATION_SPRAY | RESPIRATORY_TRACT | Status: DC

## 2015-06-18 NOTE — Telephone Encounter (Signed)
Spoke with pt. She needs a refill on Asamanex. There is a recall placed for 11/2015. Rx has been sent in. Nothing further was needed.

## 2015-06-18 NOTE — Telephone Encounter (Signed)
Medication refill request: Valtrex Last AEX:  04-04-15 Next AEX: not yet scheduled Last MMG (if hormonal medication request): 07-16-14 WNL  Refill authorized: please advise

## 2015-07-01 ENCOUNTER — Other Ambulatory Visit: Payer: Self-pay | Admitting: Endocrinology

## 2015-07-01 DIAGNOSIS — Z853 Personal history of malignant neoplasm of breast: Secondary | ICD-10-CM

## 2015-07-30 ENCOUNTER — Ambulatory Visit
Admission: RE | Admit: 2015-07-30 | Discharge: 2015-07-30 | Disposition: A | Discharge status: Home / Self Care | Payer: 59 | Source: Ambulatory Visit | Attending: Endocrinology | Admitting: Endocrinology

## 2015-07-30 DIAGNOSIS — Z853 Personal history of malignant neoplasm of breast: Secondary | ICD-10-CM

## 2015-11-24 ENCOUNTER — Other Ambulatory Visit: Payer: Self-pay | Admitting: Certified Nurse Midwife

## 2015-11-25 NOTE — Telephone Encounter (Signed)
Patient is having pain with intercourse. She would like to come in to see DL.

## 2015-11-25 NOTE — Telephone Encounter (Signed)
Medication refill request: Valtrex   Last AEX:  04-04-15  Next AEX: not scheduled  Last MMG (if hormonal medication request): 07-30-15- recommended 6 month F/U DX Mammogram  Refill authorized: please advise

## 2015-11-25 NOTE — Telephone Encounter (Signed)
Left message to call Kaitlyn at [phone]. 

## 2015-11-27 ENCOUNTER — Encounter: Payer: Self-pay | Admitting: Certified Nurse Midwife

## 2015-11-27 ENCOUNTER — Ambulatory Visit (INDEPENDENT_AMBULATORY_CARE_PROVIDER_SITE_OTHER): Payer: 59 | Admitting: Certified Nurse Midwife

## 2015-11-27 VITALS — BP 120/74 | HR 64 | Resp 16 | Ht 62.25 in | Wt 127.0 lb

## 2015-11-27 DIAGNOSIS — N952 Postmenopausal atrophic vaginitis: Secondary | ICD-10-CM | POA: Diagnosis not present

## 2015-11-27 NOTE — Progress Notes (Signed)
70 y.o. Married Caucasian female G2P2 here with complaint of vaginal pain with sexual activity upon entrance in vagina and remains slightly uncomfortable during intercourse. Denies bleeding after intercourse. Uses Ky Jelly warming gel for lubrication. No. HSV 2 outbreaks in past year. Patient has not used coconut oil on regular basis, had good results with previous use. Also would like to have right breast check due to new calcifications noted on mammogram only at scar area. Has 6 month follow up scheduled. No other health issues today.   O:Healthy female WDWN Affect: normal, orientation x 3  Exam: Breast right only, patient declined left breast exam: no masses or tenderness noted. Scar tissue area noted at 12 o'clock known, no change in feel, no nipple discharge or skin change Abdomen:soft, non tender Lymph node: no enlargement or tenderness Pelvic exam: External genital: normal female atrophic appearance BUS: negative Vagina: scant moisture noted with friction redness noted at introitus only and slight increase pink further in introitus area. No odor.  Cervi and Uterus absent  Adnexa:normal, non tender, no masses or fullness noted   A:Normal pelvic exam Atrophic vaginitis Recent vaginal vault suspension at [facility] 6/17 with good results Right breast scar tissue with new calcifications on mammogram under follow up. No change in breast exam done today.   P:Discussed findings of atrophic vaginitis and etiology. Discussed findings of friction area to entrance to vagina. Discussed need for moisture barrier to protect skin with sexual activity. Patient has coconut oil and will resume use for this area and vaginal dryness again. No estrogen use due breast cancer and calcifications under evaluation. Will advise if no change.

## 2015-11-27 NOTE — Patient Instructions (Signed)
Atrophic Vaginitis Atrophic vaginitis is when the tissues that line the vagina become dry and thin. This is caused by a drop in estrogen. Estrogen helps:  To keep the vagina moist.  To make a clear fluid that helps:  To lubricate the vagina for sex.  To protect the vagina from infection. If the lining of the vagina is dry and thin, it may:  Make sex painful. It may also cause bleeding.  Cause a feeling of:  Burning.  Irritation.  Itchiness.  Make an exam of your vagina painful. It may also cause bleeding.  Make you lose interest in sex.  Cause a burning feeling when you pee.  Make your vaginal fluid (discharge) brown or yellow. For some women, there are no symptoms. This condition is most common in women who do not get their regular menstrual periods anymore (menopause). This often starts when a woman is 61-62 years old. HOME CARE  Take medicines only as told by your doctor. Do not use any herbal or alternative medicines unless your doctor says it is okay.  Use over-the-counter products for dryness only as told by your doctor. These include:  Creams.  Lubricants.  Moisturizers.  Do not douche.  Do not use products that can make your vagina dry. These include:  Scented feminine sprays.  Scented tampons.  Scented soaps.  If it hurts to have sex, tell your sexual partner. GET HELP IF:  Your discharge looks different than normal.  Your vagina has an unusual smell.  You have new symptoms.  Your symptoms do not get better with treatment.  Your symptoms get worse.   This information is not intended to replace advice given to you by your health care provider. Make sure you discuss any questions you have with your health care provider.   Document Released: 07/07/2007 Document Revised: 06/04/2014 Document Reviewed: 01/09/2014 Elsevier Interactive Patient Education [employer].

## 2015-11-28 NOTE — Progress Notes (Signed)
Encounter reviewed Jill Jertson, MD   

## 2015-12-30 ENCOUNTER — Other Ambulatory Visit: Payer: Self-pay | Admitting: Endocrinology

## 2015-12-30 DIAGNOSIS — Z853 Personal history of malignant neoplasm of breast: Secondary | ICD-10-CM

## 2016-01-05 NOTE — Telephone Encounter (Signed)
Patient was seen in the office on 11/27/2015 for evaluation with Melvia Heaps CNM.  Encounter previously closed.

## 2016-01-15 ENCOUNTER — Other Ambulatory Visit: Payer: Self-pay | Admitting: Endocrinology

## 2016-01-15 ENCOUNTER — Ambulatory Visit
Admission: RE | Admit: 2016-01-15 | Discharge: 2016-01-15 | Disposition: A | Discharge status: Home / Self Care | Payer: 59 | Source: Ambulatory Visit | Attending: Endocrinology | Admitting: Endocrinology

## 2016-01-15 DIAGNOSIS — Z853 Personal history of malignant neoplasm of breast: Secondary | ICD-10-CM

## 2016-03-28 ENCOUNTER — Other Ambulatory Visit: Payer: Self-pay | Admitting: Certified Nurse Midwife

## 2016-03-29 NOTE — Telephone Encounter (Signed)
Medication refill request: Valtrex Last AEX:  04-04-15 Next AEX: not scheduled Last OV: 11-27-15  Last MMG (if hormonal medication request):12-14-17Benign developing dystrophic calcifications associated with a mixed density oil cyst at the lumpectomy site in the upper inner right breast Annual bilateral diagnostic mammography which is due in 6 months Refill authorized: Please advise

## 2016-04-21 ENCOUNTER — Telehealth: Payer: Self-pay | Admitting: Pulmonary Disease

## 2016-04-21 MED ORDER — MOMETASONE FUROATE 220 MCG/INH IN AEPB: INHALATION_SPRAY | RESPIRATORY_TRACT | 0 refills | Status: DC

## 2016-04-21 NOTE — Telephone Encounter (Signed)
Spoke with the pt  She is requesting a refill on asmanex  I have advised she must keep appt in May for any future refills and she verbalized understanding  Rx was sent

## 2016-05-24 ENCOUNTER — Encounter: Payer: Self-pay | Admitting: Internal Medicine

## 2016-05-24 ENCOUNTER — Ambulatory Visit (INDEPENDENT_AMBULATORY_CARE_PROVIDER_SITE_OTHER): Payer: 59 | Admitting: Internal Medicine

## 2016-05-24 VITALS — BP 136/78 | HR 87 | Temp 97.8°F | Ht 62.5 in | Wt 127.6 lb

## 2016-05-24 DIAGNOSIS — J4520 Mild intermittent asthma, uncomplicated: Secondary | ICD-10-CM | POA: Insufficient documentation

## 2016-05-24 DIAGNOSIS — J069 Acute upper respiratory infection, unspecified: Secondary | ICD-10-CM | POA: Insufficient documentation

## 2016-05-24 MED ORDER — AZITHROMYCIN 250 MG PO TABS: ORAL_TABLET | ORAL | 0 refills | Status: DC

## 2016-05-24 NOTE — Progress Notes (Signed)
Subjective:     Patient ID: [name] [name], female   DOB: 04/21/1945, 71 y.o.   MRN: [id number]  HPI  71 yowf never smoker previously followed for asthma last seen by Dr Lake Bells 11/14/14 rec wean off asmanex unless needed seasonally.    05/24/2016 acute extended ov/Fredrico Beedle re: ? [name] s asthma flare so far Chief Complaint  Patient presents with  . Acute Visit    Pt c/o sore throat, chills and low grade temp x 3 days. She has also had some congestion and PND.   acutely ill since 05/22/16 but already chills/sore throat are gone, still stuffy in head and really not having chest symptoms yet but started asmanex just in case  And mostly concerned now about how to treat nasal symptoms/ only otc she's tried = baby clariton No recent abx or prednisone use No need for saba at all (proair respiclick) Thinks caught cold from grandchild   No obvious day to day or daytime variability or assoc excess/ purulent sputum or mucus plugs or hemoptysis or cp or chest tightness, subjective wheeze or overt  hb symptoms. No unusual exp hx or h/o childhood pna/ asthma or knowledge of premature birth.  Sleeping ok without nocturnal  or early am exacerbation  of respiratory  c/o's or need for noct saba. Also denies any obvious fluctuation of symptoms with weather or environmental changes or other aggravating or alleviating factors except as outlined above   Current Medications, Allergies, Complete Past Medical History, Past Surgical History, Family History, and Social History were reviewed in [employer] record.  ROS  The following are not active complaints unless bolded sore throat, dysphagia, dental problems, itching, sneezing,  nasal congestion or excess/ purulent secretions, ear ache,   fever, chills, sweats, unintended wt loss, classically pleuritic or exertional cp,  orthopnea pnd or leg swelling, presyncope, palpitations, abdominal pain, anorexia, nausea, vomiting, diarrhea  or change in  bowel or bladder habits, change in stools or urine, dysuria,hematuria,  rash, arthralgias, visual complaints, headache, numbness, weakness or ataxia or problems with walking or coordination,  change in mood/affect or memory.            Review of Systems     Objective:   Physical Exam    amb wf nad   Wt Readings from Last 3 Encounters:  05/24/16 127 lb 9.6 oz (57.9 kg)  11/27/15 127 lb (57.6 kg)  05/06/15 130 lb (59 kg)    Vital signs reviewed  - Note on arrival 02 sats  97% on RA     HEENT: nl dentition, turbinates bilaterally, and oropharynx which is pristine. Nl external ear canals without cough reflex   NECK :  without JVD/Nodes/TM/ nl carotid upstrokes bilaterally   LUNGS: no acc muscle use,  Nl contour chest which is clear to A and P bilaterally without cough on insp or exp maneuvers   CV:  RRR  no s3 or murmur or increase in P2, and no edema   ABD:  soft and nontender with nl inspiratory excursion in the supine position. No bruits or organomegaly appreciated, bowel sounds nl  MS:  Nl gait/ ext warm without deformities, calf tenderness, cyanosis or clubbing No obvious joint restrictions   SKIN: warm and dry without lesions    NEURO:  alert, approp, nl sensorium with  no motor or cerebellar deficits apparent.        Assessment:

## 2016-05-24 NOTE — Assessment & Plan Note (Signed)
She is already improving typical of a viral uri but has very poor insight into self rx and advised asthmanex while ok to use "seasonally" is simply too slow to work prn during an acute illness which is why she was provided with proair   I am concerned about using dpi's in pts with atypical asthma syndromes in general but her case seems so mild ok with me:   Use saba prn sob   Use advil cold and sinus prn nasal congestion and antihistamines for dripping/ sneezing and actually ok to use both simultaneously as long as follows the instructions on avs   If not improving might suggest a bacterial superinfection > zpak prn  I had an extended discussion with the patient reviewing all relevant studies completed to date and  lasting 15 to 20 minutes of a 25 minute acute visit with pt not previously known to me  Each maintenance medication was reviewed in detail including most importantly the difference between maintenance and prns and under what circumstances the prns are to be triggered using an action plan format that is not reflected in the computer generated alphabetically organized AVS.    Please see AVS for specific instructions unique to this visit that I personally wrote and verbalized to the the pt in detail and then reviewed with pt  by my nurse highlighting any  changes in therapy recommended at today's visit to their plan of care.

## 2016-05-24 NOTE — Assessment & Plan Note (Signed)
05/24/2016  After extensive coaching HFA effectiveness =    90% with DPI

## 2016-05-24 NOTE — Patient Instructions (Addendum)
Stop asthmanex unless you are needing the proair on a more regular basis than what you've been doing (more than twice weekly for more than a few weeks)    Only use your albuterol (proair respiclick)  as a rescue medication to be used if you can't catch your breath by resting or doing a relaxed purse lip breathing pattern.  - The less you use it, the better it will work when you need it. - Ok to use up to 2 puffs  every 4 hours if you must but call for immediate appointment if use goes up over your usual need - Don't leave home without it !!  (think of it like the spare tire for your car)   For head colds > advil cold and sinus   For itching sneezy runny nose ok to use clariton (ok to use both advil cold and sinus and clariton but not clariton D   If not improving after 5 days of illness, go ahead and use the Zpak    If you are satisfied with your treatment plan,  let your doctor know and he/she can either refill your medications or you can return here when your prescription runs out.     If in any way you are not 100% satisfied,  please tell us.  If 100% better, tell your friends!  Pulmonary follow up is as needed

## 2016-05-25 ENCOUNTER — Ambulatory Visit: Payer: 59 | Admitting: Certified Nurse Midwife

## 2016-06-02 ENCOUNTER — Encounter: Payer: Self-pay | Admitting: Certified Nurse Midwife

## 2016-06-02 ENCOUNTER — Ambulatory Visit (INDEPENDENT_AMBULATORY_CARE_PROVIDER_SITE_OTHER): Payer: 59 | Admitting: Certified Nurse Midwife

## 2016-06-02 VITALS — BP 112/70 | HR 68 | Resp 16 | Ht 61.75 in | Wt 125.0 lb

## 2016-06-02 DIAGNOSIS — Z86000 Personal history of in-situ neoplasm of breast: Secondary | ICD-10-CM | POA: Diagnosis not present

## 2016-06-02 DIAGNOSIS — N952 Postmenopausal atrophic vaginitis: Secondary | ICD-10-CM

## 2016-06-02 DIAGNOSIS — S82409A Unspecified fracture of shaft of unspecified fibula, initial encounter for closed fracture: Secondary | ICD-10-CM | POA: Insufficient documentation

## 2016-06-02 DIAGNOSIS — Z01419 Encounter for gynecological examination (general) (routine) without abnormal findings: Secondary | ICD-10-CM

## 2016-06-02 DIAGNOSIS — N6320 Unspecified lump in the left breast, unspecified quadrant: Secondary | ICD-10-CM | POA: Diagnosis not present

## 2016-06-02 NOTE — Progress Notes (Signed)
Patient scheduled while in office for diagnostic left breast MMG and ultrasound. Spoke with Roselyn Reef at [facility]. Patient declined first available appointment for 06/04/16 at 3:20pm. Patient scheduled for 06/08/16 arriving at 10:40am for 11am appointment. Patient verbalizes understanding and is agreeable.
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
## 2016-06-02 NOTE — Patient Instructions (Signed)

## 2016-06-03 ENCOUNTER — Other Ambulatory Visit: Payer: Self-pay | Admitting: Certified Nurse Midwife

## 2016-06-03 ENCOUNTER — Ambulatory Visit
Admission: RE | Admit: 2016-06-03 | Discharge: 2016-06-03 | Disposition: A | Discharge status: Home / Self Care | Payer: 59 | Source: Ambulatory Visit | Attending: Certified Nurse Midwife | Admitting: Certified Nurse Midwife

## 2016-06-03 DIAGNOSIS — N6320 Unspecified lump in the left breast, unspecified quadrant: Secondary | ICD-10-CM

## 2016-06-08 ENCOUNTER — Other Ambulatory Visit: Payer: 59

## 2016-06-09 ENCOUNTER — Ambulatory Visit
Admission: RE | Admit: 2016-06-09 | Discharge: 2016-06-09 | Disposition: A | Discharge status: Home / Self Care | Payer: 59 | Source: Ambulatory Visit | Attending: Certified Nurse Midwife | Admitting: Certified Nurse Midwife

## 2016-06-09 ENCOUNTER — Other Ambulatory Visit: Payer: 59

## 2016-06-09 ENCOUNTER — Ambulatory Visit: Payer: 59 | Admitting: Pulmonary Disease

## 2016-06-09 DIAGNOSIS — N6320 Unspecified lump in the left breast, unspecified quadrant: Secondary | ICD-10-CM

## 2016-06-10 ENCOUNTER — Telehealth: Payer: Self-pay | Admitting: Certified Nurse Midwife

## 2016-06-10 NOTE — Telephone Encounter (Signed)
Spoke with [name] [name] at the [facility] who states the patient's L breast biopsy results have returned and show L breast cancer. Per [name] [name] the patient would like to return to see [name] [name] with [name] as she was seen with him for care when he was diagnosed with R breast cancer in 2014. The [facility] is unable to refer outside of [facility] and will need our office to facilitate this appointment.  Spoke with [name] [name] who states the patient will only need a new referral if her insurance requires her to have a referral to be seen by a specialist. Patient has [name] [name]. [name] [name] is unable to tell me if her insurance will need referral.   Call to [name] [name] at the [facility] who has been unable to reach the patient. Has left a voicemail to discuss results. Will have the patient contact our office once she is given the results so that we are aware theses have been given and can help schedule her appointment with [name] [name] (per review with insurance dept no referral needed).

## 2016-06-10 NOTE — Telephone Encounter (Signed)
The [facility] is calling to speak with DL about the patient's biopsy results

## 2016-06-10 NOTE — Telephone Encounter (Signed)
Spoke with patient. Patient prefers to contact Dr.Georgiade to schedule her appointment around her schedule. Will call now. Patient will return call to the office to notify of appointment date and time so that I may facilitate any records being sent to [facility] as needed.

## 2016-06-16 ENCOUNTER — Encounter: Payer: Self-pay | Admitting: Certified Nurse Midwife

## 2016-06-16 NOTE — Telephone Encounter (Signed)
Left message to call Kaitlyn at [phone]. 

## 2016-06-16 NOTE — Telephone Encounter (Signed)
Spoke with patient. Patient is scheduled to see Dr. Stasia Cavalier today at [facility]. Has requested all notes and results be sent to Melvia Heaps CNM. States the [facility] sent all records to [facility] for her appointment.  Routing to provider for final review. Patient agreeable to disposition. Will close encounter.

## 2016-06-22 DIAGNOSIS — Z170 Estrogen receptor positive status [ER+]: Secondary | ICD-10-CM

## 2016-06-22 DIAGNOSIS — C50812 Malignant neoplasm of overlapping sites of left female breast: Secondary | ICD-10-CM | POA: Insufficient documentation

## 2016-08-18 ENCOUNTER — Other Ambulatory Visit: Payer: Self-pay | Admitting: Certified Nurse Midwife

## 2016-08-18 DIAGNOSIS — Z853 Personal history of malignant neoplasm of breast: Secondary | ICD-10-CM

## 2016-08-18 NOTE — Telephone Encounter (Signed)
Spoke with patient & she said she does need a refill of valtrex & we are the only place that has refilled it for her before. Pt states she hasn't had her mmg done on the other breast yet & will check with her drs at [place] to see if she needs to get that done because she is waiting to see if she needs radiation or not. Pt to call us back & let us know.

## 2016-08-18 NOTE — Telephone Encounter (Signed)
Return call to Joy. °

## 2016-08-18 NOTE — Telephone Encounter (Signed)
Please call and see if she needs refill of Valtrex, it was noted on her [facility] Rx also. Check on patient status since surgery for recurrent breast cancer.

## 2016-08-18 NOTE — Telephone Encounter (Signed)
Medication refill request: valtrex Last AEX:  06/02/16 DL Next AEX: none Last MMG (if hormonal medication request):  Refill authorized: 03/30/16 #[id number]. Today please advise.

## 2016-08-18 NOTE — Telephone Encounter (Signed)
Lmtcb. Also needs to inform patient that D Leonard,CNM received an order that says it needs to be signed for a mmg to be done but it looks like someone has been following her regarding her breast & that they needed to be the one to sign it if she even needed one done this soon after surgery.

## 2016-08-25 ENCOUNTER — Encounter: Payer: Self-pay | Admitting: Radiation Oncology

## 2016-08-26 NOTE — Progress Notes (Signed)
Location of Breast Cancer: Left Breast  Histology per Pathology Report:  06/09/16 Breast, left, needle core biopsy, upper, outer quadrant - INVASIVE MAMMARY CARCINOMA. - MAMMARY CARCINOMA IN SITU.  Receptor Status: ER(80%), PR (60%), Her2-neu (NEG), Ki-(15%)  07/12/16 A) - Lymph Node, Sentinel, left axilla sentinel node #1, hot (3568)  B) - Breast, Left, left breast wire localized partial mastectomy,wire enters   inferior at 0600, black suture is medial at 0900, white suture is lateral at 0300   Diagnosis:  A. Lymph node, left axillary sentinel, excision:  Two benign lymph nodes (0/2).   B.  Breast, left (excision):  Invasive adenocarcinoma with mixed lobular and ductal features (E-Cadherin pending), Nottingham grade 2, is identified forming a 19 mm mass, atypical ductal hyperplasia, and biopsy site changes.   Surgical resection margins are negative for invasive carcinoma, with closest approach of tumor 2.2 mm to the inferior margin.      Did patient present with symptoms or was this found on screening mammography?: It was found on a yearly clinical exam by her GYN PA.  Past/Anticipated interventions by surgeon, if any: 07/12/16 Dr. Stasia Cavalier, Belenda Cruise  MASTECTOMY, PARTIAL (EG, LUMPECTOMY, TYLECTOMY, QUADRANTECTOMY, SEGMENTECTOMY)  EXCISION OF BREAST LESION IDENTIFIED BY PREOPERATIVE PLACEMENT OF RADIOLOGICAL MARKER, OPEN; SINGLE LESION  Past/Anticipated interventions by medical oncology, if any:  Dr. Valaria Good ([facility] documents 08/17/16: -OncotypeDX results are available. Recurrence Score is 21, which is in the intermediate risk range.  -Attempt was made to reach the patient by telephone with the results. She did not answer. Message was left on her confidential voicemail. Result was also shared via response to her MyChart message on 08/18/16.  -According to the [facility] results, the additional  benefit of adjuvant chemotherapy in postmenopausal women with OncotypeDX test results in the intermediate risk range is minimal / not worth pursuing. She will proceed to RT and return to see [place] to discuss adjuvant endocrine therapy.    Lymphedema issues, if any:  She denies. She has good arm mobility.   Pain issues, if any:  She denies.   SAFETY ISSUES: Prior radiation? TREATMENT DATES: Yes 10/11/2012 through 11/13/2012                                                     SITE/DOSE:  Right breast 4800 cGy in 24 sessions by Dr. Valere Dross  Pacemaker/ICD? No  Possible current pregnancy? No  Is the patient on methotrexate? No  Current Complaints / other details:    BP (!) 160/70   Pulse (!) 54   Temp 98.4 F (36.9 C)   Ht 5' 1.75" (1.568 m)   Wt 125 lb 9.6 oz (57 kg)   LMP 01/02/2000   SpO2 99% Comment: room air  BMI 23.16 kg/m    Wt Readings from Last 3 Encounters:  09/01/16 125 lb 9.6 oz (57 kg)  06/02/16 125 lb (56.7 kg)  05/24/16 127 lb 9.6 oz (57.9 kg)      Jayzen Paver, Stephani Police, RN 08/26/2016,2:55 PM

## 2016-09-01 ENCOUNTER — Encounter: Payer: Self-pay | Admitting: Radiation Oncology

## 2016-09-01 ENCOUNTER — Ambulatory Visit
Admission: RE | Admit: 2016-09-01 | Discharge: 2016-09-01 | Disposition: A | Discharge status: Home / Self Care | Payer: PRIVATE HEALTH INSURANCE | Source: Ambulatory Visit | Attending: Radiation Oncology | Admitting: Radiation Oncology

## 2016-09-01 ENCOUNTER — Ambulatory Visit
Admission: RE | Admit: 2016-09-01 | Discharge: 2016-09-01 | Disposition: A | Discharge status: Home / Self Care | Payer: 59 | Source: Ambulatory Visit | Attending: Radiation Oncology | Admitting: Radiation Oncology

## 2016-09-01 DIAGNOSIS — Z90710 Acquired absence of both cervix and uterus: Secondary | ICD-10-CM | POA: Diagnosis not present

## 2016-09-01 DIAGNOSIS — Z8781 Personal history of (healed) traumatic fracture: Secondary | ICD-10-CM | POA: Diagnosis not present

## 2016-09-01 DIAGNOSIS — Z8249 Family history of ischemic heart disease and other diseases of the circulatory system: Secondary | ICD-10-CM | POA: Diagnosis not present

## 2016-09-01 DIAGNOSIS — Z880 Allergy status to penicillin: Secondary | ICD-10-CM | POA: Insufficient documentation

## 2016-09-01 DIAGNOSIS — D0511 Intraductal carcinoma in situ of right breast: Secondary | ICD-10-CM

## 2016-09-01 DIAGNOSIS — C50412 Malignant neoplasm of upper-outer quadrant of left female breast: Secondary | ICD-10-CM | POA: Insufficient documentation

## 2016-09-01 DIAGNOSIS — Z79899 Other long term (current) drug therapy: Secondary | ICD-10-CM | POA: Diagnosis not present

## 2016-09-01 DIAGNOSIS — E890 Postprocedural hypothyroidism: Secondary | ICD-10-CM | POA: Insufficient documentation

## 2016-09-01 DIAGNOSIS — Z888 Allergy status to other drugs, medicaments and biological substances status: Secondary | ICD-10-CM | POA: Diagnosis not present

## 2016-09-01 DIAGNOSIS — Z7951 Long term (current) use of inhaled steroids: Secondary | ICD-10-CM | POA: Insufficient documentation

## 2016-09-01 DIAGNOSIS — J4520 Mild intermittent asthma, uncomplicated: Secondary | ICD-10-CM | POA: Insufficient documentation

## 2016-09-01 DIAGNOSIS — Z887 Allergy status to serum and vaccine status: Secondary | ICD-10-CM | POA: Insufficient documentation

## 2016-09-01 DIAGNOSIS — Z170 Estrogen receptor positive status [ER+]: Secondary | ICD-10-CM | POA: Insufficient documentation

## 2016-09-01 DIAGNOSIS — I10 Essential (primary) hypertension: Secondary | ICD-10-CM | POA: Insufficient documentation

## 2016-09-01 DIAGNOSIS — Z510 Encounter for antineoplastic radiation therapy: Secondary | ICD-10-CM | POA: Diagnosis present

## 2016-09-01 DIAGNOSIS — Z86000 Personal history of in-situ neoplasm of breast: Secondary | ICD-10-CM | POA: Diagnosis not present

## 2016-09-01 DIAGNOSIS — Z7982 Long term (current) use of aspirin: Secondary | ICD-10-CM | POA: Insufficient documentation

## 2016-09-01 DIAGNOSIS — M8580 Other specified disorders of bone density and structure, unspecified site: Secondary | ICD-10-CM | POA: Insufficient documentation

## 2016-09-01 DIAGNOSIS — Z923 Personal history of irradiation: Secondary | ICD-10-CM | POA: Diagnosis not present

## 2016-09-01 NOTE — Progress Notes (Addendum)
Radiation Oncology         [phone] ________________________________  Initial outpatient Consultation  Name: [name] [name] MRN: [id number]  Date: 09/01/2016  DOB: 22-May-1945  PN:TIRWE, Annie Main, MD  Bonner Puna, MD   REFERRING PHYSICIAN: Bonner Puna, MD  DIAGNOSIS:    ICD-10-CM   1. Ductal carcinoma in situ (DCIS) of right breast D05.11   2. Carcinoma of upper-outer quadrant of left breast in female, estrogen receptor positive ([place]) C50.412    Z17.0    Stage IA pT1c pN0 Left Breast UOQ Invasive Ductal Carcinoma, ER 80% / PR 8% / Her2 Negative, Grade II  CHIEF COMPLAINT: Here to discuss management of left breast cancer  HISTORY OF PRESENT ILLNESS::[name] [name] is a 71 y.o. female who has a prior history of Stage 0 (Tis N0 M0) right breast DCIS, treated with lumpectomy and radiation therapy in 2014.  The patient recently presented for screening mammography, which revealed an abnormality in the left breast. On 06/03/16, the patient underwent left breast diagnostic mammography, which revealed indeterminate left breast finding at the 3 o'clock region, 5 cm from the nipple. [place] on the same day confirmed this finding. The patient proceeded with left breast biopsy on 06/09/16, which showed invasive mammary carcinoma with mammary carcinoma in situ, likely grade II.   On 07/12/16 the patient underwent left breast lumpectomy with Dr. Stasia Cavalier at [facility]. Surgical pathology revealed invasive adenocarcinoma with mixed lobular and ductal features, grade II, spanning 19 mm. Margins negative for invasive carcinoma. Of 2 lymph nodes sampled, both were benign.   The patient met in consultation with Dr. Valaria Good at the [facility] on 08/17/16. At that time, the following was documented:  "- Oncotype Dx results are available. Recurrence score is 21, which is in the intermediate risk range. - According to the [facility] results, the additional benefit of adjuvant chemotherapy in  postmenopausal women with Oncotype Dx test results in the intermediate risk range is minimal/not worth pursuing. She will proceed with RT and return to see [place] to discuss adjuvant endocrine therapy."  The patient presents to the clinic today to discuss the role that radiation therapy may play in the treatment of her disease. The patient is accompanied by her husband today.  On review of systems, the patient denies lymphedema concerns and demonstrates good arm mobility. She denies pain at this time. Overall, she is without complaint. The patient has previously used estrogen supplement creams; she stopped using these in May 2018. She had a routine mammogram less than one year ago in December 2017, and questions if she should proceed with a scheduled routine mammogram this month.   The patient reports she was told she would begin Arimidex 2 weeks post radiotherapy. She did not take antiestrogen therapy previously.   PREVIOUS RADIATION THERAPY: Yes 10/11/12 - 11/13/12 : Right Breast treated to 4800 cGy in 24 fractions by Dr. Valere Dross  PAST MEDICAL HISTORY:  has a past medical history of Acute asthmatic bronchitis; Asthma, mild intermittent; Breast cancer ([place]) (08/2012); Cystocele; Diverticulosis (2004); Fibroids (1998); Fracture of fibula (2007); Heart murmur; History of TMJ disorder; radiation therapy (10/11/12- 11/13/12); Hypertension; Hypothyroidism; Osteopenia (2002); Raynaud's disease; Shingles; and Vasomotor rhinitis.    PAST SURGICAL HISTORY: Past Surgical History:  Procedure Laterality Date  . ABDOMINAL HYSTERECTOMY    . BREAST BIOPSY Left 2015   MRI- Benign  . BREAST BIOPSY Right 07/05/2012   Stereo- Malignant  . BREAST EXCISIONAL BIOPSY Left   . BREAST EXCISIONAL BIOPSY Left   .  BREAST LUMPECTOMY Right 08/10/12   [facility], NO RESIDUAL DISEASE  . HYSTEROSCOPY WITH RESECTOSCOPE  2002   submucous fibroid  . INCONTINENCE SURGERY    . MINOR BREAST BIOPSY Left    times two  . ROTATOR CUFF REPAIR  Right 2010  . THYROIDECTOMY      FAMILY HISTORY: family history includes Aneurysm in her mother; Cancer in her maternal grandfather and sister; Clotting disorder in her maternal grandfather; Hypertension in her sister.  SOCIAL HISTORY:  reports that she has never smoked. She has never used smokeless tobacco. She reports that she drinks about 3.6 oz of alcohol per week . She reports that she does not use drugs.   ALLERGIES: Pneumovax 23  [pneumococcal vac polyvalent]; Effexor [venlafaxine]; Penicillins; and Pneumovax [pneumococcal polysaccharide vaccine]  MEDICATIONS:  Current Outpatient Prescriptions  Medication Sig Dispense Refill  . fluticasone (FLONASE) 50 MCG/ACT nasal spray Place 2 sprays into both nostrils daily.    Marland Kitchen levothyroxine (SYNTHROID, LEVOTHROID) 75 MCG tablet Take 75 mcg by mouth. 6 days a week take 1 pill & 1 day a week take 1/2 a pill    . loratadine (CLARITIN CHILDRENS) 5 MG chewable tablet Chew 5 mg by mouth daily. Uses as needed    . Probiotic Product (ACIDOPHILUS/GOAT MILK) CAPS Take by mouth.    . zolpidem (AMBIEN) 5 MG tablet Take 5 mg by mouth at bedtime as needed for sleep.    . Albuterol Sulfate (PROAIR RESPICLICK) 701 (90 BASE) MCG/ACT AEPB Inhale 2 puffs into the lungs every 6 (six) hours as needed. (Patient not taking: Reported on 09/01/2016) 1 each 0  . aspirin EC 81 MG tablet Take by mouth 2 (two) times a week.    . calcium-vitamin D (OSCAL WITH D) 500-200 MG-UNIT per tablet Take 1 tablet by mouth daily.    . cholecalciferol (VITAMIN D) 1000 UNITS tablet Take 1,000 Units by mouth 2 (two) times a week.    Marland Kitchen UNABLE TO FIND Testosterone/progesterone cream 1/2 ML apply measured dose to neck morning and night    . UNABLE TO FIND Biest(20/80) 2.75m/ml cream /estradiol Apply 1/2 ml  Twice daily    . valACYclovir (VALTREX) 500 MG tablet TAKE 1 TABLET BY MOUTH ONCE DAILY, INCREASE TO TWICE DAILY AT ONSET OF OUTBREAK. (Patient not taking: Reported on 09/01/2016) 45 tablet  11   No current facility-administered medications for this encounter.     REVIEW OF SYSTEMS: as above  PHYSICAL EXAM:  height is 5' 1.75" (1.568 m) and weight is 125 lb 9.6 oz (57 kg). Her temperature is 98.4 F (36.9 C). Her blood pressure is 160/70 (abnormal) and her pulse is 54 (abnormal). Her oxygen saturation is 99%.   General: Alert and oriented, in no acute distress. HEENT: Head is normocephalic. Oropharynx is clear. No lesions in the throat. Neck: Neck is supple, no palpable cervical or supraclavicular lymphadenopathy, or masses. Heart: Regular in rate and rhythm. She has a murmur loudest at the aortic region. Chest: Clear to auscultation bilaterally. Abdomen: Soft, non tender, non distended. Extremities: No edema in extremities. Lymphatics: see Neck Exam Skin: No concerning lesions. Neurologic: No obvious focalities. Speech is fluent. Coordination is intact. Psychiatric: Judgment and insight are intact. Affect is appropriate. Breasts: In the UIQ of the right breast she has some scar tissue about 1.5 cm in greatest dimension under lumpectomy scar. Right axilla is clear. In the left breast she is healing very well, with a lateral lumpectomy scar around the 2:30 o'clock position and  an additional axillary scar. Underneath the left lumpectomy scar she has palpable scar tissue measuring approximately 2.5 cm.  ECOG = 0    LABORATORY DATA:  Lab Results  Component Value Date   WBC 5.1 01/11/2013   HGB 12.6 01/11/2013   HCT 39.6 01/11/2013   MCV 93.3 01/11/2013   PLT 233 01/11/2013   CMP     Component Value Date/Time   NA 143 01/11/2013 1617   K 3.8 01/11/2013 1617   CL 102 03/11/2008 1418   CO2 27 01/11/2013 1617   GLUCOSE 85 01/11/2013 1617   BUN 13.9 01/11/2013 1617   CREATININE 0.7 01/11/2013 1617   CALCIUM 9.6 01/11/2013 1617   PROT 8.0 01/11/2013 1617   ALBUMIN 3.8 01/11/2013 1617   AST 30 01/11/2013 1617   ALT 34 01/11/2013 1617   ALKPHOS 108 01/11/2013 1617     BILITOT 0.28 01/11/2013 1617   GFRNONAA >60 03/11/2008 1418   GFRAA  03/11/2008 1418    >60        The eGFR has been calculated using the MDRD equation. This calculation has not been validated in all clinical situations. eGFR's persistently <60 mL/min signify possible Chronic Kidney Disease.      RADIOGRAPHY: as above   IMPRESSION/PLAN: Left breast cancer  For the patient's early stage favorable risk breast cancer, we had a thorough discussion about her options for adjuvant therapy. One option would be antiestrogen therapy as discussed with medical oncology. She would take a pill for approximately 5 years. She can additionally pursue radiotherapy to the breast.   Of note, I discussed the data from the [facility] al trial in the [place] of [facility]. She understands that tamoxifen compared to radiation plus tamoxifen demonstrated no survival benefit among the women in this study. The women were 16 years or older with stage I estrogen receptor positive breast cancer. Based on this study, I told the patient that her overall life expectancy should not be affected by adding radiotherapy to antiestrogen medication. She understands that the main benefit of  adding radiotherapy to anti estrogen therapy would be a very small but measurable local control benefit (risk of local recurrence to be lowered from ~10% --> ~2% over a decade).  We discussed the fact that radiotherapy only provides a local control benefit while anti-estrogen pills provide systemic coverage. That being said, the risk of systemic failure is relatively low with her type of breast cancer.  It was a pleasure meeting the patient today. We discussed the risks, benefits, and side effects of radiotherapy. I recommend radiotherapy to the left breast to reduce her risk of locoregional recurrence by 2/3.  We discussed that radiation would take approximately 3 weeks to complete. We spoke about acute effects including skin  irritation and fatigue as well as much less common late effects including internal organ injury or irritation. We spoke about the latest technology that is used to minimize the risk of late effects for patients undergoing radiotherapy to the breast or chest wall. No guarantees of treatment were given. The patient is enthusiastic about proceeding with treatment. I look forward to participating in the patient's care.    We specifically discussed the rationale for either 16 or 20 radiation treatments. I discussed the purpose of Boost treatments, and answered the patient's questions about this treatment consideration. She has declined the Boost treatment at this time.  Patient is vacationing from 09/04/16 - 09/11/16. She is interested in beginning treatment as soon as possible  on her return. She is scheduled for CT Simulation and treatment planning on Friday, 09/03/16 at 9 am. Tentatively planned to begin treatment during the week of 09/13/16.  Additionally today I discussed the role of PT in alleviating any sexual discomfort that may arise from discontinuing estrogen supplement creams and beginning antiestrogen therapy. I encouraged her to pursue these services in the future as needed. I will be happy to refer her to these services, or to defer to the care team at [facility], as indicated by the patient.  I advised the patient to call the [facility] to discuss the timeline of her recent and upcoming mammograms. She may be able to delay until December 2018 in order to return to a regular schedule of routine annual bilateral mammograms. She will confirm this plan with her radiologist and follow with their recommendations.  I provided comfort and support for the patient and her husband during this encounter. I encouraged her about her experience so far, and her likelihood of continued quality of life with the therapies proposed.      Eppie Gibson, MD  This document serves as a record of services personally  performed by Eppie Gibson, MD. It was created on her behalf by Maryla Morrow, a trained medical scribe. The creation of this record is based on the scribe's personal observations and the provider's statements to them. This document has been checked and approved by the attending provider.

## 2016-09-03 ENCOUNTER — Ambulatory Visit
Admission: RE | Admit: 2016-09-03 | Discharge: 2016-09-03 | Disposition: A | Discharge status: Home / Self Care | Payer: 59 | Source: Ambulatory Visit | Attending: Radiation Oncology | Admitting: Radiation Oncology

## 2016-09-03 DIAGNOSIS — Z170 Estrogen receptor positive status [ER+]: Secondary | ICD-10-CM

## 2016-09-03 DIAGNOSIS — C50412 Malignant neoplasm of upper-outer quadrant of left female breast: Secondary | ICD-10-CM

## 2016-09-03 DIAGNOSIS — Z510 Encounter for antineoplastic radiation therapy: Secondary | ICD-10-CM | POA: Diagnosis not present

## 2016-09-03 NOTE — Progress Notes (Addendum)
Radiation Oncology         [phone] ________________________________  Name: [name] [name] MRN: [id number]  Date: 09/03/2016  DOB: 1945/08/20  SIMULATION AND TREATMENT PLANNING NOTE / Special treatment procedure   Outpatient  DIAGNOSIS:     ICD-10-CM   1. Carcinoma of upper-outer quadrant of left breast in female, estrogen receptor positive ([place]) C50.412    Z17.0     NARRATIVE:  The patient was brought to the [facility].  Identity was confirmed.  All relevant records and images related to the planned course of therapy were reviewed.  The patient freely provided informed written consent to proceed with treatment after reviewing the details related to the planned course of therapy. The consent form was witnessed and verified by the simulation staff.    Then, the patient was set-up in a stable reproducible supine position for radiation therapy with her ipsilateral arm over her head, and her upper body secured in a custom-made Vac-lok device.  CT images were obtained.  Surface markings were placed.  The CT images were loaded into the planning software.    Special treatment procedure:  Special treatment procedure was performed today due to the extra time and effort required by myself to plan and prepare this patient for deep inspiration breath hold technique.  I have determined cardiac sparing to be of benefit to this patient to prevent long term cardiac damage due to radiation of the heart.  Bellows were placed on the patient's abdomen. To facilitate cardiac sparing, the patient was coached by the radiation therapists on breath hold techniques and breathing practice was performed. Practice waveforms were obtained. The patient was then scanned while maintaining breath hold in the treatment position.  This image was then transferred over to the imaging specialist. The imaging specialist then created a fusion of the free breathing and breath hold scans using the chest wall as  the stable structure. I personally reviewed the fusion in axial, coronal and sagittal image planes.  Excellent cardiac sparing was obtained.  I felt the patient is an appropriate candidate for breath hold and the patient will be treated as such.  The image fusion was then reviewed with the patient to reinforce the necessity of reproducible breath hold.   TREATMENT PLANNING NOTE: Treatment planning then occurred.  The radiation prescription was entered and confirmed.     A total of 3 medically necessary complex treatment devices were fabricated and supervised by me: 2 fields with MLCs for custom blocks to protect heart, and lungs;  and, a Vac-lok. MORE COMPLEX DEVICES MAY BE MADE IN DOSIMETRY FOR FIELD IN FIELD BEAMS FOR DOSE HOMOGENEITY.  I have requested : 3D Simulation which is medically necessary to give adequate dose to at risk tissues while sparing lungs and heart.  I have requested a DVH of the following structures: lungs, heart, lumpectomy cavity.    The patient will receive 42.56 Gy in 16 fractions to the left breast with 2 tangential fields.  This will not be followed by a boost.  Optical Surface Tracking Plan:  Since intensity modulated radiotherapy (IMRT) and 3D conformal radiation treatment methods are predicated on accurate and precise positioning for treatment, intrafraction motion monitoring is medically necessary to ensure accurate and safe treatment delivery. The ability to quantify intrafraction motion without excessive ionizing radiation dose can only be performed with optical surface tracking. Accordingly, surface imaging offers the opportunity to obtain 3D measurements of patient position throughout IMRT and 3D treatments without excessive  radiation exposure. I am ordering optical surface tracking for this patient's upcoming course of radiotherapy.   _______________________________   Reference:  Ursula Alert, J, et al. Surface imaging-based analysis of  intrafraction motion for breast radiotherapy patients.Journal of [place], n. 6, nov. 2014. ISSN [id number].  Available at: <http://www.jacmp.org/index.php/jacmp/article/view/4957>.   Special Treatment Procedure Note: The patient received prior radiotherapy close to her current fields. There could be some overlap of radiation dose.  Prior regional radiotherapy increases the risk of side effects from treatment. I have considered this in the treatment planning process and have aimed to minimize tissue overlap.  This increases the complexity of this patient's treatment and therefore this constitutes a special treatment procedure.  -----------------------------------  Eppie Gibson, MD

## 2016-09-08 DIAGNOSIS — Z510 Encounter for antineoplastic radiation therapy: Secondary | ICD-10-CM | POA: Diagnosis not present

## 2016-09-14 ENCOUNTER — Ambulatory Visit: Payer: PRIVATE HEALTH INSURANCE | Admitting: Radiation Oncology

## 2016-09-15 ENCOUNTER — Ambulatory Visit
Admission: RE | Admit: 2016-09-15 | Discharge: 2016-09-15 | Disposition: A | Discharge status: Home / Self Care | Payer: 59 | Source: Ambulatory Visit | Attending: Radiation Oncology | Admitting: Radiation Oncology

## 2016-09-15 DIAGNOSIS — Z510 Encounter for antineoplastic radiation therapy: Secondary | ICD-10-CM | POA: Diagnosis not present

## 2016-09-16 ENCOUNTER — Ambulatory Visit
Admission: RE | Admit: 2016-09-16 | Discharge: 2016-09-16 | Disposition: A | Discharge status: Home / Self Care | Payer: 59 | Source: Ambulatory Visit | Attending: Radiation Oncology | Admitting: Radiation Oncology

## 2016-09-16 DIAGNOSIS — C50412 Malignant neoplasm of upper-outer quadrant of left female breast: Secondary | ICD-10-CM

## 2016-09-16 DIAGNOSIS — Z170 Estrogen receptor positive status [ER+]: Secondary | ICD-10-CM

## 2016-09-16 DIAGNOSIS — Z510 Encounter for antineoplastic radiation therapy: Secondary | ICD-10-CM | POA: Diagnosis not present

## 2016-09-16 MED ORDER — ALRA NON-METALLIC DEODORANT (RAD-ONC)
1.0000 "application " | Freq: Once | TOPICAL | Status: AC
  Administered 2016-09-16: 1 via TOPICAL

## 2016-09-16 MED ORDER — RADIAPLEXRX EX GEL
Freq: Once | CUTANEOUS | Status: AC
  Administered 2016-09-16: 15:00:00 via TOPICAL

## 2016-09-16 NOTE — Progress Notes (Signed)
Pt here for patient teaching.    Pt given Radiation and You booklet, skin care instructions, Alra deodorant and Radiaplex gel.    Reviewed areas of pertinence such as fatigue, hair loss, skin changes, breast tenderness and breast swelling .   Pt able to give teach back of to pat skin and use unscented/gentle soap,apply Radiaplex bid, avoid applying anything to skin within 4 hours of treatment, avoid wearing an under wire bra and to use an electric razor if they must shave.   Pt demonstrated understanding and verbalizes understanding of information given and will contact nursing with any questions or concerns.

## 2016-09-17 ENCOUNTER — Ambulatory Visit
Admission: RE | Admit: 2016-09-17 | Discharge: 2016-09-17 | Disposition: A | Discharge status: Home / Self Care | Payer: 59 | Source: Ambulatory Visit | Attending: Radiation Oncology | Admitting: Radiation Oncology

## 2016-09-17 DIAGNOSIS — Z510 Encounter for antineoplastic radiation therapy: Secondary | ICD-10-CM | POA: Diagnosis not present

## 2016-09-20 ENCOUNTER — Ambulatory Visit
Admission: RE | Admit: 2016-09-20 | Discharge: 2016-09-20 | Disposition: A | Discharge status: Home / Self Care | Payer: 59 | Source: Ambulatory Visit | Attending: Radiation Oncology | Admitting: Radiation Oncology

## 2016-09-20 ENCOUNTER — Ambulatory Visit: Admission: RE | Admit: 2016-09-20 | Payer: 59 | Source: Ambulatory Visit

## 2016-09-20 DIAGNOSIS — Z510 Encounter for antineoplastic radiation therapy: Secondary | ICD-10-CM | POA: Diagnosis not present

## 2016-09-21 ENCOUNTER — Ambulatory Visit
Admission: RE | Admit: 2016-09-21 | Discharge: 2016-09-21 | Disposition: A | Discharge status: Home / Self Care | Payer: 59 | Source: Ambulatory Visit | Attending: Radiation Oncology | Admitting: Radiation Oncology

## 2016-09-21 DIAGNOSIS — Z510 Encounter for antineoplastic radiation therapy: Secondary | ICD-10-CM | POA: Diagnosis not present

## 2016-09-22 ENCOUNTER — Ambulatory Visit
Admission: RE | Admit: 2016-09-22 | Discharge: 2016-09-22 | Disposition: A | Discharge status: Home / Self Care | Payer: 59 | Source: Ambulatory Visit | Attending: Radiation Oncology | Admitting: Radiation Oncology

## 2016-09-22 DIAGNOSIS — Z510 Encounter for antineoplastic radiation therapy: Secondary | ICD-10-CM | POA: Diagnosis not present

## 2016-09-23 ENCOUNTER — Ambulatory Visit
Admission: RE | Admit: 2016-09-23 | Discharge: 2016-09-23 | Disposition: A | Discharge status: Home / Self Care | Payer: 59 | Source: Ambulatory Visit | Attending: Radiation Oncology | Admitting: Radiation Oncology

## 2016-09-23 DIAGNOSIS — Z510 Encounter for antineoplastic radiation therapy: Secondary | ICD-10-CM | POA: Diagnosis not present

## 2016-09-24 ENCOUNTER — Ambulatory Visit
Admission: RE | Admit: 2016-09-24 | Discharge: 2016-09-24 | Disposition: A | Discharge status: Home / Self Care | Payer: 59 | Source: Ambulatory Visit | Attending: Radiation Oncology | Admitting: Radiation Oncology

## 2016-09-24 DIAGNOSIS — Z510 Encounter for antineoplastic radiation therapy: Secondary | ICD-10-CM | POA: Diagnosis not present

## 2016-09-27 ENCOUNTER — Other Ambulatory Visit: Payer: Self-pay | Admitting: Radiation Oncology

## 2016-09-27 ENCOUNTER — Ambulatory Visit
Admission: RE | Admit: 2016-09-27 | Discharge: 2016-09-27 | Disposition: A | Discharge status: Home / Self Care | Payer: 59 | Source: Ambulatory Visit | Attending: Radiation Oncology | Admitting: Radiation Oncology

## 2016-09-27 DIAGNOSIS — Z170 Estrogen receptor positive status [ER+]: Secondary | ICD-10-CM

## 2016-09-27 DIAGNOSIS — C50412 Malignant neoplasm of upper-outer quadrant of left female breast: Secondary | ICD-10-CM

## 2016-09-27 DIAGNOSIS — Z510 Encounter for antineoplastic radiation therapy: Secondary | ICD-10-CM | POA: Diagnosis not present

## 2016-09-28 ENCOUNTER — Telehealth: Payer: Self-pay | Admitting: *Deleted

## 2016-09-28 ENCOUNTER — Ambulatory Visit
Admission: RE | Admit: 2016-09-28 | Discharge: 2016-09-28 | Disposition: A | Discharge status: Home / Self Care | Payer: 59 | Source: Ambulatory Visit | Attending: Radiation Oncology | Admitting: Radiation Oncology

## 2016-09-28 DIAGNOSIS — Z510 Encounter for antineoplastic radiation therapy: Secondary | ICD-10-CM | POA: Diagnosis not present

## 2016-09-28 NOTE — Telephone Encounter (Signed)
CALLED PATIENT TO INFORM OF APPT. WITH DR. MAGRINAT ON 10-11-16 - ARRIVAL TIME - 3:45 PM, LVM FOR A RETURN CALL

## 2016-09-29 ENCOUNTER — Ambulatory Visit
Admission: RE | Admit: 2016-09-29 | Discharge: 2016-09-29 | Disposition: A | Discharge status: Home / Self Care | Payer: 59 | Source: Ambulatory Visit | Attending: Radiation Oncology | Admitting: Radiation Oncology

## 2016-09-29 DIAGNOSIS — Z510 Encounter for antineoplastic radiation therapy: Secondary | ICD-10-CM | POA: Diagnosis not present

## 2016-09-30 ENCOUNTER — Ambulatory Visit
Admission: RE | Admit: 2016-09-30 | Discharge: 2016-09-30 | Disposition: A | Discharge status: Home / Self Care | Payer: 59 | Source: Ambulatory Visit | Attending: Radiation Oncology | Admitting: Radiation Oncology

## 2016-09-30 DIAGNOSIS — C50412 Malignant neoplasm of upper-outer quadrant of left female breast: Secondary | ICD-10-CM

## 2016-09-30 DIAGNOSIS — Z510 Encounter for antineoplastic radiation therapy: Secondary | ICD-10-CM | POA: Diagnosis not present

## 2016-09-30 DIAGNOSIS — Z170 Estrogen receptor positive status [ER+]: Secondary | ICD-10-CM

## 2016-09-30 MED ORDER — RADIAPLEXRX EX GEL
Freq: Once | CUTANEOUS | Status: AC
  Administered 2016-09-30: 15:00:00 via TOPICAL

## 2016-10-01 ENCOUNTER — Ambulatory Visit
Admission: RE | Admit: 2016-10-01 | Discharge: 2016-10-01 | Disposition: A | Discharge status: Home / Self Care | Payer: 59 | Source: Ambulatory Visit | Attending: Radiation Oncology | Admitting: Radiation Oncology

## 2016-10-01 DIAGNOSIS — Z510 Encounter for antineoplastic radiation therapy: Secondary | ICD-10-CM | POA: Diagnosis not present

## 2016-10-05 ENCOUNTER — Ambulatory Visit
Admission: RE | Admit: 2016-10-05 | Discharge: 2016-10-05 | Disposition: A | Discharge status: Home / Self Care | Payer: 59 | Source: Ambulatory Visit | Attending: Radiation Oncology | Admitting: Radiation Oncology

## 2016-10-05 DIAGNOSIS — Z510 Encounter for antineoplastic radiation therapy: Secondary | ICD-10-CM | POA: Diagnosis not present

## 2016-10-06 ENCOUNTER — Ambulatory Visit
Admission: RE | Admit: 2016-10-06 | Discharge: 2016-10-06 | Disposition: A | Discharge status: Home / Self Care | Payer: 59 | Source: Ambulatory Visit | Attending: Radiation Oncology | Admitting: Radiation Oncology

## 2016-10-06 DIAGNOSIS — Z510 Encounter for antineoplastic radiation therapy: Secondary | ICD-10-CM | POA: Diagnosis not present

## 2016-10-07 ENCOUNTER — Ambulatory Visit
Admission: RE | Admit: 2016-10-07 | Discharge: 2016-10-07 | Disposition: A | Discharge status: Home / Self Care | Payer: 59 | Source: Ambulatory Visit | Attending: Radiation Oncology | Admitting: Radiation Oncology

## 2016-10-07 DIAGNOSIS — Z510 Encounter for antineoplastic radiation therapy: Secondary | ICD-10-CM | POA: Diagnosis not present

## 2016-10-08 ENCOUNTER — Ambulatory Visit
Admission: RE | Admit: 2016-10-08 | Discharge: 2016-10-08 | Disposition: A | Discharge status: Home / Self Care | Payer: 59 | Source: Ambulatory Visit | Attending: Radiation Oncology | Admitting: Radiation Oncology

## 2016-10-08 ENCOUNTER — Encounter: Payer: Self-pay | Admitting: Radiation Oncology

## 2016-10-08 ENCOUNTER — Other Ambulatory Visit: Payer: Self-pay

## 2016-10-08 DIAGNOSIS — Z170 Estrogen receptor positive status [ER+]: Secondary | ICD-10-CM

## 2016-10-08 DIAGNOSIS — C50412 Malignant neoplasm of upper-outer quadrant of left female breast: Secondary | ICD-10-CM

## 2016-10-08 DIAGNOSIS — Z510 Encounter for antineoplastic radiation therapy: Secondary | ICD-10-CM | POA: Diagnosis not present

## 2016-10-11 ENCOUNTER — Other Ambulatory Visit (HOSPITAL_BASED_OUTPATIENT_CLINIC_OR_DEPARTMENT_OTHER): Payer: 59

## 2016-10-11 ENCOUNTER — Ambulatory Visit (HOSPITAL_BASED_OUTPATIENT_CLINIC_OR_DEPARTMENT_OTHER): Payer: 59 | Admitting: Oncology

## 2016-10-11 VITALS — BP 151/53 | HR 56 | Temp 98.4°F | Resp 17 | Ht 61.75 in | Wt 125.6 lb

## 2016-10-11 DIAGNOSIS — C50412 Malignant neoplasm of upper-outer quadrant of left female breast: Secondary | ICD-10-CM

## 2016-10-11 DIAGNOSIS — D0511 Intraductal carcinoma in situ of right breast: Secondary | ICD-10-CM

## 2016-10-11 DIAGNOSIS — C50812 Malignant neoplasm of overlapping sites of left female breast: Secondary | ICD-10-CM

## 2016-10-11 DIAGNOSIS — Z170 Estrogen receptor positive status [ER+]: Secondary | ICD-10-CM

## 2016-10-11 LAB — CBC WITH DIFFERENTIAL/PLATELET
BASO%: 0.5 % (ref 0.0–2.0)
BASOS ABS: 0 10*3/uL (ref 0.0–0.1)
EOS ABS: 0.2 10*3/uL (ref 0.0–0.5)
EOS%: 3.5 % (ref 0.0–7.0)
HCT: 37.3 % (ref 34.8–46.6)
HGB: 12.2 g/dL (ref 11.6–15.9)
LYMPH#: 1.3 10*3/uL (ref 0.9–3.3)
LYMPH%: 30.4 % (ref 14.0–49.7)
MCH: 31.4 pg (ref 25.1–34.0)
MCHC: 32.7 g/dL (ref 31.5–36.0)
MCV: 96.1 fL (ref 79.5–101.0)
MONO#: 0.5 10*3/uL (ref 0.1–0.9)
MONO%: 10.6 % (ref 0.0–14.0)
NEUT#: 2.4 10*3/uL (ref 1.5–6.5)
NEUT%: 55 % (ref 38.4–76.8)
Platelets: 192 10*3/uL (ref 145–400)
RBC: 3.88 10*6/uL (ref 3.70–5.45)
RDW: 12.5 % (ref 11.2–14.5)
WBC: 4.3 10*3/uL (ref 3.9–10.3)

## 2016-10-11 LAB — COMPREHENSIVE METABOLIC PANEL
ALT: 12 U/L (ref 0–55)
AST: 19 U/L (ref 5–34)
Albumin: 3.7 g/dL (ref 3.5–5.0)
Alkaline Phosphatase: 103 U/L (ref 40–150)
Anion Gap: 8 mEq/L (ref 3–11)
BUN: 18.5 mg/dL (ref 7.0–26.0)
CHLORIDE: 108 meq/L (ref 98–109)
CO2: 26 mEq/L (ref 22–29)
Calcium: 9.4 mg/dL (ref 8.4–10.4)
Creatinine: 0.7 mg/dL (ref 0.6–1.1)
EGFR: 84 mL/min/{1.73_m2} — AB (ref >90)
GLUCOSE: 88 mg/dL (ref 70–140)
POTASSIUM: 4.3 meq/L (ref 3.5–5.1)
SODIUM: 142 meq/L (ref 136–145)
Total Bilirubin: 0.23 mg/dL (ref 0.20–1.20)
Total Protein: 7.6 g/dL (ref 6.4–8.3)

## 2016-10-11 NOTE — Progress Notes (Signed)
Thank you for the update!

## 2016-10-11 NOTE — Progress Notes (Signed)
[name]  [phone] [phone]     ID: [name] DOB: 07-Mar-1945  MR#: [id number]  MAU#:[id number]  Patient Care Team: Reynold Bowen, MD as PCP - General (Endocrinology) Carman Auxier, Virgie Dad, MD as Consulting Physician (Oncology) Willodean Rosenthal, MD as Referring Physician (Surgery) Kimmick, Linus Mako, MD as Referring Physician (Oncology) Bonner Puna, MD as Referring Physician (Radiation Oncology) Eppie Gibson, MD as Attending Physician (Radiation Oncology) Regina Eck, CNM as Referring Physician (Certified Nurse Midwife) Chauncey Cruel, MD OTHER MD:  CHIEF COMPLAINT: Estrogen receptor positive breast cancer  CURRENT TREATMENT: To start tamoxifen 11/01/2016.   HISTORY OF CURRENT ILLNESS: "[name] [name]" underwent right breast upper inner quadrant lumpectomy for ductal carcinoma in situ, 08/10/2012. She was treated with adjuvant radiation to a total of 48 gray, completed 11/13/2012.  MRI guided biopsy of a central left breast lesion 05/25/2013 showed sclerosing adenosis.  On 07/30/2015 she underwent bilateral diagnostic mammography with tomography. The breast density was category C. In addition to prior scars there was a 0.3 cm area of dystrophic calcifications. This was felt to be likely benign and 6 month follow-up was recommended.  That was performed 01/15/2016. The calcifications now appeared to be associated with an oil cyst.   More recently, in April 2018 the patient's gynecologist palpated a change in the left breast, at the 3:00 area, 3 fingerbreadths from the areola. Left diagnostic mammography and ultrasonography at the [facility] 06/03/2016 showed only changes of prior excisional biopsies. On physical exam however there was a firm thickening in the lateral left breast, at the site of a prior biopsy. Targeted ultrasonography of this area (3:00 radiant 5 cm from the nipple) found an irregular hypoechoic region  measuring 0.8 cm. This underlying the scar. Ultrasound of the left axilla was benign.  Biopsy of the left breast area in question 06/09/2016 showed (S [id number]) and invasive ductal carcinoma, E-cadherin positive, estrogen receptor 80% positive, progesterone receptor 60% positive, both with strong staining intensity, with an MIB-1 of 15% and no HER-2 amplification, the signals ratio being 1.00 and the number per cell 1.15.  The patient then was further evaluated at [facility] and on 07/12/2016 underwent left lumpectomy and sentinel lymph node sampling, with the final pathology (SP [id number]) finding invasive adenocarcinoma with mixed lobular and ductal features, but E-cadherin positive, grade 2, measuring 1.9 cm. Both sentinel lymph nodes were benign.  An Oncotype DX score was obtained on this sample, and it was 21; the patient was referred to radiation oncology, which was done in [place] and completed 10/08/2016.  The patient's subsequent history is as detailed below.  INTERVAL HISTORY: [name] [name] returns to breasts clinic today after an absence of 3 years. We retrieved her data from [facility] and her recent mammographic data and reviewed that in detail before proceeding to decision making  She is still recovering from her radiation treatments. She has mild to moderate fatigue but more importantly she is having an erythematous rash associated with the radiation port area. She is using radiaplexwith some success so far.  REVIEW OF SYSTEMS: She is anxious because she has family in [place] and of course the [place] is heading in that direction. She worries about hot flashes. They occur 5 or 6 times a day currently, and also wake her up at night. She recently developed an ulcer in her tongue. She restarted Valtrex, which she had "go for a while. She has terrible vaginal dryness problems. She is worried about osteoporosis. She goes to the gym  for 5 times a week and also plays golf. A detailed review of systems  today was otherwise stable  PAST MEDICAL HISTORY: Past Medical History:  Diagnosis Date  . Acute asthmatic bronchitis   . Asthma, mild intermittent   . Breast cancer ([place]) 08/2012   right-DCIS, +ER/PR  . Cystocele    Grade 2  . Diverticulosis 2004  . Fibroids 1998  . Fracture of fibula 2007   left;  hit with golf ball  . Heart murmur    nl echo  . History of TMJ disorder   . Hx of radiation therapy 10/11/12- 11/13/12   right breast 4800 cGy 24 sessions  . Hypertension   . Hypothyroidism   . Osteopenia 2002  . Raynaud's disease   . Shingles   . Vasomotor rhinitis     PAST SURGICAL HISTORY: Past Surgical History:  Procedure Laterality Date  . ABDOMINAL HYSTERECTOMY    . BREAST BIOPSY Left 2015   MRI- Benign  . BREAST BIOPSY Right 07/05/2012   Stereo- Malignant  . BREAST EXCISIONAL BIOPSY Left   . BREAST EXCISIONAL BIOPSY Left   . BREAST LUMPECTOMY Right 08/10/12   [facility], NO RESIDUAL DISEASE  . HYSTEROSCOPY WITH RESECTOSCOPE  2002   submucous fibroid  . INCONTINENCE SURGERY    . MINOR BREAST BIOPSY Left    times two  . ROTATOR CUFF REPAIR Right 2010  . THYROIDECTOMY      FAMILY HISTORY Family History  Problem Relation Age of Onset  . Aneurysm Mother   . Hypertension Sister   . Cancer Sister        tongue  . Clotting disorder Maternal Grandfather        liver  . Cancer Maternal Grandfather        liver  The patient's father died of pneumonia at age 83. The patient's mother died at the age of 10 from a ruptured brain aneurysm. The patient had no brothers. The patient had 4 sisters. One sister developed tongue cancer. There is no history of breast or ovarian cancer in the family to her knowledge  GYNECOLOGIC HISTORY:  Patient's last menstrual period was 01/02/2000. Menarche age 1, first live birth age 45, the patient is [place] P2. She stopped having periods around the year 2000. She used hormone replacement approximately 13 years, until July 2014.  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 Her husband Trilby Way a local [employer]. Son [name] [name] lives in [place], works in Insurance underwriter. Son [name] [name] is currently [employer]. Incidentally [name] [name] had a chronic granulomatous disease and received a stem cell transplant from his brother, with excellent results. The patient has 5 grandchildren. She attends [employer]     ADVANCED DIRECTIVES:    HEALTH MAINTENANCE: Social History  Substance Use Topics  . Smoking status: Never Smoker  . Smokeless tobacco: Never Used  . Alcohol use 3.6 oz/week    6 Standard drinks or equivalent per week     Colonoscopy:  PAP:  Bone density: 12/10/2014 showed a T score of -2.4   Allergies  Allergen Reactions  . Pneumovax 23  [Pneumococcal Vac Polyvalent] Swelling    Swollen upper arm  . Effexor [Venlafaxine] Nausea And Vomiting  . Penicillins Hives  . Pneumovax [Pneumococcal Polysaccharide Vaccine] Swelling    Current Outpatient Prescriptions  Medication Sig Dispense Refill  . Albuterol Sulfate (PROAIR RESPICLICK) 962 (90 BASE) MCG/ACT AEPB Inhale 2 puffs into the lungs every 6 (six) hours as needed. 1 each  0  . cholecalciferol (VITAMIN D) 1000 UNITS tablet Take 1,000 Units by mouth 2 (two) times a week.    . fluticasone (FLONASE) 50 MCG/ACT nasal spray Place 2 sprays into both nostrils daily.    Marland Kitchen levothyroxine (SYNTHROID, LEVOTHROID) 75 MCG tablet Take 75 mcg by mouth. 6 days a week take 1 pill & 1 day a week take 1/2 a pill    . loratadine (CLARITIN CHILDRENS) 5 MG chewable tablet Chew 5 mg by mouth daily. Uses as needed    . valACYclovir (VALTREX) 500 MG tablet TAKE 1 TABLET BY MOUTH ONCE DAILY, INCREASE TO TWICE DAILY AT ONSET OF OUTBREAK. 45 tablet 11  . Wound Dressings (RADIAGEL) GEL Apply 1 application topically 2 (two) times daily.    Marland Kitchen zolpidem (AMBIEN) 5 MG tablet Take 5 mg by mouth at bedtime as needed for sleep.     No current facility-administered medications for this  visit.     OBJECTIVE: Middle-aged white woman who appears stated age  51:   10/11/16 1608  BP: (!) 151/53  Pulse: (!) 56  Resp: 17  Temp: 98.4 F (36.9 C)  SpO2: 100%     Body mass index is 23.16 kg/m.   Wt Readings from Last 3 Encounters:  10/11/16 125 lb 9.6 oz (57 kg)  09/01/16 125 lb 9.6 oz (57 kg)  06/02/16 125 lb (56.7 kg)      ECOG FS:1 - Symptomatic but completely ambulatory  Ocular: Sclerae unicteric, pupils round and equal Ear-nose-throat: Oropharynx clear and moist Lymphatic: No cervical or supraclavicular adenopathy Lungs no rales or rhonchi Heart regular rate and rhythm Abd soft, nontender, positive bowel sounds MSK no focal spinal tenderness, no joint edema Neuro: non-focal, well-oriented, appropriate affect Breasts: The right breast is status post remote lumpectomy. There is no evidence of local recurrence. The right axilla is benign. The left breast is status post more recent lumpectomy and radiation. There is erythema and a palpable rash particularly in the upper inner aspect of the radiation port area. The cosmetic result is good. The left axilla is benign.   LAB RESULTS:  CMP     Component Value Date/Time   NA 142 10/11/2016 1547   K 4.3 10/11/2016 1547   CL 102 03/11/2008 1418   CO2 26 10/11/2016 1547   GLUCOSE 88 10/11/2016 1547   BUN 18.5 10/11/2016 1547   CREATININE 0.7 10/11/2016 1547   CALCIUM 9.4 10/11/2016 1547   PROT 7.6 10/11/2016 1547   ALBUMIN 3.7 10/11/2016 1547   AST 19 10/11/2016 1547   ALT 12 10/11/2016 1547   ALKPHOS 103 10/11/2016 1547   BILITOT 0.23 10/11/2016 1547   GFRNONAA >60 03/11/2008 1418   GFRAA  03/11/2008 1418    >60        The eGFR has been calculated using the MDRD equation. This calculation has not been validated in all clinical situations. eGFR's persistently <60 mL/min signify possible Chronic Kidney Disease.    No results found for: TOTALPROTELP, ALBUMINELP, A1GS, A2GS, BETS, BETA2SER, GAMS,  MSPIKE, SPEI  No results found for: KPAFRELGTCHN, LAMBDASER, [facility]  Lab Results  Component Value Date   WBC 4.3 10/11/2016   NEUTROABS 2.4 10/11/2016   HGB 12.2 10/11/2016   HCT 37.3 10/11/2016   MCV 96.1 10/11/2016   PLT 192 10/11/2016      Chemistry      Component Value Date/Time   NA 142 10/11/2016 1547   K 4.3 10/11/2016 1547   CL 102 03/11/2008 1418  CO2 26 10/11/2016 1547   BUN 18.5 10/11/2016 1547   CREATININE 0.7 10/11/2016 1547      Component Value Date/Time   CALCIUM 9.4 10/11/2016 1547   ALKPHOS 103 10/11/2016 1547   AST 19 10/11/2016 1547   ALT 12 10/11/2016 1547   BILITOT 0.23 10/11/2016 1547       No results found for: LABCA2  No components found for: YNWGNF621  No results for input(s): INR in the last 168 hours.  No results found for: LABCA2  No results found for: HYQ657  No results found for: QIO962  No results found for: XBM841  No results found for: CA2729  No components found for: HGQUANT  No results found for: CEA1 / No results found for: CEA1   No results found for: AFPTUMOR  No results found for: CHROMOGRNA  No results found for: PSA1  Appointment on 10/11/2016  Component Date Value Ref Range Status  . WBC 10/11/2016 4.3  3.9 - 10.3 10e3/uL Final  . NEUT# 10/11/2016 2.4  1.5 - 6.5 10e3/uL Final  . HGB 10/11/2016 12.2  11.6 - 15.9 g/dL Final  . HCT 10/11/2016 37.3  34.8 - 46.6 % Final  . Platelets 10/11/2016 192  145 - 400 10e3/uL Final  . MCV 10/11/2016 96.1  79.5 - 101.0 fL Final  . MCH 10/11/2016 31.4  25.1 - 34.0 pg Final  . MCHC 10/11/2016 32.7  31.5 - 36.0 g/dL Final  . RBC 10/11/2016 3.88  3.70 - 5.45 10e6/uL Final  . RDW 10/11/2016 12.5  11.2 - 14.5 % Final  . lymph# 10/11/2016 1.3  0.9 - 3.3 10e3/uL Final  . MONO# 10/11/2016 0.5  0.1 - 0.9 10e3/uL Final  . Eosinophils Absolute 10/11/2016 0.2  0.0 - 0.5 10e3/uL Final  . Basophils Absolute 10/11/2016 0.0  0.0 - 0.1 10e3/uL Final  . NEUT% 10/11/2016 55.0   38.4 - 76.8 % Final  . LYMPH% 10/11/2016 30.4  14.0 - 49.7 % Final  . MONO% 10/11/2016 10.6  0.0 - 14.0 % Final  . EOS% 10/11/2016 3.5  0.0 - 7.0 % Final  . BASO% 10/11/2016 0.5  0.0 - 2.0 % Final  . Sodium 10/11/2016 142  136 - 145 mEq/L Final  . Potassium 10/11/2016 4.3  3.5 - 5.1 mEq/L Final  . Chloride 10/11/2016 108  98 - 109 mEq/L Final  . CO2 10/11/2016 26  22 - 29 mEq/L Final  . Glucose 10/11/2016 88  70 - 140 mg/dl Final   Glucose reference range is for nonfasting patients. Fasting glucose reference range is 70- 100.  Marland Kitchen BUN 10/11/2016 18.5  7.0 - 26.0 mg/dL Final  . Creatinine 10/11/2016 0.7  0.6 - 1.1 mg/dL Final  . Total Bilirubin 10/11/2016 0.23  0.20 - 1.20 mg/dL Final  . Alkaline Phosphatase 10/11/2016 103  40 - 150 U/L Final  . AST 10/11/2016 19  5 - 34 U/L Final  . ALT 10/11/2016 12  0 - 55 U/L Final  . Total Protein 10/11/2016 7.6  6.4 - 8.3 g/dL Final  . Albumin 10/11/2016 3.7  3.5 - 5.0 g/dL Final  . Calcium 10/11/2016 9.4  8.4 - 10.4 mg/dL Final  . Anion Gap 10/11/2016 8  3 - 11 mEq/L Final  . EGFR 10/11/2016 84* >90 ml/min/1.73 m2 Final   eGFR is calculated using the CKD-EPI Creatinine Equation (2009)    (this displays the last labs from the last 3 days)  No results found for: TOTALPROTELP, ALBUMINELP, A1GS, A2GS, BETS,  BETA2SER, GAMS, MSPIKE, SPEI (this displays SPEP labs)  No results found for: KPAFRELGTCHN, LAMBDASER, KAPLAMBRATIO (kappa/lambda light chains)  No results found for: HGBA, HGBA2QUANT, HGBFQUANT, HGBSQUAN (Hemoglobinopathy evaluation)   No results found for: LDH  No results found for: IRON, TIBC, IRONPCTSAT (Iron and TIBC)  No results found for: FERRITIN  Urinalysis    Component Value Date/Time   BILIRUBINUR n 04/04/2015 1330   PROTEINUR n 04/04/2015 1330   UROBILINOGEN negative 04/04/2015 1330   NITRITE n 04/04/2015 1330   LEUKOCYTESUR Negative 04/04/2015 1330     STUDIES: No results found.  ELIGIBLE FOR AVAILABLE RESEARCH  PROTOCOL: no  ASSESSMENT: 71 y.o. [place] woman  (1) status post right lumpectomy and margin clearance 08/10/2012 for ductal carcinoma in situ, grade 2 or 3, estrogen and progesterone receptor positive,  (2) adjuvant radiation to the right breast completed 11/13/2012.  (3) status post left breast biopsy 05/25/2013 for sclerosing adenosis.  (4) left breast biopsy 06/09/2016 showed a clinical T1b N0, stage IA invasive ductal carcinoma (E-cadherin positive) estrogen and progesterone receptor positive, HER-2 nonamplified, with an MIB-1 of 15%.  (5) left lumpectomy and sentinel lymph node sampling 07/12/2016 showed a pT1c pN0, stage IA invasive ductal carcinoma, grade 2, with clear margins.  (6) Oncotype DX score of 21 predicted a 10 year risk of outside the breast recurrence of 13% if the patient's only systemic therapy was tamoxifen for 5 years. It also predicted no benefit from chemotherapy  (7) adjuvant radiation completed 10/08/2016  (8) to start tamoxifen 11/01/2016  PLAN: We spent the better part of today's 50 minute appointment discussing her situation in detail and her choices of anti-estrogens. Briefly, she understands she had a stage I invasive ductal carcinoma which was estrogen dependent. She also understands that her Oncotype predicts an 87% chance of her not having this breast cancer recurring outside the breast in the next 10 years if she takes tamoxifen for 5 years. If instead she takes anastrozole for 5 years or if she takes tamoxifen for 3 years and anastrozole for 2 years for example she can bring that up to about 89%.  We discussed the difference between tamoxifen and anastrozole in detail. She understands that anastrozole and the aromatase inhibitors in general work by blocking estrogen production. Accordingly vaginal dryness, decrease in bone density, and of course hot flashes can result. The aromatase inhibitors can also negatively affect the cholesterol profile, although  that is a minor effect. One out of 5 women on aromatase inhibitors we will feel "old and achy". This arthralgia/myalgia syndrome, which resembles fibromyalgia clinically, does resolve with stopping the medications. Accordingly this is not a reason to not try an aromatase inhibitor but it is a frequent reason to stop it (in other words 20% of women will not be able to tolerate these medications).  Tamoxifen on the other hand does not block estrogen production. It does not "take away a woman's estrogen". It blocks the estrogen receptor in breast cells. Like anastrozole, it can also cause hot flashes. As opposed to anastrozole, tamoxifen has many estrogen-like effects. It is technically an estrogen receptor modulator. This means that in some tissues tamoxifen works like estrogen-- for example it helps strengthen the bones. It tends to improve the cholesterol profile. It can cause thickening of the endometrial lining, and even endometrial polyps or rarely cancer of the uterus.(The risk of uterine cancer due to tamoxifen is one additional cancer per thousand women year). It can cause vaginal wetness or stickiness. It can cause  blood clots through this estrogen-like effect--the risk of blood clots with tamoxifen is exactly the same as with birth control pills or hormone replacement.  Neither of these agents causes mood changes or weight gain, despite the popular belief that they can have these side effects. We have data from studies comparing either of these drugs with placebo, and in those cases the control group had the same amount of weight gain and depression as the group that took the drug.  The 2 problems that are worrying Elza are first of all her osteoporosis, and secondly vaginal dryness issues. Both of those would be considerably more of an issue on anastrozole. On the other hand tamoxifen not only strengthens the bones but would allow her to use vaginal estrogens safely. Given that, she would like to give  tamoxifen a try and if she tolerates it well as she will continue on that for 5 years.  She brought me information on a testosterone/anastrozole implanted treatment that is being tried. We discussed the fact that testosterone does interchange with estrogen, and that adding anastrozole would impede that. However affected this isn't whether it is safe at all has not been established: This needs a study before it can be used in the clinic and unfortunately the study is only being planned, it has not yet been opened.I strongly advised her not to try experimental treatments outside of properly designed experiments. She was able to understand the discussion.  The plan then is for her to start tamoxifen 11/01/2016. She will see me a couple months later to assess tolerance. She will call with any problems that may develop before that visit. Assuming she tolerates it, the plan would be to continue that for 5 years  She knows to call for any other issues that may develop before her next visit.  Chauncey Cruel, MD   10/11/2016 5:11 PM Medical Oncology and Hematology [facility] [address] [place], [place] [address] Tel. [phone]    Fax. [phone]

## 2016-10-12 NOTE — Progress Notes (Signed)
  Radiation Oncology         [phone] ________________________________  Name: [name] [name] MRN: [id number]  Date: 10/08/2016  DOB: 10-May-1945  End of Treatment Note  Diagnosis:   71 y.o. female with Stage IA (pT1c, pN0) Left Breast UOQ Invasive Ductal Carcinoma, ER 80% / PR 8% / Her2(-), Grade II     Indication for treatment:  Curative       Radiation treatment dates:   09/16/2016 - 10/08/2016  Site/dose:   The left breast was treated to 42.56 Gy in 16 fractions of 2.66 Gy.  Beams/energy:   3D // Pieter Partridge, 10X Photon  Narrative: The patient tolerated radiation treatment relatively well.  She denied any pain but did have some mild fatigue near the end of her treatment. She also had redness and itching to her left breast, for which she is using RadiaPlex twice daily as directed.  Plan: The patient has completed radiation treatment. The patient will return to radiation oncology clinic for routine followup in one month. I advised them to call or return sooner if they have any questions or concerns related to their recovery or treatment.  -----------------------------------  Eppie Gibson, MD  This document serves as a record of services personally performed by Eppie Gibson, MD. It was created on her behalf by Rae Lips, a trained medical scribe. The creation of this record is based on the scribe's personal observations and the provider's statements to them. This document has been checked and approved by the attending provider.

## 2016-10-28 ENCOUNTER — Telehealth: Payer: Self-pay

## 2016-10-28 DIAGNOSIS — C50512 Malignant neoplasm of lower-outer quadrant of left female breast: Secondary | ICD-10-CM

## 2016-10-28 DIAGNOSIS — Z170 Estrogen receptor positive status [ER+]: Principal | ICD-10-CM

## 2016-10-28 MED ORDER — TAMOXIFEN CITRATE 20 MG PO TABS: 20.0000 mg | ORAL_TABLET | Freq: Every day | ORAL | 0 refills | Status: DC

## 2016-10-28 NOTE — Telephone Encounter (Signed)
Pt called that dr Jana Hakim wants her to start tamoxifen on 10/1 and has not sent in RX. Per OV note this is correct. Sent rx to [facility] and [facility] for pt

## 2016-11-17 ENCOUNTER — Ambulatory Visit: Payer: Self-pay | Admitting: Radiation Oncology

## 2016-11-19 ENCOUNTER — Encounter: Payer: Self-pay | Admitting: Radiation Oncology

## 2016-11-24 ENCOUNTER — Encounter: Payer: Self-pay | Admitting: Radiation Oncology

## 2016-11-24 ENCOUNTER — Ambulatory Visit
Admission: RE | Admit: 2016-11-24 | Discharge: 2016-11-24 | Disposition: A | Discharge status: Home / Self Care | Payer: 59 | Source: Ambulatory Visit | Attending: Radiation Oncology | Admitting: Radiation Oncology

## 2016-11-24 VITALS — BP 134/66 | HR 59 | Temp 97.9°F | Ht 61.75 in | Wt 124.4 lb

## 2016-11-24 DIAGNOSIS — Z08 Encounter for follow-up examination after completed treatment for malignant neoplasm: Secondary | ICD-10-CM | POA: Insufficient documentation

## 2016-11-24 DIAGNOSIS — Z170 Estrogen receptor positive status [ER+]: Secondary | ICD-10-CM

## 2016-11-24 DIAGNOSIS — Z853 Personal history of malignant neoplasm of breast: Secondary | ICD-10-CM | POA: Diagnosis not present

## 2016-11-24 DIAGNOSIS — C50412 Malignant neoplasm of upper-outer quadrant of left female breast: Secondary | ICD-10-CM | POA: Diagnosis present

## 2016-11-24 HISTORY — DX: Personal history of irradiation: Z92.3

## 2016-11-24 NOTE — Progress Notes (Signed)
[name] [name] presents for follow up of radiation completed 10/08/2016 to her Left Breast. She denies pain. She has mild fatigue. She is exercising daily. She reports dry lips and dry mouth. Her Left [name] has healed, she has several red areas that are slowly improving. She is not using any creams at this time. She is taking tamoxifen without difficulty. She will see Dr. Jana Hakim in December for evaluation of tamoxifen. She has questions about scheduling future mammograms.   BP 134/66   Pulse (!) 59   Temp 97.9 F (36.6 C)   Ht 5' 1.75" (1.568 m)   Wt 124 lb 6.4 oz (56.4 kg)   LMP 01/02/2000   SpO2 100% Comment: Room air  BMI 22.94 kg/m    Wt Readings from Last 3 Encounters:  11/24/16 124 lb 6.4 oz (56.4 kg)  10/11/16 125 lb 9.6 oz (57 kg)  09/01/16 125 lb 9.6 oz (57 kg)

## 2016-11-24 NOTE — Progress Notes (Signed)
Radiation Oncology         [phone] ________________________________  Name: [name] [name] MRN: [id number]  Date: 11/24/2016  DOB: January 24, 1946  Follow-Up Visit Note  Outpatient  CC: Reynold Bowen, MD  Shirline Frees, MD  Diagnosis and Prior Radiotherapy: 71 y.o. female with Stage IA (pT1c, pN0) LeftBreast UOQ Invasive Ductal Carcinoma, ER 80%/ PR 8%/ Her2(-), Grade II         ICD-10-CM   1. Carcinoma of upper-outer quadrant of left breast in female, estrogen receptor positive ([place]) C50.412    Z17.0      09/16/2016 - 10/08/2016: The left breast was treated to 42.56 Gy in 16 fractions of 2.66 Gy.  CHIEF COMPLAINT: Here for follow-up and surveillance of breast cancer  Narrative:  The patient returns today for routine follow-up of radiation treatment to her left breast.  She denies pain. She has mild fatigue. She is exercising daily. She reports dry lips and dry mouth. She also notes recent mouth ulcers. Her left breast has healed, and she has several red areas that are slowly improving. She is not using any creams at this time. She is taking tamoxifen without difficulty. She will see Dr. Jana Hakim in December for evaluation of tamoxifen. She asked questions about scheduling future mammograms.                               ALLERGIES:  is allergic to pneumovax 23  [pneumococcal vac polyvalent]; effexor [venlafaxine]; penicillins; and pneumovax [pneumococcal polysaccharide vaccine].  Meds: Current Outpatient Prescriptions  Medication Sig Dispense Refill  . Albuterol Sulfate (PROAIR RESPICLICK) 409 (90 BASE) MCG/ACT AEPB Inhale 2 puffs into the lungs every 6 (six) hours as needed. 1 each 0  . cholecalciferol (VITAMIN D) 1000 UNITS tablet Take 1,000 Units by mouth 2 (two) times a week.    . fluticasone (FLONASE) 50 MCG/ACT nasal spray Place 2 sprays into both nostrils daily.    Marland Kitchen levothyroxine (SYNTHROID, LEVOTHROID) 75 MCG tablet Take 75 mcg by mouth. 6 days a week take 1  pill & 1 day a week take 1/2 a pill    . loratadine (CLARITIN CHILDRENS) 5 MG chewable tablet Chew 5 mg by mouth daily. Uses as needed    . tamoxifen (NOLVADEX) 20 MG tablet Take 1 tablet (20 mg total) by mouth daily. 90 tablet 0  . valACYclovir (VALTREX) 500 MG tablet TAKE 1 TABLET BY MOUTH ONCE DAILY, INCREASE TO TWICE DAILY AT ONSET OF OUTBREAK. 45 tablet 11  . zolpidem (AMBIEN) 5 MG tablet Take 5 mg by mouth at bedtime as needed for sleep.    . Wound Dressings (RADIAGEL) GEL Apply 1 application topically 2 (two) times daily.     No current facility-administered medications for this encounter.     Physical Findings: The patient is in no acute distress. Patient is alert and oriented.  height is 5' 1.75" (1.568 m) and weight is 124 lb 6.4 oz (56.4 kg). Her temperature is 97.9 F (36.6 C). Her blood pressure is 134/66 and her pulse is 59 (abnormal). Her oxygen saturation is 100%. .    Left breast shows resolving radiation dermatitis in the upper outer quadrant where there is residual erythema. The rest of her breast is a little hyperpigmented but, overall her skin is healing well.  Lab Findings: Lab Results  Component Value Date   WBC 4.3 10/11/2016   HGB 12.2 10/11/2016   HCT 37.3 10/11/2016  MCV 96.1 10/11/2016   PLT 192 10/11/2016    Radiographic Findings: No results found.  Impression/Plan: healing from radiotherapy    Advised the patient to being using Vitamin E lotion a couple of times a day to aid in skin healing. I also encouraged the patient to stay hydrated and try Biotene products for current dry mouth. I encouraged her to continue with yearly mammography and followup with medical oncology. I will see her back on an as-needed basis. I have encouraged her to call if she has any issues or concerns in the future. I wished her the very best.    _____________________________________   Eppie Gibson, MD   This document serves as a record of services personally performed  by Eppie Gibson, MD. It was created on her behalf by Arlyce Harman, a trained medical scribe. The creation of this record is based on the scribe's personal observations and the provider's statements to them. This document has been checked and approved by the attending provider.

## 2016-12-06 ENCOUNTER — Telehealth: Payer: Self-pay | Admitting: Oncology

## 2016-12-06 ENCOUNTER — Other Ambulatory Visit: Payer: Self-pay | Admitting: Oncology

## 2016-12-06 NOTE — Telephone Encounter (Signed)
Scheduled appt per 11/5 sch message - left message with appt date and time and sent reminder letter in the mail.

## 2016-12-15 ENCOUNTER — Encounter: Payer: Self-pay | Admitting: Oncology

## 2016-12-24 ENCOUNTER — Telehealth: Payer: Self-pay | Admitting: Oncology

## 2016-12-24 NOTE — Telephone Encounter (Signed)
Called patient regarding 12/28 reschedule

## 2017-01-03 ENCOUNTER — Encounter: Payer: Self-pay | Admitting: Acute Care

## 2017-01-03 ENCOUNTER — Ambulatory Visit: Payer: 59 | Admitting: Acute Care

## 2017-01-03 VITALS — BP 136/70 | HR 61 | Ht 62.5 in | Wt 125.4 lb

## 2017-01-03 DIAGNOSIS — R05 Cough: Secondary | ICD-10-CM

## 2017-01-03 DIAGNOSIS — R059 Cough, unspecified: Secondary | ICD-10-CM

## 2017-01-03 DIAGNOSIS — J45901 Unspecified asthma with (acute) exacerbation: Secondary | ICD-10-CM | POA: Diagnosis not present

## 2017-01-03 LAB — NITRIC OXIDE: NITRIC OXIDE: 30

## 2017-01-03 MED ORDER — ALBUTEROL SULFATE 108 (90 BASE) MCG/ACT IN AEPB: 2.0000 | INHALATION_SPRAY | Freq: Four times a day (QID) | RESPIRATORY_TRACT | 3 refills | Status: DC | PRN

## 2017-01-03 MED ORDER — PREDNISONE 10 MG PO TABS: ORAL_TABLET | ORAL | 0 refills | Status: DC

## 2017-01-03 NOTE — Progress Notes (Signed)
[place]  Telephone:([phone]) [phone] [phone]) [phone]     ID: LINDEN MIKES DOB: 09-29-1945  MR#: [id number]  LHT#:[id number]  Patient Care Team: Reynold Bowen, MD as PCP - General (Endocrinology) Khloie Hamada, Virgie Dad, MD as Consulting Physician (Oncology) Willodean Rosenthal, MD as Referring Physician (Surgery) Kimmick, Linus Mako, MD as Referring Physician (Oncology) Bonner Puna, MD as Referring Physician (Radiation Oncology) Eppie Gibson, MD as Attending Physician (Radiation Oncology) Regina Eck, CNM as Referring Physician (Certified Nurse Midwife) OTHER MD:  CHIEF COMPLAINT: Estrogen receptor positive breast cancer  CURRENT TREATMENT: Tamoxifen   HISTORY OF CURRENT ILLNESS: From the original intake note:  "[name] [name]" underwent right breast upper inner quadrant lumpectomy for ductal carcinoma in situ, 08/10/2012. She was treated with adjuvant radiation to a total of 48 gray, completed 11/13/2012.  MRI guided biopsy of a central left breast lesion 05/25/2013 showed sclerosing adenosis.  On 07/30/2015 she underwent bilateral diagnostic mammography with tomography. The breast density was category C. In addition to prior scars there was a 0.3 cm area of dystrophic calcifications. This was felt to be likely benign and 6 month follow-up was recommended.  That was performed 01/15/2016. The calcifications now appeared to be associated with an oil cyst.   More recently, in April 2018 the patient's gynecologist palpated a change in the left breast, at the 3:00 area, 3 fingerbreadths from the areola. Left diagnostic mammography and ultrasonography at the [facility] 06/03/2016 showed only changes of prior excisional biopsies. On physical exam however there was a firm thickening in the lateral left breast, at the site of a prior biopsy. Targeted ultrasonography of this area (3:00 radiant 5 cm from the nipple) found an irregular hypoechoic region measuring 0.8  cm. This underlying the scar. Ultrasound of the left axilla was benign.  Biopsy of the left breast area in question 06/09/2016 showed (S [id number]) and invasive ductal carcinoma, E-cadherin positive, estrogen receptor 80% positive, progesterone receptor 60% positive, both with strong staining intensity, with an MIB-1 of 15% and no HER-2 amplification, the signals ratio being 1.00 and the number per cell 1.15.  The patient then was further evaluated at [facility] and on 07/12/2016 underwent left lumpectomy and sentinel lymph node sampling, with the final pathology (SP [id number]) finding invasive adenocarcinoma with mixed lobular and ductal features, but E-cadherin positive, grade 2, measuring 1.9 cm. Both sentinel lymph nodes were benign.  An Oncotype DX score was obtained on this sample, and it was 21; the patient was referred to radiation oncology, which was done in [place] and completed 10/08/2016.  The patient's subsequent history is as detailed below.  INTERVAL HISTORY: [name] [name] returns today for follow-up and treatment of her estrogen receptor positive breast cancer.  She reports that she is tolerating tamoxifen well. She reports that her hot flashes come and go and some nights are worse than others. She notes that she gets about 3 or 4 per day and once or twice at night.    REVIEW OF SYSTEMS: [name] [name] reports that her asthma has been giving her trouble more recently. She notes that she visited the doctor yesterday and she was given 40 mg of prednisone for 2 days to ease coughing. She reports that she hasn't had an out break in a long time and her asthma acts up when she gets hot. She is still taking albuterol as needed. For exercise, she goes to step and sweat classes. She says she also gets on the elliptical, treadmill and weightlifts. She denies unusual headaches,  visual changes, nausea, vomiting, or dizziness. There has been no unusual cough, phlegm production, or pleurisy. This been no change in  bowel or bladder habits. She denies unexplained fatigue or unexplained weight loss, bleeding, rash, or fever. A detailed review of systems was otherwise stable.    PAST MEDICAL HISTORY: Past Medical History:  Diagnosis Date  . Acute asthmatic bronchitis   . Asthma, mild intermittent   . Breast cancer ([place]) 08/2012   right-DCIS, +ER/PR  . Cystocele    Grade 2  . Diverticulosis 2004  . Fibroids 1998  . Fracture of fibula 2007   left;  hit with golf ball  . Heart murmur    nl echo  . History of radiation therapy 09/16/2016- 10/08/2016   Left Breast. 42.56 Gy in 16 fractions.   . History of TMJ disorder   . Hx of radiation therapy 10/11/12- 11/13/12   right breast 4800 cGy 24 sessions  . Hypertension   . Hypothyroidism   . Osteopenia 2002  . Raynaud's disease   . Shingles   . Vasomotor rhinitis     PAST SURGICAL HISTORY: Past Surgical History:  Procedure Laterality Date  . ABDOMINAL HYSTERECTOMY    . BREAST BIOPSY Left 2015   MRI- Benign  . BREAST BIOPSY Right 07/05/2012   Stereo- Malignant  . BREAST EXCISIONAL BIOPSY Left   . BREAST EXCISIONAL BIOPSY Left   . BREAST LUMPECTOMY Right 08/10/12   [facility], NO RESIDUAL DISEASE  . HYSTEROSCOPY WITH RESECTOSCOPE  2002   submucous fibroid  . INCONTINENCE SURGERY    . MINOR BREAST BIOPSY Left    times two  . ROTATOR CUFF REPAIR Right 2010  . THYROIDECTOMY      FAMILY HISTORY Family History  Problem Relation Age of Onset  . Aneurysm Mother   . Hypertension Sister   . Cancer Sister        tongue  . Clotting disorder Maternal Grandfather        liver  . Cancer Maternal Grandfather        liver  The patient's father died of pneumonia at age 72. The patient's mother died at the age of 72 from a ruptured brain aneurysm. The patient had no brothers. The patient had 4 sisters. One sister developed tongue cancer. There is no history of breast or ovarian cancer in the family to her knowledge  GYNECOLOGIC HISTORY:  Patient's last  menstrual period was 01/02/2000. Menarche age 71, first live birth age 71, the patient is [place] P2. She stopped having periods around the year 2000. She used hormone replacement approximately 13 years, until July 2014.  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 Her husband Lilla Callejo owns a local [employer] franchise. Son Wille Glaser lives in [place], works in Insurance underwriter. Son Quay Burow is currently [employer]. Incidentally Wille Glaser had a chronic granulomatous disease and received a stem cell transplant from his brother, with excellent results. The patient has 5 grandchildren. She attends [employer]     ADVANCED DIRECTIVES:    HEALTH MAINTENANCE: Social History   Tobacco Use  . Smoking status: Never Smoker  . Smokeless tobacco: Never Used  Substance Use Topics  . Alcohol use: Yes    Alcohol/week: 3.6 oz    Types: 6 Standard drinks or equivalent per week  . Drug use: No     Colonoscopy:  PAP:  Bone density: 12/10/2014 showed a T score of -2.4   Allergies  Allergen Reactions  . Pneumovax 23  [Pneumococcal Vac  Polyvalent] Swelling    Swollen upper arm  . Effexor [Venlafaxine] Nausea And Vomiting  . Penicillins Hives  . Pneumovax [Pneumococcal Polysaccharide Vaccine] Swelling    Current Outpatient Medications  Medication Sig Dispense Refill  . Albuterol Sulfate (PROAIR RESPICLICK) 696 (90 Base) MCG/ACT AEPB Inhale 2 puffs into the lungs every 6 (six) hours as needed. 1 each 3  . cholecalciferol (VITAMIN D) 1000 UNITS tablet Take 1,000 Units by mouth 2 (two) times a week.    . fluticasone (FLONASE) 50 MCG/ACT nasal spray Place 2 sprays into both nostrils daily.    Marland Kitchen levothyroxine (SYNTHROID, LEVOTHROID) 75 MCG tablet Take 75 mcg by mouth. 6 days a week take 1 pill & 1 day a week take 1/2 a pill    . loratadine (CLARITIN CHILDRENS) 5 MG chewable tablet Chew 5 mg by mouth daily. Uses as needed    . predniSONE (DELTASONE) 10 MG tablet Take 4 tabs for 2 days, then 3 tabs  for 2 days, 2 tabs for 2 days, then 1 tab for 2 days, then stop. 20 tablet 0  . tamoxifen (NOLVADEX) 20 MG tablet Take 1 tablet (20 mg total) by mouth daily. 90 tablet 0  . valACYclovir (VALTREX) 500 MG tablet TAKE 1 TABLET BY MOUTH ONCE DAILY, INCREASE TO TWICE DAILY AT ONSET OF OUTBREAK. 45 tablet 11  . zolpidem (AMBIEN) 5 MG tablet Take 5 mg by mouth at bedtime as needed for sleep.     No current facility-administered medications for this visit.     OBJECTIVE: Middle-aged white woman in no acute distress  Vitals:   01/04/17 0835  BP: (!) 165/66  Pulse: (!) 56  Resp: 18  Temp: 98.3 F (36.8 C)  SpO2: 100%     Body mass index is 22.46 kg/m.   Wt Readings from Last 3 Encounters:  01/04/17 124 lb 12.8 oz (56.6 kg)  01/03/17 125 lb 6.4 oz (56.9 kg)  11/24/16 124 lb 6.4 oz (56.4 kg)      ECOG FS:1 - Symptomatic but completely ambulatory  Sclerae unicteric, pupils round and equal Oropharynx clear and moist No cervical or supraclavicular adenopathy Lungs no rales or rhonchi Heart regular rate and rhythm Abd soft, nontender, positive bowel sounds MSK no focal spinal tenderness, no upper extremity lymphedema Neuro: nonfocal, well oriented, appropriate affect Breasts: The right breast is status post remote lumpectomy.  There is some induration associated with the incision.  This is unchanged.  The left breast is status post lumpectomy and radiation.  The cosmetic result is good.  There is no evidence of local recurrence.  Both axillae are benign.  LAB RESULTS:  CMP     Component Value Date/Time   NA 142 10/11/2016 1547   K 4.3 10/11/2016 1547   CL 102 03/11/2008 1418   CO2 26 10/11/2016 1547   GLUCOSE 88 10/11/2016 1547   BUN 18.5 10/11/2016 1547   CREATININE 0.7 10/11/2016 1547   CALCIUM 9.4 10/11/2016 1547   PROT 7.6 10/11/2016 1547   ALBUMIN 3.7 10/11/2016 1547   AST 19 10/11/2016 1547   ALT 12 10/11/2016 1547   ALKPHOS 103 10/11/2016 1547   BILITOT 0.23 10/11/2016  1547   GFRNONAA >60 03/11/2008 1418   GFRAA  03/11/2008 1418    >60        The eGFR has been calculated using the MDRD equation. This calculation has not been validated in all clinical situations. eGFR's persistently <60 mL/min signify possible Chronic Kidney Disease.  No results found for: TOTALPROTELP, ALBUMINELP, A1GS, A2GS, BETS, BETA2SER, GAMS, MSPIKE, SPEI  No results found for: Nils Pyle, [facility]  Lab Results  Component Value Date   WBC 4.3 10/11/2016   NEUTROABS 2.4 10/11/2016   HGB 12.2 10/11/2016   HCT 37.3 10/11/2016   MCV 96.1 10/11/2016   PLT 192 10/11/2016      Chemistry      Component Value Date/Time   NA 142 10/11/2016 1547   K 4.3 10/11/2016 1547   CL 102 03/11/2008 1418   CO2 26 10/11/2016 1547   BUN 18.5 10/11/2016 1547   CREATININE 0.7 10/11/2016 1547      Component Value Date/Time   CALCIUM 9.4 10/11/2016 1547   ALKPHOS 103 10/11/2016 1547   AST 19 10/11/2016 1547   ALT 12 10/11/2016 1547   BILITOT 0.23 10/11/2016 1547       No results found for: LABCA2  No components found for: IRCVEL381  No results for input(s): INR in the last 168 hours.  No results found for: LABCA2  No results found for: OFB510  No results found for: CHE527  No results found for: POE423  No results found for: CA2729  No components found for: HGQUANT  No results found for: CEA1 / No results found for: CEA1   No results found for: AFPTUMOR  No results found for: CHROMOGRNA  No results found for: [place] Visit on 01/03/2017  Component Date Value Ref Range Status  . Nitric Oxide 01/03/2017 30   Final    (this displays the last labs from the last 3 days)  No results found for: TOTALPROTELP, ALBUMINELP, A1GS, A2GS, BETS, BETA2SER, GAMS, MSPIKE, SPEI (this displays SPEP labs)  No results found for: KPAFRELGTCHN, LAMBDASER, KAPLAMBRATIO (kappa/lambda light chains)  No results found for: HGBA, HGBA2QUANT, HGBFQUANT,  HGBSQUAN (Hemoglobinopathy evaluation)   No results found for: LDH  No results found for: IRON, TIBC, IRONPCTSAT (Iron and TIBC)  No results found for: FERRITIN  Urinalysis    Component Value Date/Time   BILIRUBINUR n 04/04/2015 1330   PROTEINUR n 04/04/2015 1330   UROBILINOGEN negative 04/04/2015 1330   NITRITE n 04/04/2015 1330   LEUKOCYTESUR Negative 04/04/2015 1330     STUDIES: No results found.   ELIGIBLE FOR AVAILABLE RESEARCH PROTOCOL: no  ASSESSMENT: 71 y.o. [place] woman  (1) status post right lumpectomy and margin clearance 08/10/2012 for ductal carcinoma in situ, grade 2 or 3, estrogen and progesterone receptor positive,  (2) adjuvant radiation to the right breast completed 11/13/2012.  (3) status post left breast biopsy 05/25/2013 for sclerosing adenosis.  (4) left breast biopsy 06/09/2016 showed a clinical T1b N0, stage IA invasive ductal carcinoma (E-cadherin positive) estrogen and progesterone receptor positive, HER-2 nonamplified, with an MIB-1 of 15%.  (5) left lumpectomy and sentinel lymph node sampling 07/12/2016 showed a pT1c pN0, stage IA invasive ductal carcinoma, grade 2, with clear margins.  (6) Oncotype DX score of 21 predicted a 10 year risk of outside the breast recurrence of 13% if the patient's only systemic therapy was tamoxifen for 5 years. It also predicted no benefit from chemotherapy  (7) adjuvant radiation completed 10/08/2016  (8) started tamoxifen 11/01/2016  PLAN: [name] [name] is tolerating tamoxifen generally well.  She is having hot flashes, as expected.  They once at night bother her the most.  We discussed gabapentin, which she tells me she has taken in the past for her Raynaud's.  We are going to go with 100 mg at bedtime,  and if that is adequate there is no need to take more, but otherwise she will increase the dose to 200 mg.  She will let me know if she has any side effects and particularly if she feels groggy in the  morning  We also discussed venlafaxine but she tells me she took that in the past, had headaches, and is not interested in repeating that experience  She is concerned about vaginal dryness issues.  I am making a referral to our physical therapy pelvic health program.  Otherwise she sees Dr. Forde Dandy in November and May and sees Dr. Hollice Espy also in May.  I have made her a tentative appointment with me in March.  If everything is going swimming really she will cancel that appointment but in any case she will see me in August and from the August visit on I will start seeing her on a yearly this until she completes her 5 years of follow-up  She knows to call for any other issues that may develop before her next visit    Leshae Mcclay, Virgie Dad, MD  01/04/17 8:51 AM Medical Oncology and Hematology [facility] [place], [place] [address] Tel. [phone]    Fax. [phone]    This document serves as a record of services personally performed by Lurline Del, MD. It was created on his behalf by Sheron Nightingale, a trained medical scribe. The creation of this record is based on the scribe's personal observations and the provider's statements to them.   I have reviewed the above documentation for accuracy and completeness, and I agree with the above.

## 2017-01-03 NOTE — Assessment & Plan Note (Signed)
Elevated FENO with non-productive cough Plan: Prednisone taper; 10 mg tablets: 4 tabs x 2 days, 3 tabs x 2 days, 2 tabs x 2 days 1 tab x 2 days then stop. Delsym every 12 hours as directed. Therapeutic trial of Prilosec 20 mg once daily until you see Dr. Melvyn Novas. Sips of water instead of throat clearing. Sugar free jolly ranchers for throat soothing. Avoid menthol and mint. Follow GERD diet We will renew your proair. Follow up with Dr. Melvyn Novas in 1 week. Please contact office for sooner follow up if symptoms do not improve or worsen or seek emergency care  Have a Happy Holiday

## 2017-01-03 NOTE — Progress Notes (Signed)
History of Present Illness [name] [name] is a 71 y.o. female never smoker previously followed for atypical asthma by Dr. Lake Bells,  last seen by Dr Melvyn Novas 05/2016 .   01/03/2017 Acute Visit for dry cough: Pt. Was last seen by Dr. Melvyn Novas 05/2016. At that time she had a head cold. At that time plan was as follows: Stop asthmanex unless you are needing the proair on a more regular basis than what you've been doing (more than twice weekly for more than a few weeks)  Only use your albuterol (proair respiclick)  as a rescue medication to be used if you can't catch your breath by resting or doing a relaxed purse lip breathing pattern.  - The less you use it, the better it will work when you need it. - Ok to use up to 2 puffs  every 4 hours if you must but call for immediate appointment if use goes up over your usual need - Don't leave home without it !!  (think of it like the spare tire for your car)   For head colds > advil cold and sinus   For itching sneezy runny nose ok to use clariton (ok to use both advil cold and sinus and clariton but not clariton D   She presents today with complaints of a dry cough.She states that this started this weekend ( Sunday 01/02/2017). She states she has not had fever, she is on Tamoxafin starting 2 months ago..She denies any shortness of breath. The cough is non-productive.She denies any post nasal drip, or reflux. She states she has not used any of her inhalers per Dr/ Wert's instructions at her last OV. She denies fever, chest pain, orthopnea or hemoptysis.  Test Results: 01/03/2017>>FENO>>30 PPB  CBC Latest Ref Rng & Units 10/11/2016 01/11/2013 05/24/2012  WBC 3.9 - 10.3 10e3/uL 4.3 5.1 -  Hemoglobin 11.6 - 15.9 g/dL 12.2 12.6 12.9  Hematocrit 34.8 - 46.6 % 37.3 39.6 -  Platelets 145 - 400 10e3/uL 192 233 -    BMP Latest Ref Rng & Units 10/11/2016 01/11/2013 03/11/2008  Glucose 70 - 140 mg/dl 88 85 75  BUN 7.0 - 26.0 mg/dL 18.5 13.9 8  Creatinine 0.6 - 1.1  mg/dL 0.7 0.7 0.63  Sodium 136 - 145 mEq/L 142 143 133(L)  Potassium 3.5 - 5.1 mEq/L 4.3 3.8 3.6  Chloride 96 - 112 mEq/L - - 102  CO2 22 - 29 mEq/L 26 27 28   Calcium 8.4 - 10.4 mg/dL 9.4 9.6 8.7      Past medical hx Past Medical History:  Diagnosis Date  . Acute asthmatic bronchitis   . Asthma, mild intermittent   . Breast cancer ([place]) 08/2012   right-DCIS, +ER/PR  . Cystocele    Grade 2  . Diverticulosis 2004  . Fibroids 1998  . Fracture of fibula 2007   left;  hit with golf ball  . Heart murmur    nl echo  . History of radiation therapy 09/16/2016- 10/08/2016   Left Breast. 42.56 Gy in 16 fractions.   . History of TMJ disorder   . Hx of radiation therapy 10/11/12- 11/13/12   right breast 4800 cGy 24 sessions  . Hypertension   . Hypothyroidism   . Osteopenia 2002  . Raynaud's disease   . Shingles   . Vasomotor rhinitis      Social History   Tobacco Use  . Smoking status: Never Smoker  . Smokeless tobacco: Never Used  Substance Use Topics  .  Alcohol use: Yes    Alcohol/week: 3.6 oz    Types: 6 Standard drinks or equivalent per week  . Drug use: No    Ms.Ra reports that  has never smoked. she has never used smokeless tobacco. She reports that she drinks about 3.6 oz of alcohol per week. She reports that she does not use drugs.  Tobacco Cessation: Never Smoker  Past surgical hx, Family hx, Social hx all reviewed.  Current Outpatient Medications on File Prior to Visit  Medication Sig  . cholecalciferol (VITAMIN D) 1000 UNITS tablet Take 1,000 Units by mouth 2 (two) times a week.  . fluticasone (FLONASE) 50 MCG/ACT nasal spray Place 2 sprays into both nostrils daily.  Marland Kitchen levothyroxine (SYNTHROID, LEVOTHROID) 75 MCG tablet Take 75 mcg by mouth. 6 days a week take 1 pill & 1 day a week take 1/2 a pill  . loratadine (CLARITIN CHILDRENS) 5 MG chewable tablet Chew 5 mg by mouth daily. Uses as needed  . tamoxifen (NOLVADEX) 20 MG tablet Take 1 tablet (20 mg  total) by mouth daily.  . valACYclovir (VALTREX) 500 MG tablet TAKE 1 TABLET BY MOUTH ONCE DAILY, INCREASE TO TWICE DAILY AT ONSET OF OUTBREAK.  Marland Kitchen zolpidem (AMBIEN) 5 MG tablet Take 5 mg by mouth at bedtime as needed for sleep.   No current facility-administered medications on file prior to visit.      Allergies  Allergen Reactions  . Pneumovax 23  [Pneumococcal Vac Polyvalent] Swelling    Swollen upper arm  . Effexor [Venlafaxine] Nausea And Vomiting  . Penicillins Hives  . Pneumovax [Pneumococcal Polysaccharide Vaccine] Swelling    Review Of Systems:  Constitutional:   No  weight loss, night sweats,  Fevers, chills, fatigue, or  lassitude.  HEENT:   No headaches,  Difficulty swallowing,  Tooth/dental problems, or  Sore throat,                No sneezing, itching, ear ache, nasal congestion, post nasal drip,   CV:  No chest pain,  Orthopnea, PND, swelling in lower extremities, anasarca, dizziness, palpitations, syncope.   GI  No heartburn, indigestion, abdominal pain, nausea, vomiting, diarrhea, change in bowel habits, loss of appetite, bloody stools.   Resp: No shortness of breath with exertion or at rest.  No excess mucus, no productive cough,  + non-productive cough,  No coughing up of blood.  No change in color of mucus.  No wheezing.  No chest wall deformity  Skin: no rash or lesions.  GU: no dysuria, change in color of urine, no urgency or frequency.  No flank pain, no hematuria   MS:  No joint pain or swelling.  No decreased range of motion.  No back pain.  Psych:  No change in mood or affect. No depression or anxiety.  No memory loss.   Vital Signs BP 136/70 (BP Location: Left Arm, Cuff Size: Normal)   Pulse 61   Ht 5' 2.5" (1.588 m)   Wt 125 lb 6.4 oz (56.9 kg)   LMP 01/02/2000   SpO2 96%   BMI 22.57 kg/m    Physical Exam:  General- No distress,  A&Ox3, pleasant ENT: No sinus tenderness, TM clear, pale nasal mucosa, no oral exudate,no post nasal drip, no  LAN Cardiac: S1, S2, regular rate and rhythm, no murmur Chest: No wheeze/ rales/ dullness; no accessory muscle use, no nasal flaring, no sternal retractions Abd.: Soft Non-tender,non-distended Ext: No clubbing cyanosis, edema Neuro:  normal strength, MAE x  4 , A&O x 3 Skin: No rashes, warm and dry Psych: normal mood and behavior   Assessment/Plan  Asthmatic bronchitis with acute exacerbation Elevated FENO with non-productive cough Plan: Prednisone taper; 10 mg tablets: 4 tabs x 2 days, 3 tabs x 2 days, 2 tabs x 2 days 1 tab x 2 days then stop. Delsym every 12 hours as directed. Therapeutic trial of Prilosec 20 mg once daily until you see Dr. Melvyn Novas. Sips of water instead of throat clearing. Sugar free jolly ranchers for throat soothing. Avoid menthol and mint. Follow GERD diet We will renew your proair. Follow up with Dr. Melvyn Novas in 1 week. Please contact office for sooner follow up if symptoms do not improve or worsen or seek emergency care  Have a [place], NP 01/03/2017  10:14 AM

## 2017-01-03 NOTE — Patient Instructions (Addendum)
It is good to see you today. Prednisone taper; 10 mg tablets: 4 tabs x 2 days, 3 tabs x 2 days, 2 tabs x 2 days 1 tab x 2 days then stop. Delsym every 12 hours as directed. Therapeutic trial of Prilosec 20 mg once daily until you see Dr. Melvyn Novas. Sips of water instead of throat clearing. Sugar free jolly ranchers for throat soothing. Avoid menthol and mint. Follow GERD diet We will renew your proair. Follow up with Dr. Melvyn Novas in 1 week. Please contact office for sooner follow up if symptoms do not improve or worsen or seek emergency care  Have a Happy Holiday

## 2017-01-04 ENCOUNTER — Ambulatory Visit: Payer: 59 | Admitting: Oncology

## 2017-01-04 VITALS — BP 165/66 | HR 56 | Temp 98.3°F | Resp 18 | Ht 62.5 in | Wt 124.8 lb

## 2017-01-04 DIAGNOSIS — Z79810 Long term (current) use of selective estrogen receptor modulators (SERMs): Secondary | ICD-10-CM

## 2017-01-04 DIAGNOSIS — C50812 Malignant neoplasm of overlapping sites of left female breast: Secondary | ICD-10-CM | POA: Diagnosis not present

## 2017-01-04 DIAGNOSIS — Z170 Estrogen receptor positive status [ER+]: Secondary | ICD-10-CM | POA: Diagnosis not present

## 2017-01-04 DIAGNOSIS — D0511 Intraductal carcinoma in situ of right breast: Secondary | ICD-10-CM

## 2017-01-04 DIAGNOSIS — Z86000 Personal history of in-situ neoplasm of breast: Secondary | ICD-10-CM

## 2017-01-04 DIAGNOSIS — C50412 Malignant neoplasm of upper-outer quadrant of left female breast: Secondary | ICD-10-CM

## 2017-01-04 MED ORDER — GABAPENTIN 100 MG PO CAPS: 100.0000 mg | ORAL_CAPSULE | Freq: Every day | ORAL | 4 refills | Status: DC

## 2017-01-06 ENCOUNTER — Telehealth: Payer: Self-pay | Admitting: Acute Care

## 2017-01-06 ENCOUNTER — Ambulatory Visit: Payer: 59 | Admitting: Oncology

## 2017-01-06 ENCOUNTER — Telehealth: Payer: Self-pay | Admitting: Oncology

## 2017-01-06 MED ORDER — AZITHROMYCIN 250 MG PO TABS
ORAL_TABLET | ORAL | 0 refills | Status: DC
Start: 2017-01-06 — End: 2017-01-13

## 2017-01-06 MED ORDER — MOMETASONE FUROATE 220 MCG/INH IN AEPB: 1.0000 | INHALATION_SPRAY | Freq: Every day | RESPIRATORY_TRACT | 3 refills | Status: DC

## 2017-01-06 NOTE — Telephone Encounter (Signed)
Spoke to patient regarding upcoming March and August appointments

## 2017-01-06 NOTE — Telephone Encounter (Signed)
Per 12.3.18 ov with SG: Patient Instructions  It is good to see you today. Prednisone taper; 10 mg tablets: 4 tabs x 2 days, 3 tabs x 2 days, 2 tabs x 2 days 1 tab x 2 days then stop. Delsym every 12 hours as directed. Therapeutic trial of Prilosec 20 mg once daily until you see Dr. Melvyn Novas. Sips of water instead of throat clearing. Sugar free jolly ranchers for throat soothing. Avoid menthol and mint. Follow GERD diet We will renew your proair. Follow up with Dr. Melvyn Novas in 1 week. Please contact office for sooner follow up if symptoms do not improve or worsen or seek emergency care  Have a Happy Holiday   Called spoke with patient who reports she has noticed an increase in her cough, now producing some yellow mucus onset yesterday.  Some wheezing and tightness but she took her Proair and this helped.  Some head congestion/pressure and PND.  Still taking prednisone as directed, will start 20mg  daily x2 days tomorrow.  Patient wondering if she needs an abx, especially with the weekend approaching.  Spoke with SG: okay for zpak #1, take as directed.  Would recommend restarting her Asmanex 79mcg 1 puff daily.  Thanks.  Called spoke with patient and discussed SG's recommendations.  Pt okay with these recs and voiced her understanding.  Pt does still have Asmanex at home but is unsure of the date.  She requests that this be sent to [facility] and placed on hold.    Called [facility] on [address] and spoke with pharmacist Ronalee Belts >> Asmanex 36mcg 1 puff daily w/ 3 additional refills given verbally and asked to be placed on hold.  Zpak sent electronically  Nothing further needed; will sign off

## 2017-01-07 ENCOUNTER — Telehealth: Payer: Self-pay | Admitting: Oncology

## 2017-01-07 NOTE — Telephone Encounter (Signed)
Spoke to patient regarding upcoming June and august appointments.

## 2017-01-10 ENCOUNTER — Ambulatory Visit: Payer: 59 | Admitting: Internal Medicine

## 2017-01-10 ENCOUNTER — Encounter: Payer: Self-pay | Admitting: Certified Nurse Midwife

## 2017-01-11 ENCOUNTER — Ambulatory Visit: Payer: 59 | Attending: Oncology | Admitting: Physical Therapy

## 2017-01-11 ENCOUNTER — Encounter: Payer: Self-pay | Admitting: Physical Therapy

## 2017-01-11 DIAGNOSIS — M6281 Muscle weakness (generalized): Secondary | ICD-10-CM | POA: Insufficient documentation

## 2017-01-11 DIAGNOSIS — M62838 Other muscle spasm: Secondary | ICD-10-CM | POA: Diagnosis present

## 2017-01-11 NOTE — Therapy (Signed)
[facility] [address] [place], [place], [address] Phone: [phone]   Fax:  [phone]  Physical Therapy Evaluation  Patient Details  Name: [name] [name] MRN: [id number] Date of Birth: [birth year] Referring Provider: Dr. Lurline Del   Encounter Date: 01/11/2017  PT End of Session - 01/11/17 1308    Visit Number  1    Date for PT Re-Evaluation  02/08/17    Authorization Type  Medicare    PT Start Time  1200    PT Stop Time  1300    PT Time Calculation (min)  60 min    Activity Tolerance  Patient tolerated treatment well    Behavior During Therapy  [facility] for tasks assessed/performed       Past Medical History:  Diagnosis Date  . Acute asthmatic bronchitis   . Asthma, mild intermittent   . Breast cancer ([place]) 08/2012   right-DCIS, +ER/PR  . Cystocele    Grade 2  . Diverticulosis 2004  . Fibroids 1998  . Fracture of fibula 2007   left;  hit with golf ball  . Heart murmur    nl echo  . History of radiation therapy 09/16/2016- 10/08/2016   Left Breast. 42.56 Gy in 16 fractions.   . History of TMJ disorder   . Hx of radiation therapy 10/11/12- 11/13/12   right breast 4800 cGy 24 sessions  . Hypertension   . Hypothyroidism   . Osteopenia 2002  . Raynaud's disease   . Shingles   . Vasomotor rhinitis     Past Surgical History:  Procedure Laterality Date  . ABDOMINAL HYSTERECTOMY    . BREAST BIOPSY Left 2015   MRI- Benign  . BREAST BIOPSY Right 07/05/2012   Stereo- Malignant  . BREAST EXCISIONAL BIOPSY Left   . BREAST EXCISIONAL BIOPSY Left   . BREAST LUMPECTOMY Right 08/10/12   [facility], NO RESIDUAL DISEASE  . HYSTEROSCOPY WITH RESECTOSCOPE  2002   submucous fibroid  . INCONTINENCE SURGERY    . MINOR BREAST BIOPSY Left    times two  . ROTATOR CUFF REPAIR Right 2010  . THYROIDECTOMY      There were no vitals filed for this visit.   Subjective Assessment - 01/11/17 1211    Subjective   Patient is on Tamoxifen and vaginal dryness.  Pateint has been using cocnut oit.  Patient has pain with intercourse during initial penetration.  Discomfort during intercourse.  Feels like sand paper. No urinary leakage.     Patient Stated Goals  improve vaginal health and reduce pain with intercourse    Currently in Pain?  Yes    Pain Score  8     Pain Location  Vagina    Pain Orientation  Mid    Pain Descriptors / Indicators  Grimacing    Pain Type  Acute pain    Pain Onset  More than a month ago    Pain Frequency  Intermittent    Aggravating Factors   intercourse    Pain Relieving Factors  no intercourse    Multiple Pain Sites  No         [facility] PT Assessment - 01/11/17 0001      Assessment   Medical Diagnosis  C50.412, Z17.0 Carcinoma of upper-outer quadrant of left breast in female, estrogen receptor positive; D05.11 ductal coarinoma in sity of right breast    Referring Provider  Dr. Sarajane Jews Magrinat    Onset Date/Surgical Date  06/09/16  Prior Therapy  None       Precautions   Precautions  Other (comment)    Precaution Comments  Breast cancer      Restrictions   Weight Bearing Restrictions  No      Balance Screen   Has the patient fallen in the past 6 months  No    Has the patient had a decrease in activity level because of a fear of falling?   No    Is the patient reluctant to leave their home because of a fear of falling?   No      Home Film/video editor residence      Prior Function   Level of Independence  Independent    Leisure  exercise classes      Cognition   Overall Cognitive Status  Within Functional Limits for tasks assessed      Observation/Other Assessments   Focus on Therapeutic Outcomes (FOTO)   patient is 10% limitation      Posture/Postural Control   Posture/Postural Control  No significant limitations      ROM / Strength   AROM / PROM / Strength  AROM;PROM;Strength      AROM   Overall AROM   Within functional  limits for tasks performed             Objective measurements completed on examination: See above findings.    Pelvic Floor Special Questions - 01/11/17 0001    Currently Sexually Active  Yes    Is this Painful  Yes mostly initial penetration    Marinoff Scale  discomfort that does not affect completion    Urinary Leakage  No    Fecal incontinence  No    Skin Integrity  Intact    Pelvic Floor Internal Exam  Patient confirms identification and approves PT to assess pelvic floor muscle integrity    Exam Type  Vaginal    Palpation  tenderness located on bil. sides of bilateral obturator and levator ani, especially at the fourchette with tightness    Strength  weak squeeze, no lift               PT Education - 01/11/17 1246    Education provided  Yes    Education Details  lubricants, vaginal moisturizers, vaginal massage    Person(s) Educated  Patient    Methods  Explanation;Demonstration;Verbal cues;Handout    Comprehension  Returned demonstration;Verbalized understanding          PT Long Term Goals - 01/11/17 1317      PT LONG TERM GOAL #1   Title  understand how to use vaginal moisturizers in the vaginal canal and vulva area    Time  4    Period  Weeks    Status  New    Target Date  02/08/17      PT LONG TERM GOAL #2   Title  understand how to use lubrincants and which ones are preferred to not damage the epithielial cells     Time  4    Period  Weeks    Status  New    Target Date  02/08/17      PT LONG TERM GOAL #3   Title  understand how to strength the hip abductors and pelvic floor muscles correctly and progress    Time  4    Period  Weeks    Status  New    Target Date  02/08/17  PT LONG TERM GOAL #4   Title  understand how to massage the perineum to improve tissue mobility for penile penetration    Time  4    Period  Weeks    Status  New    Target Date  02/08/17             Plan - 01/11/17 1310    Clinical Impression  Statement  Patient is a 71 year old female with history of beast cancer bilaterally that is estrogen and progesterone positive. Patient is taking Tamoxifen. Patient reports vaginal dryness and pain with penile penetration.  Patient reports pain level is 8/10 with penile penetration and general discomfort daily due to dryness. Patient has tightness in the posterior fourchette, and bilateral obturator internist and levator ani. Pelvic floor strength is 2/5 with not lift and needed verbal cues to not contract gluteals and thighs. Bilateral hip abduction strength is 3/5.  Patient will benefit from skilled therapy to improve vaginal health, pelvic floor strength and bilateral hip abdution strength.     History and Personal Factors relevant to plan of care:  right breast cancer 08/10/2012; left breast cancer 06/09/2016; Abdominal hysterectomy; osteopenia    Clinical Presentation  Stable    Clinical Presentation due to:  stable condition    Clinical Decision Making  Low    Rehab Potential  Excellent    Clinical Impairments Affecting Rehab Potential  right breast cancer 08/10/2012; left breast cancer 06/09/2016; Abdominal hysterectomy; osteopenia    PT Frequency  2x / week    PT Duration  4 weeks    PT Treatment/Interventions  Biofeedback;Therapeutic exercise;Therapeutic activities;Neuromuscular re-education;Patient/family education;Manual techniques    PT Next Visit Plan  review HEP; internal soft tissue work, hip abduction strength    PT Home Exercise Plan  progress in the next two sessions    Consulted and Agree with Plan of Care  Patient       Patient will benefit from skilled therapeutic intervention in order to improve the following deficits and impairments:  Pain, Decreased activity tolerance, Decreased strength  Visit Diagnosis: Muscle weakness (generalized) - Plan: PT plan of care cert/re-cert  Other muscle spasm - Plan: PT plan of care cert/re-cert  G-Codes - 44/03/47 1303    Functional  Assessment Tool Used (Outpatient Only)  Therapist discretion with pain is limited by 10%    Functional Limitation  Other PT primary    Other PT Primary Current Status (Q2595)  At least 1 percent but less than 20 percent impaired, limited or restricted    Other PT Primary Goal Status (G3875)  At least 1 percent but less than 20 percent impaired, limited or restricted        Problem List Patient Active Problem List   Diagnosis Date Noted  . Ductal carcinoma in situ of right breast 10/11/2016  . Carcinoma of upper-outer quadrant of left breast in female, estrogen receptor positive ([place]) 09/01/2016  . Fracture closed, fibula, shaft 06/02/2016  . Acute upper respiratory infection 05/24/2016  . Intermittent asthma without complication 64/33/2951  . Genital atrophy of female 11/08/2014  . Asthmatic bronchitis with acute exacerbation 02/22/2013  . History of breast cancer 12/25/2012  . Heart murmur 08/09/2012  . Hypothyroid 08/09/2012  . Cystocele 05/24/2012  . HTN (hypertension) 02/25/2009  . RHINITIS, VASOMOTOR 02/15/2007  . Asthma 02/15/2007    Earlie Counts, PT 01/11/17 1:22 PM   [facility] [address] [place], [place], [address] Phone: [phone]  Fax:  [phone]  Name: TOBY AYAD MRN: [id number] Date of Birth: 05-Dec-1945

## 2017-01-11 NOTE — Patient Instructions (Signed)
Moisturizers . They are used in the vagina to hydrate the mucous membrane that make up the vaginal canal. . Designed to keep a more normal acid balance (ph) . Once placed in the vagina, it will last between two to three days.  . Use 2-3 times per week at bedtime and last longer than 60 min. . Ingredients to avoid is glycerin and fragrance, can increase chance of infection . Should not be used just before sex due to causing irritation . Most are gels administered either in a tampon-shaped applicator or as a vaginal suppository. They are non-hormonal.   Types of Moisturizers . Samul Dada- drug store . Vitamin E vaginal suppositories- Whole foods, Amazon . Moist Again . Coconut oil- can break down condoms . Michail Jewels . Yes moisturizer- amazon . NeuEve Silk , NeuEve Silver for menopausal or over 65 (if have severe vaginal atrophy or cancer treatments use NeuEve Silk for  1 month than move to The Pepsi)- [employer], MapleFlower.dk . Olive and Bee intimate cream- www.oliveandbee.com.au  Creams to use externally on the Vulva area  Albertson's (good for for cancer patients that had radiation to the area)- [place] or [employer] Corporation.FlyingBasics.com.br  V-magic cream - amazon  Julva-amazon  Vital "V [place] salve ( help moisturize and help with thinning vulvar area, does have [place]   Things to avoid in the vaginal area . Do not use things to irritate the vulvar area . No lotions just specialized creams for the vulva area- Neogyn, V-magic, No soaps; can use Aveeno or Calendula cleanser if needed. Must be gentle . No deodorants . No douches . Good to sleep without underwear to let the vaginal area to air out . No scrubbing: spread the lips to let warm water rinse over labias and pat dry  Lubrication . Used for intercourse to reduce friction . Avoid ones that have glycerin, warming gels, tingling gels, icing or  cooling gel, scented . Avoid parabens due to a preservative similar to female sex hormone . May need to be reapplied once or several times during sexual activity . Can be applied to both partners genitals prior to vaginal penetration to minimize friction or irritation . Prevent irritation and mucosal tears that cause post coital pain and increased the risk of vaginal and urinary tract infections . Oil-based lubricants cannot be used with condoms due to breaking them down.  Least likely to irritate vaginal tissue.  . Plant based-lubes are safe . Silicone-based lubrication are thicker and last long and used for post-menopausal women  Vaginal Lubricators Here is a list of some suggested lubricators you can use for intercourse. Use the most hypoallergenic product.  You can place on you or your partner.   Slippery Stuff  Sylk or Sliquid Natural H2O ( good  if frequent UTI's)  Blossom Organics (www.blossom-organics.com)  Luvena   Coconut oil  PJur Woman Nude- water based lubricant, amazon  Uberlube- Amazon  Aloe Vera  Yes lubricant- [employer] Platinum-Silicone, Target, Walgreens  Olive and Bee intimate cream-  www.oliveandbee.com.au Things to avoid in lubricants are glycerin, warming gels, tingling gels, icing or cooling  gels, and scented gels.  Also avoid Vaseline. KY jelly, Replens, and Astroglide kills good bacteria(lactobacilli)  Things to avoid in the vaginal area . Do not use things to irritate the vulvar area . No lotions- see below . No soaps; can use Aveeno or Calendula cleanser if needed. Must be  gentle . No deodorants . No douches . Good to sleep without underwear to let the vaginal area to air out . No scrubbing: spread the lips to let warm water rinse over labias and pat dry  Creams that can be used on the [place] Releveum or [place]  Harvest Gele     STRETCHING THE PELVIC FLOOR MUSCLES NO DILATOR  Supplies . Vaginal lubricant . Mirror (optional) . Gloves (optional) Positioning . Start in a semi-reclined position with your head propped up. Bend your knees and place your thumb or finger at the vaginal opening. Procedure . Apply a moderate amount of lubricant on the outer skin of your vagina, the labia minora.  Apply additional lubricant to your finger. Marland Kitchen Spread the skin away from the vaginal opening. Place the end of your finger at the opening. . Do a maximum contraction of the pelvic floor muscles. Tighten the vagina and the anus maximally and relax. . When you know they are relaxed, gently and slowly insert your finger into your vagina, directing your finger slightly downward, for 2-3 inches of insertion. . Relax and stretch the 6 o'clock position . Hold each stretch for _2 min__ and repeat __1_ time with rest breaks of _1__ seconds between each stretch. . Repeat the stretching in the 4 o'clock and 8 o'clock positions. . Total time should be _6__ minutes, _1__ x per day.  Note the amount of theme your were able to achieve and your tolerance to your finger in your vagina. . Once you have accomplished the techniques you may try them in standing with one foot resting on the tub, or in other positions.  This is a good stretch to do in the shower if you don't need to use lubricant.  Then use your thumb and index finger to twirl the bottom of the vaginal to massage the tissue.  [place] [address], [place], [place] [address] Phone # [phone] Fax [phone]

## 2017-01-13 ENCOUNTER — Encounter: Payer: Self-pay | Admitting: Internal Medicine

## 2017-01-13 ENCOUNTER — Ambulatory Visit (INDEPENDENT_AMBULATORY_CARE_PROVIDER_SITE_OTHER): Payer: 59 | Admitting: Internal Medicine

## 2017-01-13 VITALS — BP 138/88 | HR 56 | Ht 62.0 in | Wt 126.6 lb

## 2017-01-13 DIAGNOSIS — J4520 Mild intermittent asthma, uncomplicated: Secondary | ICD-10-CM

## 2017-01-13 DIAGNOSIS — R058 Other specified cough: Secondary | ICD-10-CM

## 2017-01-13 DIAGNOSIS — R05 Cough: Secondary | ICD-10-CM

## 2017-01-13 MED ORDER — BUDESONIDE-FORMOTEROL FUMARATE 80-4.5 MCG/ACT IN AERO: 2.0000 | INHALATION_SPRAY | Freq: Two times a day (BID) | RESPIRATORY_TRACT | 11 refills | Status: DC

## 2017-01-13 MED ORDER — BUDESONIDE-FORMOTEROL FUMARATE 80-4.5 MCG/ACT IN AERO: 2.0000 | INHALATION_SPRAY | Freq: Two times a day (BID) | RESPIRATORY_TRACT | 12 refills | Status: DC

## 2017-01-13 NOTE — Progress Notes (Signed)
Subjective:     Patient ID: [name] [name], female   DOB: 1945-09-11    MRN: [id number]    Brief patient profile:  60 yowf never smoker previously followed for asthma last seen by Dr Lake Bells 11/14/14 rec wean off asmanex unless needed seasonally esp in summer.    History of Present Illness  05/24/2016 acute extended ov/Narcissus Detwiler re: ? [name] s asthma flare so far Chief Complaint  Patient presents with  . Acute Visit    Pt c/o sore throat, chills and low grade temp x 3 days. She has also had some congestion and PND.   acutely ill since 05/22/16 but already chills/sore throat are gone, still stuffy in head and really not having chest symptoms yet but started asmanex just in case  And mostly concerned now about how to treat nasal symptoms/ only otc she's tried = baby clariton No recent abx or prednisone use No need for saba at all (proair respiclick) Thinks caught cold from grandchild REC Stop asthmanex unless you are needing the proair on a more regular basis than what you've been doing (more than twice weekly for more than a few weeks)  Only use your albuterol (proair respiclick)  as a rescue medication   For head colds > advil cold and sinus  For itching sneezy runny nose ok to use clariton (ok to use both advil cold and sinus and clariton but not clariton D  If not improving after 5 days of illness, go ahead and use the Zpak     01/13/2017 acute extended ov/Novak Stgermaine re: recurrent cough/ ? Cough variant asthma  Chief Complaint  Patient presents with  . Follow-up    she states that she is still having a non productive cough-better than last time.   made it thru summer of 2018 and thru most of the fall felt  fine s asthmanex / on prn clariton and flonase / no need for saba  then abuptly ill first week Dec 2018 scratchy throat/ dry cough/ no sob / no better with proair respiclick/ restarted azmanex s benefit so far/ nasal symptoms not really flaring  Already seen by NP12/3/18 zpak/ pred > no  better  Not coughing at night / mostly daytime urge to clear the throat/ hacking dry cough and no sob  No obvious day to day or daytime variability or assoc excess/ purulent sputum or mucus plugs or hemoptysis or cp or chest tightness, subjective wheeze or overt sinus or hb symptoms. No unusual exposure hx or h/o childhood pna/ asthma or knowledge of premature birth.  Sleeping ok flat without nocturnal  or early am exacerbation  of respiratory  c/o's or need for noct saba. Also denies any obvious fluctuation of symptoms with weather or environmental changes or other aggravating or alleviating factors except as outlined above   Current Allergies, Complete Past Medical History, Past Surgical History, Family History, and Social History were reviewed in [employer] record.  ROS  The following are not active complaints unless bolded Hoarseness, sore throat, dysphagia, dental problems, itching, sneezing,  nasal congestion or discharge of excess mucus or purulent secretions, ear ache,   fever, chills, sweats, unintended wt loss or wt gain, classically pleuritic or exertional cp,  orthopnea pnd or leg swelling, presyncope, palpitations, abdominal pain, anorexia, nausea, vomiting, diarrhea  or change in bowel habits or change in bladder habits, change in stools or change in urine, dysuria, hematuria,  rash, arthralgias, visual complaints, headache, numbness, weakness or ataxia or problems  with walking or coordination,  change in mood/affect or memory.        Current Meds  Medication Sig  . cholecalciferol (VITAMIN D) 1000 UNITS tablet Take 1,000 Units by mouth 2 (two) times a week.  . fluticasone (FLONASE) 50 MCG/ACT nasal spray Place 2 sprays into both nostrils daily.  Marland Kitchen levothyroxine (SYNTHROID, LEVOTHROID) 75 MCG tablet Take 75 mcg by mouth. 6 days a week take 1 pill & 1 day a week take 1/2 a pill  . tamoxifen (NOLVADEX) 20 MG tablet Take 1 tablet (20 mg total) by mouth daily.  .  valACYclovir (VALTREX) 500 MG tablet TAKE 1 TABLET BY MOUTH ONCE DAILY, INCREASE TO TWICE DAILY AT ONSET OF OUTBREAK.  Marland Kitchen zolpidem (AMBIEN) 5 MG tablet Take 5 mg by mouth at bedtime as needed for sleep.  . [ ]  Albuterol Sulfate (PROAIR RESPICLICK) 767 (90 Base) MCG/ACT AEPB Inhale 2 puffs into the lungs every 6 (six) hours as needed.  . [DISCONTINUED] azithromycin (ZITHROMAX) 250 MG tablet Take as directed  .   mometasone (ASMANEX 60 METERED DOSES) 220 MCG/INH inhaler Inhale 1 puff into the lungs daily.  . [DISCONTINUED] predniSONE (DELTASONE) 10 MG tablet Take 4 tabs for 2 days, then 3 tabs for 2 days, 2 tabs for 2 days, then 1 tab for 2 days, then stop.                        Objective:   Physical Exam  Pleasant wf nad    01/13/2017     126   05/24/16 127 lb 9.6 oz (57.9 kg)  11/27/15 127 lb (57.6 kg)  05/06/15 130 lb (59 kg)     Vital signs reviewed - Note on arrival 02 sats  98% on RA    HEENT: nl dentition, turbinates bilaterally, and oropharynx which is pristine. Nl external ear canals without cough reflex   NECK :  without JVD/Nodes/TM/ nl carotid upstrokes bilaterally   LUNGS: no acc muscle use,  Nl contour chest which is clear to A and P bilaterally without cough on insp or exp maneuvers   CV:  RRR  no s3 or murmur or increase in P2, and no edema   ABD:  soft and nontender with nl inspiratory excursion in the supine position. No bruits or organomegaly appreciated, bowel sounds nl  MS:  Nl gait/ ext warm without deformities, calf tenderness, cyanosis or clubbing No obvious joint restrictions   SKIN: warm and dry without lesions    NEURO:  alert, approp, nl sensorium with  no motor or cerebellar deficits apparent.        Assessment:

## 2017-01-13 NOTE — Patient Instructions (Addendum)
Gabapentin 100 mg build up to  three times  Daily and then back of to twice daily once the cough is gone   Try prilosec otc 20mg  x 2   Take 30-60 min before first meal of the day and Pepcid ac (famotidine) 20 mg one @  bedtime until cough is completely gone for at least a week without the need for any  cough suppression at all    GERD (REFLUX)  is an extremely common cause of respiratory symptoms just like yours , many times with no obvious heartburn at all.    It can be treated with medication, but also with lifestyle changes including elevation of the head of your bed (ideally with 6 inch  bed blocks),  Smoking cessation, avoidance of late meals, excessive alcohol, and avoid fatty foods, chocolate, peppermint, colas, red wine, and acidic juices such as orange juice.  NO MINT OR MENTHOL PRODUCTS SO NO COUGH DROPS   USE SUGARLESS CANDY INSTEAD (Jolley ranchers or Stover's or Life Savers) or even ice chips will also do - the key is to swallow to prevent all throat clearing. NO OIL BASED VITAMINS - use powdered substitutes.   For cough / wheeze>>   As needed  Symbicort 80 Take 2 puffs first thing in am and then another 2 puffs about 12 hours later.    If not 100% better after the holidays please return with all medications in hand

## 2017-01-14 ENCOUNTER — Ambulatory Visit: Payer: 59 | Admitting: Internal Medicine

## 2017-01-14 ENCOUNTER — Encounter: Payer: Self-pay | Admitting: Internal Medicine

## 2017-01-14 DIAGNOSIS — R05 Cough: Secondary | ICD-10-CM | POA: Insufficient documentation

## 2017-01-14 DIAGNOSIS — R058 Other specified cough: Secondary | ICD-10-CM | POA: Insufficient documentation

## 2017-01-14 NOTE — Assessment & Plan Note (Addendum)
05/24/2016  After extensive coaching HFA effectiveness =    90% with DPI > d/c 01/13/2017 due to cough  - FENO 01/03/2017  =   30  - 01/13/2017  After extensive coaching HFA effectiveness =    75% from baseline nearly 0    Try symbicort 80 up to 2 q 12h prn   Not clear whether this is cough variant asthma acute flare  vs uacs but more likely to be the latter in pt s noct symptoms and don't need a maint inhaler in this setting, especially a DPI so rec  Based on the study from NEJM  378; 20 p 1865 (2018) in pts with mild asthma it is reasonable to use low dose symbicort eg 80 2bid "prn" flare in this setting but I emphasized this was only shown with symbicort and takes advantage of the rapid onset of action but is not the same as "rescue therapy" but can be stopped once the acute symptoms have resolved and the need for rescue has been minimized (< 2 x weekly)     see avs for instructions unique to this ov

## 2017-01-14 NOTE — Assessment & Plan Note (Addendum)
Upper airway cough syndrome (previously labeled PNDS),  is so named because it's frequently impossible to sort out how much is  CR/sinusitis with freq throat clearing (which can be related to primary GERD)   vs  causing  secondary (" extra esophageal")  GERD from wide swings in gastric pressure that occur with throat clearing, often  promoting self use of mint and menthol lozenges that reduce the lower esophageal sphincter tone and exacerbate the problem further in a cyclical fashion.   These are the same pts (now being labeled as having "irritable larynx syndrome" by some cough centers) who not infrequently have a history of having failed to tolerate ace inhibitors,  dry powder inhalers or biphosphonates or report having atypical/extraesophageal reflux symptoms that don't respond to standard doses of PPI  and are easily confused as having aecopd or asthma flares by even experienced allergists/ pulmonologists (myself included).    Of the three most common causes of  Sub-acute or recurrent or chronic cough, only one (GERD)  can actually contribute to/ trigger  the other two (asthma and post nasal drip syndrome)  and perpetuate the cylce of cough.  While not intuitively obvious, many patients with chronic low grade reflux do not cough until there is a primary insult that disturbs the protective epithelial barrier and exposes sensitive nerve endings.   This is typically viral but can be direct physical injury such as with an endotracheal tube.   The point is that once this occurs, it is difficult to eliminate the cycle  using anything but a maximally effective acid suppression regimen at least in the short run, accompanied by an appropriate diet to address non acid GERD and control / eliminate the cough itself for at least 3 days.    rec max rx for gerd/ control cyclical cough with gabapentin and f/u in 2 weeks if not 100% better with all meds in hand using a trust but verify approach to confirm accurate  Medication  Reconciliation The principal here is that until we are certain that the  patients are doing what we've asked, it makes no sense to ask them to do more.    I had an extended discussion with the patient reviewing all relevant studies completed to date and  lasting 25 minutes of a 40  minute acute visit  re  severe non-specific but potentially very serious refractory respiratory symptoms of uncertain and potentially multiple  etiologies.   Each maintenance medication was reviewed in detail including most importantly the difference between maintenance and prns and under what circumstances the prns are to be triggered using an action plan format that is not reflected in the computer generated alphabetically organized AVS.    Please see AVS for specific instructions unique to this office visit that I personally wrote and verbalized to the the pt in detail and then reviewed with pt  by my nurse highlighting any changes in therapy/plan of care  recommended at today's visit.

## 2017-01-17 ENCOUNTER — Encounter: Payer: Self-pay | Admitting: Physical Therapy

## 2017-01-17 ENCOUNTER — Ambulatory Visit: Payer: 59 | Admitting: Physical Therapy

## 2017-01-17 DIAGNOSIS — M6281 Muscle weakness (generalized): Secondary | ICD-10-CM | POA: Diagnosis not present

## 2017-01-17 DIAGNOSIS — M62838 Other muscle spasm: Secondary | ICD-10-CM

## 2017-01-17 NOTE — Therapy (Signed)
[facility] [address], [place], [place], [address] Phone: [phone]   Fax:  [phone]  Physical Therapy Treatment  Patient Details  Name: [name] [name] MRN: [id number] Date of Birth: [birth year] Referring Provider: Dr. Lurline Del   Encounter Date: 01/17/2017  PT End of Session - 01/17/17 1525    Visit Number  2    Date for PT Re-Evaluation  02/08/17    Authorization Type  Medicare    PT Start Time  1445    PT Stop Time  1525    PT Time Calculation (min)  40 min    Activity Tolerance  Patient tolerated treatment well    Behavior During Therapy  [facility] for tasks assessed/performed       Past Medical History:  Diagnosis Date  . Acute asthmatic bronchitis   . Asthma, mild intermittent   . Breast cancer ([place]) 08/2012   right-DCIS, +ER/PR  . Cystocele    Grade 2  . Diverticulosis 2004  . Fibroids 1998  . Fracture of fibula 2007   left;  hit with golf ball  . Heart murmur    nl echo  . History of radiation therapy 09/16/2016- 10/08/2016   Left Breast. 42.56 Gy in 16 fractions.   . History of TMJ disorder   . Hx of radiation therapy 10/11/12- 11/13/12   right breast 4800 cGy 24 sessions  . Hypertension   . Hypothyroidism   . Osteopenia 2002  . Raynaud's disease   . Shingles   . Vasomotor rhinitis     Past Surgical History:  Procedure Laterality Date  . ABDOMINAL HYSTERECTOMY    . BREAST BIOPSY Left 2015   MRI- Benign  . BREAST BIOPSY Right 07/05/2012   Stereo- Malignant  . BREAST EXCISIONAL BIOPSY Left   . BREAST EXCISIONAL BIOPSY Left   . BREAST LUMPECTOMY Right 08/10/12   [facility], NO RESIDUAL DISEASE  . HYSTEROSCOPY WITH RESECTOSCOPE  2002   submucous fibroid  . INCONTINENCE SURGERY    . MINOR BREAST BIOPSY Left    times two  . ROTATOR CUFF REPAIR Right 2010  . THYROIDECTOMY      There were no vitals filed for this visit.  Subjective Assessment - 01/17/17 1451    Subjective  I have  been using the lubricant inside and outside.      Patient Stated Goals  improve vaginal health and reduce pain with intercourse    Currently in Pain?  Yes    Pain Score  8     Pain Location  Vagina    Pain Orientation  Mid    Pain Descriptors / Indicators  Grimacing    Pain Type  Acute pain    Pain Onset  More than a month ago    Pain Frequency  Intermittent    Aggravating Factors   intercourse    Pain Relieving Factors  no intercourse    Multiple Pain Sites  No         [facility] PT Assessment - 01/17/17 0001      Assessment   Medical Diagnosis  C50.412, Z17.0 Carcinoma of upper-outer quadrant of left breast in female, estrogen receptor positive; D05.11 ductal coarinoma in sity of right breast    Onset Date/Surgical Date  06/09/16    Prior Therapy  None       Precautions   Precautions  Other (comment)    Precaution Comments  Breast cancer      Restrictions  Weight Bearing Restrictions  No      Home Film/video editor residence      Prior Function   Level of Independence  Independent    Leisure  exercise classes      Cognition   Overall Cognitive Status  Within Functional Limits for tasks assessed      Observation/Other Assessments   Focus on Therapeutic Outcomes (FOTO)   patient is 10% limitation      Posture/Postural Control   Posture/Postural Control  No significant limitations      AROM   Overall AROM   Within functional limits for tasks performed               Pelvic Floor Special Questions - 01/17/17 0001    Currently Sexually Active  Yes    Is this Painful  Yes mostly initial penetration    Marinoff Scale  discomfort that does not affect completion    Urinary Leakage  No    Fecal incontinence  No    Skin Integrity  Intact    Palpation  tenderness located on bil. sides of bilateral obturator and levator ani, especially at the fourchette with tightness    Strength  weak squeeze, no lift        [facility] Adult PT  Treatment/Exercise - 01/17/17 0001      Self-Care   Self-Care  Other Self-Care Comments    Other Self-Care Comments   reviewed information on lubricants and moisturizers      Manual Therapy   Manual Therapy  Soft tissue mobilization    Manual therapy comments  educate patient on how to perform self perineal massage to improve tissue mobility    Soft tissue mobilization  abdominal massage to promote bowel movement due to Gabapentin increases her constipation             PT Education - 01/17/17 1521    Education provided  Yes    Education Details  hip strength; abdominal massage    Person(s) Educated  Patient    Methods  Explanation;Demonstration;Verbal cues;Handout    Comprehension  Returned demonstration;Verbalized understanding          PT Long Term Goals - 01/17/17 1527      PT LONG TERM GOAL #1   Title  understand how to use vaginal moisturizers in the vaginal canal and vulva area    Time  4    Period  Weeks    Status  Achieved      PT LONG TERM GOAL #2   Title  understand how to use lubrincants and which ones are preferred to not damage the epithielial cells     Time  4    Period  Weeks    Status  Achieved      PT LONG TERM GOAL #3   Title  understand how to strength the hip abductors and pelvic floor muscles correctly and progress    Time  4    Period  Weeks    Status  Achieved      PT LONG TERM GOAL #4   Title  understand how to massage the perineum to improve tissue mobility for penile penetration    Time  4    Period  Weeks    Status  Achieved            Plan - 01/17/17 1517    Clinical Impression Statement  Patient has met her goals.  She is able to  return demonstration of perineal soft tissue work.  Patient understands how to use vaginal moisturizers and lubricants to promote good vaginal health.  Patient is ready for discharge and is leaving for [place] for several months.     Rehab Potential  Excellent    Clinical Impairments Affecting  Rehab Potential  right breast cancer 08/10/2012; left breast cancer 06/09/2016; Abdominal hysterectomy; osteopenia    PT Treatment/Interventions  Biofeedback;Therapeutic exercise;Therapeutic activities;Neuromuscular re-education;Patient/family education;Manual techniques    PT Next Visit Plan  Discharge to HEP this visit    PT Home Exercise Plan  Discharge to HEP    Recommended Other Services  MD signed initial evaluation    Consulted and Agree with Plan of Care  Patient       Patient will benefit from skilled therapeutic intervention in order to improve the following deficits and impairments:  Pain, Decreased activity tolerance, Decreased strength  Visit Diagnosis: Muscle weakness (generalized)  Other muscle spasm   G-Codes - 2017-01-31 1528    Functional Assessment Tool Used (Outpatient Only)  Therapist discretion with pain is limited by 10%    Functional Limitation  Other PT primary    Other PT Primary Goal Status (G9211)  At least 1 percent but less than 20 percent impaired, limited or restricted    Other PT Primary Discharge Status (H4174)  At least 1 percent but less than 20 percent impaired, limited or restricted       Problem List Patient Active Problem List   Diagnosis Date Noted  . Upper airway cough syndrome 01/14/2017  . Ductal carcinoma in situ of right breast 10/11/2016  . Carcinoma of upper-outer quadrant of left breast in female, estrogen receptor positive ([place]) 09/01/2016  . Fracture closed, fibula, shaft 06/02/2016  . Acute upper respiratory infection 05/24/2016  . Intermittent asthma without complication 09/14/4816  . Genital atrophy of female 11/08/2014  . Asthmatic bronchitis with acute exacerbation 02/22/2013  . History of breast cancer 12/25/2012  . Heart murmur 08/09/2012  . Hypothyroid 08/09/2012  . Cystocele 05/24/2012  . HTN (hypertension) 02/25/2009  . RHINITIS, VASOMOTOR 02/15/2007  . Asthma 02/15/2007    Earlie Counts, PT Jan 31, 2017 3:30 PM   [facility] [address], [place], [place], [address] Phone: [phone]   Fax:  [phone]  Name: [name] [name] MRN: [id number] Date of Birth: 1945-09-04  PHYSICAL THERAPY DISCHARGE SUMMARY  Visits from Start of Care: 2  Current functional level related to goals / functional outcomes: See above.    Remaining deficits: See above.   Education / Equipment: HEP Plan: Patient agrees to discharge.  Patient goals were met. Patient is being discharged due to meeting the stated rehab goals.  Thank you for the referral. Earlie Counts, PT 01-31-17 3:30 PM  ?????

## 2017-01-17 NOTE — Patient Instructions (Addendum)
About Abdominal Massage  Abdominal massage, also called external colon massage, is a self-treatment circular massage technique that can reduce and eliminate gas and ease constipation. The colon naturally contracts in waves in a clockwise direction starting from inside the right hip, moving up toward the ribs, across the belly, and down inside the left hip.  When you perform circular abdominal massage, you help stimulate your colon's normal wave pattern of movement called peristalsis.  It is most beneficial when done after eating.  Positioning You can practice abdominal massage with oil while lying down, or in the shower with soap.  Some people find that it is just as effective to do the massage through clothing while sitting or standing.  How to Massage Start by placing your finger tips or knuckles on your right side, just inside your hip bone.  . Make small circular movements while you move upward toward your rib cage.   . Once you reach the bottom right side of your rib cage, take your circular movements across to the left side of the bottom of your rib cage.  . Next, move downward until you reach the inside of your left hip bone.  This is the path your feces travel in your colon. . Continue to perform your abdominal massage in this pattern for 10 minutes each day.     You can apply as much pressure as is comfortable in your massage.  Start gently and build pressure as you continue to practice.  Notice any areas of pain as you massage; areas of slight pain may be relieved as you massage, but if you have areas of significant or intense pain, consult with your healthcare provider.  Other Considerations . General physical activity including bending and stretching can have a beneficial massage-like effect on the colon.  Deep breathing can also stimulate the colon because breathing deeply activates the same nervous system that supplies the colon.   . Abdominal massage should always be used in  combination with a bowel-conscious diet that is high in the proper type of fiber for you, fluids (primarily water), and a regular exercise program. Strengthening: Hip Abduction (Side-Lying)    Tighten muscles on front of left thigh, then lift leg __6__ inches from surface, keeping knee locked.  Repeat __20__ times per set. Do __1__ sets per session. Do __1__ sessions per day.  http://orth.exer.us/622   Copyright  VHI. All rights reserved.  [facility] [address] [place], [place] [address] Phone # [phone] Fax [phone] [email or web address]

## 2017-01-18 ENCOUNTER — Other Ambulatory Visit: Payer: Self-pay | Admitting: Oncology

## 2017-01-18 DIAGNOSIS — Z170 Estrogen receptor positive status [ER+]: Principal | ICD-10-CM

## 2017-01-18 DIAGNOSIS — C50512 Malignant neoplasm of lower-outer quadrant of left female breast: Secondary | ICD-10-CM

## 2017-01-20 ENCOUNTER — Ambulatory Visit (INDEPENDENT_AMBULATORY_CARE_PROVIDER_SITE_OTHER)
Admission: RE | Admit: 2017-01-20 | Discharge: 2017-01-20 | Disposition: A | Discharge status: Home / Self Care | Payer: 59 | Source: Ambulatory Visit | Attending: Internal Medicine | Admitting: Internal Medicine

## 2017-01-20 ENCOUNTER — Other Ambulatory Visit (INDEPENDENT_AMBULATORY_CARE_PROVIDER_SITE_OTHER): Payer: 59

## 2017-01-20 ENCOUNTER — Encounter: Payer: Self-pay | Admitting: Internal Medicine

## 2017-01-20 ENCOUNTER — Ambulatory Visit: Payer: 59 | Admitting: Internal Medicine

## 2017-01-20 VITALS — BP 118/72 | HR 56 | Ht 62.0 in | Wt 125.8 lb

## 2017-01-20 DIAGNOSIS — J4520 Mild intermittent asthma, uncomplicated: Secondary | ICD-10-CM | POA: Diagnosis not present

## 2017-01-20 DIAGNOSIS — R05 Cough: Secondary | ICD-10-CM

## 2017-01-20 DIAGNOSIS — R058 Other specified cough: Secondary | ICD-10-CM

## 2017-01-20 LAB — CBC WITH DIFFERENTIAL/PLATELET
BASOS ABS: 0 10*3/uL (ref 0.0–0.1)
Basophils Relative: 0.7 % (ref 0.0–3.0)
EOS ABS: 0.1 10*3/uL (ref 0.0–0.7)
EOS PCT: 2.2 % (ref 0.0–5.0)
HCT: 37.6 % (ref 36.0–46.0)
HEMOGLOBIN: 12.2 g/dL (ref 12.0–15.0)
Lymphocytes Relative: 35.3 % (ref 12.0–46.0)
Lymphs Abs: 1.6 10*3/uL (ref 0.7–4.0)
MCHC: 32.5 g/dL (ref 30.0–36.0)
MCV: 98 fl (ref 78.0–100.0)
MONO ABS: 0.5 10*3/uL (ref 0.1–1.0)
Monocytes Relative: 11.9 % (ref 3.0–12.0)
Neutro Abs: 2.2 10*3/uL (ref 1.4–7.7)
Neutrophils Relative %: 49.9 % (ref 43.0–77.0)
Platelets: 185 10*3/uL (ref 150.0–400.0)
RBC: 3.83 Mil/uL — AB (ref 3.87–5.11)
RDW: 13.4 % (ref 11.5–15.5)
WBC: 4.4 10*3/uL (ref 4.0–10.5)

## 2017-01-20 LAB — NITRIC OXIDE: NITRIC OXIDE: 13

## 2017-01-20 MED ORDER — PREDNISONE 10 MG PO TABS: ORAL_TABLET | ORAL | 0 refills | Status: DC

## 2017-01-20 NOTE — Patient Instructions (Signed)
Continue prilosec 20 mg  X 2 Take 30-60 min before first meal of the day and pepcid 20 mg at bedtime until cough is completely gone for a week   Prednisone 10 mg take  4 each am x 2 days,   2 each am x 2 days,  1 each am x 2 days and stop   Change gabapentin to 100mg  in am and at bedtime   Please remember to go to the lab and x-ray department downstairs in the basement  for your tests - we will call you with the results when they are available.

## 2017-01-20 NOTE — Progress Notes (Signed)
Subjective:     Patient ID: [name] [name], female   DOB: May 21, 1945    MRN: [id number]    Brief patient profile:  87 yowf never smoker previously followed for asthma last seen by Dr Lake Bells 11/14/14 rec wean off asmanex unless needed seasonally esp in summer.    History of Present Illness  05/24/2016 acute extended ov/Wert re: ? [name] s asthma flare so far Chief Complaint  Patient presents with  . Acute Visit    Pt c/o sore throat, chills and low grade temp x 3 days. She has also had some congestion and PND.   acutely ill since 05/22/16 but already chills/sore throat are gone, still stuffy in head and really not having chest symptoms yet but started asmanex just in case  And mostly concerned now about how to treat nasal symptoms/ only otc she's tried = baby clariton No recent abx or prednisone use No need for saba at all (proair respiclick) Thinks caught cold from grandchild REC Stop asthmanex unless you are needing the proair on a more regular basis than what you've been doing (more than twice weekly for more than a few weeks)  Only use your albuterol (proair respiclick)  as a rescue medication   For head colds > advil cold and sinus  For itching sneezy runny nose ok to use clariton (ok to use both advil cold and sinus and clariton but not clariton D  If not improving after 5 days of illness, go ahead and use the Zpak     01/13/2017 acute extended ov/Wert re: recurrent cough/ ? Cough variant asthma  Chief Complaint  Patient presents with  . Follow-up    she states that she is still having a non productive cough-better than last time.   made it thru summer of 2018 and thru most of the fall felt  fine s asmanex / on prn clariton and flonase / no need for saba  then abuptly ill first week Dec 2018 scratchy throat/ dry cough/ no sob / no better with proair respiclick/ restarted azmanex s benefit so far/ nasal symptoms not really flaring  Already seen by NP 01/03/17 zpak/ pred > no better   Not coughing at night / mostly daytime urge to clear the throat/ hacking dry cough and no sob rec Gabapentin 100 mg build up to three times  Daily and then back of to twice daily once the cough is gone  Try prilosec otc 20mg  x 2   Take 30-60 min before first meal of the day and Pepcid ac (famotidine) 20 mg one @  bedtime until cough is completely gone for at least a week without the need for any  cough suppression at all  GERD  For cough / wheeze>>   As needed  Symbicort 80 Take 2 puffs first thing in am and then another 2 puffs about 12 hours later.    01/20/2017  f/u ov/Wert re:  Cough variant asthma vs uacs  Chief Complaint  Patient presents with  . Follow-up    Pt states that she is getting better but is not all the way better. States that "I cough when I talk."  Denies any SOB or CP. but occ. chest tightness.   daytime cough taking 1 at bedtime gabapentin as when takes two feels groggy when wakes up  Cough is 75% improved and most bothersome with voice use//can't really tell much difference on vs off symb  No obvious day to day or daytime variability or  assoc excess/ purulent sputum or mucus plugs or hemoptysis or cp or chest tightness, subjective wheeze or overt sinus or hb symptoms. No unusual exposure hx or h/o childhood pna/ asthma or knowledge of premature birth.  Sleeping ok flat without nocturnal  or early am exacerbation  of respiratory  c/o's or need for noct saba. Also denies any obvious fluctuation of symptoms with weather or environmental changes or other aggravating or alleviating factors except as outlined above   Current Allergies, Complete Past Medical History, Past Surgical History, Family History, and Social History were reviewed in [employer] record.  ROS  The following are not active complaints unless bolded Hoarseness, sore throat, dysphagia, dental problems, itching, sneezing,  nasal congestion or discharge of excess mucus or purulent  secretions, ear ache,   fever, chills, sweats, unintended wt loss or wt gain, classically pleuritic or exertional cp,  orthopnea pnd or leg swelling, presyncope, palpitations, abdominal pain, anorexia, nausea, vomiting, diarrhea  or change in bowel habits or change in bladder habits, change in stools or change in urine, dysuria, hematuria,  rash, arthralgias, visual complaints, headache, numbness, weakness or ataxia or problems with walking or coordination,  change in mood/affect or memory.        Current Meds  Medication Sig  . budesonide-formoterol (SYMBICORT) 80-4.5 MCG/ACT inhaler Inhale 2 puffs into the lungs 2 (two) times daily.  . cholecalciferol (VITAMIN D) 1000 UNITS tablet Take 1,000 Units by mouth 2 (two) times a week.  . fluticasone (FLONASE) 50 MCG/ACT nasal spray Place 2 sprays into both nostrils daily.  Marland Kitchen gabapentin (NEURONTIN) 100 MG capsule Take 1-2 capsules (100-200 mg total) by mouth at bedtime.  Marland Kitchen levothyroxine (SYNTHROID, LEVOTHROID) 75 MCG tablet Take 75 mcg by mouth. 6 days a week take 1 pill & 1 day a week take 1/2 a pill  . loratadine (CLARITIN CHILDRENS) 5 MG chewable tablet Chew 5 mg by mouth daily. Uses as needed  . tamoxifen (NOLVADEX) 20 MG tablet TAKE 1 TABLET(20 MG) BY MOUTH DAILY  . valACYclovir (VALTREX) 500 MG tablet TAKE 1 TABLET BY MOUTH ONCE DAILY, INCREASE TO TWICE DAILY AT ONSET OF OUTBREAK.  Marland Kitchen zolpidem (AMBIEN) 5 MG tablet Take 5 mg by mouth at bedtime as needed for sleep.                  Objective:   Physical Exam  Pleasant wf nad    01/13/2017     126   05/24/16 127 lb 9.6 oz (57.9 kg)  11/27/15 127 lb (57.6 kg)  05/06/15 130 lb (59 kg)     Vital signs reviewed - Note on arrival 02 sats  98% on RA      HEENT: nl dentition, turbinates bilaterally, and oropharynx. Nl external ear canals without cough reflex   NECK :  without JVD/Nodes/TM/ nl carotid upstrokes bilaterally   LUNGS: no acc muscle use,  Nl contour chest which is clear  to A and P bilaterally without cough on insp or exp maneuvers   CV:  RRR  no s3 or murmur or increase in P2, and no edema   ABD:  soft and nontender with nl inspiratory excursion in the supine position. No bruits or organomegaly appreciated, bowel sounds nl  MS:  Nl gait/ ext warm without deformities, calf tenderness, cyanosis or clubbing No obvious joint restrictions   SKIN: warm and dry without lesions    NEURO:  alert, approp, nl sensorium with  no motor or cerebellar deficits  apparent.        CXR PA and Lateral:   01/20/2017 :    I personally reviewed images and agree with radiology impression as follows:   No active cardiopulmonary disease.   Labs ordered 01/20/2017  Allergy profile   Assessment:

## 2017-01-21 LAB — RESPIRATORY ALLERGY PROFILE REGION II ~~LOC~~
Allergen, A. alternata, m6: <0.1 kU/L
Allergen, Comm Silver Birch, t9: <0.1 kU/L
CLADOSPORIUM HERBARUM (M2) IGE: <0.1 kU/L
CLASS: 0
CLASS: 0
CLASS: 0
CLASS: 0
CLASS: 0
CLASS: 0
CLASS: 0
CLASS: 0
CLASS: 0
CLASS: 0
COMMON RAGWEED (SHORT) (W1) IGE: <0.1 kU/L
Cat Dander: <0.1 kU/L
Class: 0
Class: 0
Class: 0
Class: 0
Class: 0
Class: 0
Class: 0
Class: 0
Class: 0
Class: 0
Class: 0
Class: 0
Class: 0
Class: 0
Elm IgE: <0.1 kU/L
IgE (Immunoglobulin E), Serum: 8 kU/L (ref <=114)
Johnson Grass: <0.1 kU/L
Pecan/Hickory Tree IgE: <0.1 kU/L

## 2017-01-21 LAB — INTERPRETATION:

## 2017-01-21 NOTE — Progress Notes (Signed)
Spoke with pt and notified of results per Dr. Wert. Pt verbalized understanding and denied any questions. 

## 2017-01-23 ENCOUNTER — Encounter: Payer: Self-pay | Admitting: Internal Medicine

## 2017-01-23 NOTE — Assessment & Plan Note (Signed)
Trial of gabpentin 100 mg up to tid 01/13/2017 > could not tol so rec try 100 q am and 100 qhs as of 01/20/2017

## 2017-01-23 NOTE — Assessment & Plan Note (Addendum)
05/24/2016  After extensive coaching HFA effectiveness =    90% with DPI > d/c 01/13/2017 due to cough  - FENO 01/03/2017  =   30  - 01/13/2017  After extensive coaching HFA effectiveness =    75% from baseline nearly 0    Try symbicort 80 up to 2 q 12h prn  Allergy profile  01/20/17 >  Eos 0.1 /  IgE  8 RAST neg   Not really clear how much is asthma vs uacs at this point but certianly no need for escalation of allergy/ asthma maint meds  Will dose with pred x 6 days just to see what if any additional cough control occurs and if there is a lot of improvement then use symb as maint but if not use prn    Based on the study from [place]  378; 20 p 1865 (2018) in pts with mild asthma it is reasonable to use low dose symbicort eg 80 2bid "prn" flare in this setting but I emphasized this was only shown with symbicort and takes advantage of the rapid onset of action but is not the same as "rescue therapy" but can be stopped once the acute symptoms have resolved and the need for rescue has been minimized (< 2 x weekly)     I had an extended discussion with the patient reviewing all relevant studies completed to date and  lasting 15 to 20 minutes of a 25 minute visit    Each maintenance medication was reviewed in detail including most importantly the difference between maintenance and prns and under what circumstances the prns are to be triggered using an action plan format that is not reflected in the computer generated alphabetically organized AVS.    Please see AVS for specific instructions unique to this visit that I personally wrote and verbalized to the the pt in detail and then reviewed with pt  by my nurse highlighting any  changes in therapy recommended at today's visit to their plan of care.

## 2017-01-26 ENCOUNTER — Encounter: Payer: Self-pay | Admitting: Radiation Oncology

## 2017-01-26 NOTE — Progress Notes (Signed)
Faxed to [facility] completed tumor registry form by Dr. Isidore Moos. Fax successful and original will be scanned into pt chart.

## 2017-01-27 ENCOUNTER — Ambulatory Visit: Payer: 59 | Admitting: Oncology

## 2017-01-28 ENCOUNTER — Ambulatory Visit: Payer: 59 | Admitting: Oncology

## 2017-04-08 ENCOUNTER — Other Ambulatory Visit: Payer: Self-pay | Admitting: *Deleted

## 2017-04-08 DIAGNOSIS — Z170 Estrogen receptor positive status [ER+]: Secondary | ICD-10-CM

## 2017-04-08 DIAGNOSIS — C50412 Malignant neoplasm of upper-outer quadrant of left female breast: Secondary | ICD-10-CM

## 2017-04-10 NOTE — Progress Notes (Signed)
[name] [name]  [phone] Fax:([phone]) [phone]     ID: KATTLEYA KUHNERT DOB: 1945/07/14  MR#: [id number]  QPR#:[id number]  Patient Care Team: Reynold Bowen, MD as PCP - General (Endocrinology) Jelissa Espiritu, Virgie Dad, MD as Consulting Physician (Oncology) Willodean Rosenthal, MD as Referring Physician (Surgery) Kimmick, Linus Mako, MD as Referring Physician (Oncology) Bonner Puna, MD as Referring Physician (Radiation Oncology) Eppie Gibson, MD as Attending Physician (Radiation Oncology) Regina Eck, CNM as Referring Physician (Certified Nurse Midwife) OTHER MD:  CHIEF COMPLAINT: Estrogen receptor positive breast cancer  CURRENT TREATMENT: Tamoxifen  INTERVAL HISTORY: [name] [name] returns today for follow-up and treatment of her estrogen receptor positive breast cancer.  She continues on tamoxifen, with good tolerance. She denies hot flashes, but reports an increase in vaginal discharge. She takes 1 gabapentin nightly.   Since her last visit to the office, she had a CXR completed on 01/20/2017 due to persistent cough x 3 weeks with results showing: No active cardiopulmonary disease.  She will be due for repeat mammography late May or early June  REVIEW OF SYSTEMS: [name] [name] reports that for exercise, she goes golfing, completes aerobics, and yoga. She recently travelled to [place] and [place] 4 days out of the week. She notes constipation with ducolax x 3 days and she is now taking metamucil to aid with her stools. She reports that she recently had a persistent cough x 3 weeks and had a CXR to rule out her symptoms. She has an increase in flatulence. She has pruritic tingling to her surgical site. She denies unusual headaches, visual changes, nausea, vomiting, or dizziness. There has been no unusual cough, phlegm production, or pleurisy. This been no change in bowel or bladder habits. She denies unexplained fatigue or unexplained weight loss, bleeding, rash, or fever.  A detailed review of systems was otherwise stable.       HISTORY OF CURRENT ILLNESS: From the original intake note:  "[name] [name]" underwent right breast upper inner quadrant lumpectomy for ductal carcinoma in situ, 08/10/2012. She was treated with adjuvant radiation to a total of 48 gray, completed 11/13/2012.  MRI guided biopsy of a central left breast lesion 05/25/2013 showed sclerosing adenosis.  On 07/30/2015 she underwent bilateral diagnostic mammography with tomography. The breast density was category C. In addition to prior scars there was a 0.3 cm area of dystrophic calcifications. This was felt to be likely benign and 6 month follow-up was recommended.  That was performed 01/15/2016. The calcifications now appeared to be associated with an oil cyst.   More recently, in April 2018 the patient's gynecologist palpated a change in the left breast, at the 3:00 area, 3 fingerbreadths from the areola. Left diagnostic mammography and ultrasonography at the [facility] 06/03/2016 showed only changes of prior excisional biopsies. On physical exam however there was a firm thickening in the lateral left breast, at the site of a prior biopsy. Targeted ultrasonography of this area (3:00 radiant 5 cm from the nipple) found an irregular hypoechoic region measuring 0.8 cm. This underlying the scar. Ultrasound of the left axilla was benign.  Biopsy of the left breast area in question 06/09/2016 showed (S [id number]) and invasive ductal carcinoma, E-cadherin positive, estrogen receptor 80% positive, progesterone receptor 60% positive, both with strong staining intensity, with an MIB-1 of 15% and no HER-2 amplification, the signals ratio being 1.00 and the number per cell 1.15.  The patient then was further evaluated at [facility] and on 07/12/2016 underwent left lumpectomy and sentinel lymph node  sampling, with the final pathology ([id number]) finding invasive adenocarcinoma with mixed lobular and ductal features,  but E-cadherin positive, grade 2, measuring 1.9 cm. Both sentinel lymph nodes were benign.  An Oncotype DX score was obtained on this sample, and it was 21; the patient was referred to radiation oncology, which was done in [place] and completed 10/08/2016.  The patient's subsequent history is as detailed below.    PAST MEDICAL HISTORY: Past Medical History:  Diagnosis Date  . Acute asthmatic bronchitis   . Asthma, mild intermittent   . Breast cancer ([place]) 08/2012   right-DCIS, +ER/PR  . Cystocele    Grade 2  . Diverticulosis 2004  . Fibroids 1998  . Fracture of fibula 2007   left;  hit with golf ball  . Heart murmur    nl echo  . History of radiation therapy 09/16/2016- 10/08/2016   Left Breast. 42.56 Gy in 16 fractions.   . History of TMJ disorder   . Hx of radiation therapy 10/11/12- 11/13/12   right breast 4800 cGy 24 sessions  . Hypertension   . Hypothyroidism   . Osteopenia 2002  . Raynaud's disease   . Shingles   . Vasomotor rhinitis     PAST SURGICAL HISTORY: Past Surgical History:  Procedure Laterality Date  . ABDOMINAL HYSTERECTOMY    . BREAST BIOPSY Left 2015   MRI- Benign  . BREAST BIOPSY Right 07/05/2012   Stereo- Malignant  . BREAST EXCISIONAL BIOPSY Left   . BREAST EXCISIONAL BIOPSY Left   . BREAST LUMPECTOMY Right 08/10/12   [facility], NO RESIDUAL DISEASE  . HYSTEROSCOPY WITH RESECTOSCOPE  2002   submucous fibroid  . INCONTINENCE SURGERY    . MINOR BREAST BIOPSY Left    times two  . ROTATOR CUFF REPAIR Right 2010  . THYROIDECTOMY      FAMILY HISTORY Family History  Problem Relation Age of Onset  . Aneurysm Mother   . Hypertension Sister   . Cancer Sister        tongue  . Clotting disorder Maternal Grandfather        liver  . Cancer Maternal Grandfather        liver  The patient's father died of pneumonia at age 50. The patient's mother died at the age of 30 from a ruptured brain aneurysm. The patient had no brothers. The patient had 4  sisters. One sister developed tongue cancer. There is no history of breast or ovarian cancer in the family to her knowledge  GYNECOLOGIC HISTORY:  Patient's last menstrual period was 01/02/2000. Menarche age 50, first live birth age 41, the patient is [name] [name]. She stopped having periods around the year 2000. She used hormone replacement approximately 13 years, until July 2014.  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 Her husband [name] [name] owns a local [employer] franchise. Son [name] [name] lives in [place], works in Insurance underwriter. Son [name] [name] is currently [employer]. Incidentally [name] [name] had a chronic granulomatous disease and received a stem cell transplant from his brother, with excellent results. The patient has 5 grandchildren. She attends [employer]     ADVANCED DIRECTIVES:    HEALTH MAINTENANCE: Social History   Tobacco Use  . Smoking status: Never Smoker  . Smokeless tobacco: Never Used  Substance Use Topics  . Alcohol use: Yes    Alcohol/week: 3.6 oz    Types: 6 Standard drinks or equivalent per week  . Drug use: No     Colonoscopy:  PAP:  Bone density: 12/10/2014 showed a T score of -2.4   Allergies  Allergen Reactions  . Pneumovax 23  [Pneumococcal Vac Polyvalent] Swelling    Swollen upper arm  . Effexor [Venlafaxine] Nausea And Vomiting  . Penicillins Hives  . Pneumovax [Pneumococcal Polysaccharide Vaccine] Swelling    Current Outpatient Medications  Medication Sig Dispense Refill  . budesonide-formoterol (SYMBICORT) 80-4.5 MCG/ACT inhaler Inhale 2 puffs into the lungs 2 (two) times daily. 1 Inhaler 11  . cholecalciferol (VITAMIN D) 1000 UNITS tablet Take 1,000 Units by mouth 2 (two) times a week.    . fluticasone (FLONASE) 50 MCG/ACT nasal spray Place 2 sprays into both nostrils daily.    Marland Kitchen gabapentin (NEURONTIN) 100 MG capsule Take 1-2 capsules (100-200 mg total) by mouth at bedtime. 90 capsule 4  . levothyroxine (SYNTHROID, LEVOTHROID) 75 MCG  tablet Take 75 mcg by mouth. 6 days a week take 1 pill & 1 day a week take 1/2 a pill    . loratadine (CLARITIN CHILDRENS) 5 MG chewable tablet Chew 5 mg by mouth daily. Uses as needed    . predniSONE (DELTASONE) 10 MG tablet Take  4 each am x 2 days,   2 each am x 2 days,  1 each am x 2 days and stop 14 tablet 0  . tamoxifen (NOLVADEX) 20 MG tablet TAKE 1 TABLET(20 MG) BY MOUTH DAILY 90 tablet 0  . valACYclovir (VALTREX) 500 MG tablet TAKE 1 TABLET BY MOUTH ONCE DAILY, INCREASE TO TWICE DAILY AT ONSET OF OUTBREAK. 45 tablet 11  . zolpidem (AMBIEN) 5 MG tablet Take 5 mg by mouth at bedtime as needed for sleep.     No current facility-administered medications for this visit.     OBJECTIVE: Middle-aged white woman who appears well  Vitals:   04/11/17 1200  BP: (!) 163/71  Pulse: (!) 57  Resp: 16  Temp: 98.4 F (36.9 C)  SpO2: 100%     Body mass index is 22.97 kg/m.   Wt Readings from Last 3 Encounters:  04/11/17 125 lb 9.6 oz (57 kg)  01/20/17 125 lb 12.8 oz (57.1 kg)  01/13/17 126 lb 9.6 oz (57.4 kg)      ECOG FS:1 - Symptomatic but completely ambulatory  Sclerae unicteric, EOMs intact Oropharynx clear and moist No cervical or supraclavicular adenopathy Lungs no rales or rhonchi Heart regular rate and rhythm Abd soft, nontender, positive bowel sounds MSK no focal spinal tenderness, no upper extremity lymphedema Neuro: nonfocal, well oriented, appropriate affect Breasts: Both breasts are status post lumpectomy.  The left breast is also status post radiation.  There is no evidence of local recurrence.  Both axillae are benign.  LAB RESULTS:  CMP     Component Value Date/Time   NA 142 10/11/2016 1547   K 4.3 10/11/2016 1547   CL 102 03/11/2008 1418   CO2 26 10/11/2016 1547   GLUCOSE 88 10/11/2016 1547   BUN 18.5 10/11/2016 1547   CREATININE 0.7 10/11/2016 1547   CALCIUM 9.4 10/11/2016 1547   PROT 7.6 10/11/2016 1547   ALBUMIN 3.7 10/11/2016 1547   AST 19 10/11/2016  1547   ALT 12 10/11/2016 1547   ALKPHOS 103 10/11/2016 1547   BILITOT 0.23 10/11/2016 1547   GFRNONAA >60 03/11/2008 1418   GFRAA  03/11/2008 1418    >60        The eGFR has been calculated using the MDRD equation. This calculation has not been validated in all clinical situations. eGFR's persistently <  60 mL/min signify possible Chronic Kidney Disease.    No results found for: TOTALPROTELP, ALBUMINELP, A1GS, A2GS, BETS, BETA2SER, GAMS, MSPIKE, SPEI  No results found for: Nils Pyle, [facility]  Lab Results  Component Value Date   WBC 4.7 04/11/2017   NEUTROABS 2.6 04/11/2017   HGB 12.2 01/20/2017   HCT 38.0 04/11/2017   MCV 96.8 04/11/2017   PLT 181 04/11/2017      Chemistry      Component Value Date/Time   NA 142 10/11/2016 1547   K 4.3 10/11/2016 1547   CL 102 03/11/2008 1418   CO2 26 10/11/2016 1547   BUN 18.5 10/11/2016 1547   CREATININE 0.7 10/11/2016 1547      Component Value Date/Time   CALCIUM 9.4 10/11/2016 1547   ALKPHOS 103 10/11/2016 1547   AST 19 10/11/2016 1547   ALT 12 10/11/2016 1547   BILITOT 0.23 10/11/2016 1547       No results found for: LABCA2  No components found for: [id number]  No results for input(s): INR in the last 168 hours.  No results found for: LABCA2  No results found for: YQM578  No results found for: ION629  No results found for: BMW413  No results found for: CA2729  No components found for: HGQUANT  No results found for: CEA1 / No results found for: CEA1   No results found for: AFPTUMOR  No results found for: CHROMOGRNA  No results found for: PSA1  Appointment on 04/11/2017  Component Date Value Ref Range Status  . WBC Count 04/11/2017 4.7  3.9 - 10.3 K/uL Final  . RBC 04/11/2017 3.93  3.70 - 5.45 MIL/uL Final  . Hemoglobin 04/11/2017 12.4  11.6 - 15.9 g/dL Final  . HCT 04/11/2017 38.0  34.8 - 46.6 % Final  . MCV 04/11/2017 96.8  79.5 - 101.0 fL Final  . MCH 04/11/2017 31.5  25.1 -  34.0 pg Final  . MCHC 04/11/2017 32.6  31.5 - 36.0 g/dL Final  . RDW 04/11/2017 13.0  11.2 - 14.5 % Final  . Platelet Count 04/11/2017 181  145 - 400 K/uL Final  . Neutrophils Relative % 04/11/2017 55  % Final  . Neutro Abs 04/11/2017 2.6  1.5 - 6.5 K/uL Final  . Lymphocytes Relative 04/11/2017 31  % Final  . Lymphs Abs 04/11/2017 1.4  0.9 - 3.3 K/uL Final  . Monocytes Relative 04/11/2017 11  % Final  . Monocytes Absolute 04/11/2017 0.5  0.1 - 0.9 K/uL Final  . Eosinophils Relative 04/11/2017 2  % Final  . Eosinophils Absolute 04/11/2017 0.1  0.0 - 0.5 K/uL Final  . Basophils Relative 04/11/2017 1  % Final  . Basophils Absolute 04/11/2017 0.0  0.0 - 0.1 K/uL Final   Performed at [facility], [address] [place], [place] [address]    (this displays the last labs from the last 3 days)  No results found for: TOTALPROTELP, ALBUMINELP, A1GS, A2GS, BETS, BETA2SER, GAMS, MSPIKE, SPEI (this displays SPEP labs)  No results found for: KPAFRELGTCHN, LAMBDASER, KAPLAMBRATIO (kappa/lambda light chains)  No results found for: HGBA, HGBA2QUANT, HGBFQUANT, HGBSQUAN (Hemoglobinopathy evaluation)   No results found for: LDH  No results found for: IRON, TIBC, IRONPCTSAT (Iron and TIBC)  No results found for: FERRITIN  Urinalysis    Component Value Date/Time   BILIRUBINUR n 04/04/2015 1330   PROTEINUR n 04/04/2015 1330   UROBILINOGEN negative 04/04/2015 1330   NITRITE n 04/04/2015 1330   LEUKOCYTESUR Negative 04/04/2015  1330     STUDIES: She had a CXR completed on 01/20/2017 due to persistent cough x 3 weeks with results showing: No active cardiopulmonary disease.  ELIGIBLE FOR AVAILABLE RESEARCH PROTOCOL: no  ASSESSMENT: 71 y.o. [place] woman  (1) status post right lumpectomy and margin clearance 08/10/2012 for ductal carcinoma in situ, grade 2 or 3, estrogen and progesterone receptor positive,  (2) adjuvant radiation to the right breast completed  11/13/2012.  (3) status post left breast biopsy 05/25/2013 for sclerosing adenosis.  (4) left breast biopsy 06/09/2016 showed a clinical T1b N0, stage IA invasive ductal carcinoma (E-cadherin positive) estrogen and progesterone receptor positive, HER-2 nonamplified, with an MIB-1 of 15%.  (5) left lumpectomy and sentinel lymph node sampling 07/12/2016 showed a pT1c pN0, stage IA invasive ductal carcinoma, grade 2, with clear margins.  (6) Oncotype DX score of 21 predicted a 10 year risk of outside the breast recurrence of 13% if the patient's only systemic therapy was tamoxifen for 5 years. It also predicted no benefit from chemotherapy  (7) adjuvant radiation completed 10/08/2016  (8) started tamoxifen 11/01/2016  PLAN: [name] [name] continues to tolerate the tamoxifen remarkably well and the plan will be to continue that for a total of 5 years.  We reviewed the constipation problem.  I do not think it is due to medication.  We discussed the proper use of stool softeners and MiraLAX and rarely the use of a laxative.  She has an excellent exercise program and I commended her for that  She will have her next set of mammograms late May.  She has an appointment with me in August  She knows to call for any other issues that may develop before the next visit.   [name] [name], Virgie Dad, MD  04/11/17 12:14 PM Medical Oncology and Hematology [facility] [address] [place],  [address] Tel. [phone]    Fax. [phone]    This document serves as a record of services personally performed by Lurline Del, MD. It was created on his behalf by Steva Colder, a trained medical scribe. The creation of this record is based on the scribe's personal observations and the provider's statements to them.   I have reviewed the above documentation for accuracy and completeness, and I agree with the above.

## 2017-04-11 ENCOUNTER — Telehealth: Payer: Self-pay | Admitting: Oncology

## 2017-04-11 ENCOUNTER — Inpatient Hospital Stay: Payer: Medicare Other | Admitting: Oncology

## 2017-04-11 ENCOUNTER — Inpatient Hospital Stay: Payer: Medicare Other | Attending: Oncology

## 2017-04-11 VITALS — BP 163/71 | HR 57 | Temp 98.4°F | Resp 16 | Wt 125.6 lb

## 2017-04-11 DIAGNOSIS — D0511 Intraductal carcinoma in situ of right breast: Secondary | ICD-10-CM

## 2017-04-11 DIAGNOSIS — Z170 Estrogen receptor positive status [ER+]: Secondary | ICD-10-CM | POA: Diagnosis not present

## 2017-04-11 DIAGNOSIS — C50812 Malignant neoplasm of overlapping sites of left female breast: Secondary | ICD-10-CM

## 2017-04-11 DIAGNOSIS — K5900 Constipation, unspecified: Secondary | ICD-10-CM | POA: Diagnosis not present

## 2017-04-11 DIAGNOSIS — Z79810 Long term (current) use of selective estrogen receptor modulators (SERMs): Secondary | ICD-10-CM | POA: Insufficient documentation

## 2017-04-11 DIAGNOSIS — C50412 Malignant neoplasm of upper-outer quadrant of left female breast: Secondary | ICD-10-CM

## 2017-04-11 LAB — CBC WITH DIFFERENTIAL (CANCER CENTER ONLY)
BASOS PCT: 1 %
Basophils Absolute: 0 10*3/uL (ref 0.0–0.1)
EOS ABS: 0.1 10*3/uL (ref 0.0–0.5)
EOS PCT: 2 %
HCT: 38 % (ref 34.8–46.6)
HEMOGLOBIN: 12.4 g/dL (ref 11.6–15.9)
LYMPHS ABS: 1.4 10*3/uL (ref 0.9–3.3)
Lymphocytes Relative: 31 %
MCH: 31.5 pg (ref 25.1–34.0)
MCHC: 32.6 g/dL (ref 31.5–36.0)
MCV: 96.8 fL (ref 79.5–101.0)
MONO ABS: 0.5 10*3/uL (ref 0.1–0.9)
MONOS PCT: 11 %
NEUTROS PCT: 55 %
Neutro Abs: 2.6 10*3/uL (ref 1.5–6.5)
Platelet Count: 181 10*3/uL (ref 145–400)
RBC: 3.93 MIL/uL (ref 3.70–5.45)
RDW: 13 % (ref 11.2–14.5)
WBC Count: 4.7 10*3/uL (ref 3.9–10.3)

## 2017-04-11 LAB — CMP (CANCER CENTER ONLY)
ALK PHOS: 77 U/L (ref 40–150)
ALT: 19 U/L (ref 0–55)
AST: 21 U/L (ref 5–34)
Albumin: 3.5 g/dL (ref 3.5–5.0)
Anion gap: 7 (ref 3–11)
BUN: 19 mg/dL (ref 7–26)
CALCIUM: 9.4 mg/dL (ref 8.4–10.4)
CHLORIDE: 107 mmol/L (ref 98–109)
CO2: 26 mmol/L (ref 22–29)
CREATININE: 0.7 mg/dL (ref 0.60–1.10)
GFR, Estimated: >60 mL/min (ref >60)
GLUCOSE: 86 mg/dL (ref 70–140)
Potassium: 4.4 mmol/L (ref 3.5–5.1)
SODIUM: 140 mmol/L (ref 136–145)
Total Bilirubin: 0.4 mg/dL (ref 0.2–1.2)
Total Protein: 7.5 g/dL (ref 6.4–8.3)

## 2017-04-11 NOTE — Telephone Encounter (Signed)
Gave patient AVS and calendar of upcoming October appointments °

## 2017-04-12 ENCOUNTER — Other Ambulatory Visit: Payer: 59

## 2017-04-12 ENCOUNTER — Ambulatory Visit: Payer: 59 | Admitting: Oncology

## 2017-04-18 ENCOUNTER — Encounter: Payer: Self-pay | Admitting: Oncology

## 2017-04-18 ENCOUNTER — Other Ambulatory Visit: Payer: Self-pay | Admitting: Oncology

## 2017-04-18 DIAGNOSIS — Z170 Estrogen receptor positive status [ER+]: Principal | ICD-10-CM

## 2017-04-18 DIAGNOSIS — C50512 Malignant neoplasm of lower-outer quadrant of left female breast: Secondary | ICD-10-CM

## 2017-05-31 DIAGNOSIS — H524 Presbyopia: Secondary | ICD-10-CM | POA: Diagnosis not present

## 2017-05-31 DIAGNOSIS — H2513 Age-related nuclear cataract, bilateral: Secondary | ICD-10-CM | POA: Diagnosis not present

## 2017-06-01 ENCOUNTER — Ambulatory Visit
Admission: RE | Admit: 2017-06-01 | Discharge: 2017-06-01 | Disposition: A | Discharge status: Home / Self Care | Payer: Medicare Other | Source: Ambulatory Visit | Attending: Oncology | Admitting: Oncology

## 2017-06-01 DIAGNOSIS — C50412 Malignant neoplasm of upper-outer quadrant of left female breast: Secondary | ICD-10-CM

## 2017-06-01 DIAGNOSIS — Z170 Estrogen receptor positive status [ER+]: Secondary | ICD-10-CM

## 2017-06-01 DIAGNOSIS — R922 Inconclusive mammogram: Secondary | ICD-10-CM | POA: Diagnosis not present

## 2017-06-01 DIAGNOSIS — D0511 Intraductal carcinoma in situ of right breast: Secondary | ICD-10-CM

## 2017-06-21 DIAGNOSIS — E538 Deficiency of other specified B group vitamins: Secondary | ICD-10-CM | POA: Diagnosis not present

## 2017-06-21 DIAGNOSIS — Z6822 Body mass index (BMI) 22.0-22.9, adult: Secondary | ICD-10-CM | POA: Diagnosis not present

## 2017-06-21 DIAGNOSIS — M859 Disorder of bone density and structure, unspecified: Secondary | ICD-10-CM | POA: Diagnosis not present

## 2017-06-21 DIAGNOSIS — D0510 Intraductal carcinoma in situ of unspecified breast: Secondary | ICD-10-CM | POA: Diagnosis not present

## 2017-06-21 DIAGNOSIS — I10 Essential (primary) hypertension: Secondary | ICD-10-CM | POA: Diagnosis not present

## 2017-06-21 DIAGNOSIS — E890 Postprocedural hypothyroidism: Secondary | ICD-10-CM | POA: Diagnosis not present

## 2017-06-21 DIAGNOSIS — M109 Gout, unspecified: Secondary | ICD-10-CM | POA: Diagnosis not present

## 2017-06-21 DIAGNOSIS — C50919 Malignant neoplasm of unspecified site of unspecified female breast: Secondary | ICD-10-CM | POA: Diagnosis not present

## 2017-06-21 DIAGNOSIS — J45909 Unspecified asthma, uncomplicated: Secondary | ICD-10-CM | POA: Diagnosis not present

## 2017-06-21 DIAGNOSIS — E7849 Other hyperlipidemia: Secondary | ICD-10-CM | POA: Diagnosis not present

## 2017-06-22 DIAGNOSIS — H2511 Age-related nuclear cataract, right eye: Secondary | ICD-10-CM | POA: Diagnosis not present

## 2017-06-22 DIAGNOSIS — H25811 Combined forms of age-related cataract, right eye: Secondary | ICD-10-CM | POA: Diagnosis not present

## 2017-06-23 ENCOUNTER — Other Ambulatory Visit: Payer: Self-pay

## 2017-06-23 ENCOUNTER — Ambulatory Visit (INDEPENDENT_AMBULATORY_CARE_PROVIDER_SITE_OTHER): Payer: Medicare Other | Admitting: Certified Nurse Midwife

## 2017-06-23 ENCOUNTER — Encounter: Payer: Self-pay | Admitting: Certified Nurse Midwife

## 2017-06-23 ENCOUNTER — Other Ambulatory Visit (HOSPITAL_COMMUNITY)
Admission: RE | Admit: 2017-06-23 | Discharge: 2017-06-23 | Disposition: A | Discharge status: Home / Self Care | Payer: Medicare Other | Source: Ambulatory Visit | Attending: Obstetrics & Gynecology | Admitting: Obstetrics & Gynecology

## 2017-06-23 VITALS — BP 148/78 | HR 70 | Resp 12 | Ht 62.25 in | Wt 126.2 lb

## 2017-06-23 DIAGNOSIS — Z853 Personal history of malignant neoplasm of breast: Secondary | ICD-10-CM | POA: Diagnosis not present

## 2017-06-23 DIAGNOSIS — Z01419 Encounter for gynecological examination (general) (routine) without abnormal findings: Secondary | ICD-10-CM

## 2017-06-23 DIAGNOSIS — A6009 Herpesviral infection of other urogenital tract: Secondary | ICD-10-CM

## 2017-06-23 DIAGNOSIS — Z124 Encounter for screening for malignant neoplasm of cervix: Secondary | ICD-10-CM | POA: Insufficient documentation

## 2017-06-23 MED ORDER — VALACYCLOVIR HCL 500 MG PO TABS: ORAL_TABLET | ORAL | 11 refills | Status: DC

## 2017-06-23 NOTE — Progress Notes (Signed)
72 y.o. G2P2 Married  Caucasian Fe here for annual exam. Post menopausal no HRT. Denies vaginal bleeding. Has seen Sherriil Pearline Cables for pelvic support exercise and evaluation and now using new lubricant for vaginal dryness, working well.. Seeing Dr. Griffith Citron with oncology for breast cancer reoccurrence, on Tamoxifen now. Follow up  every 6 months. Recent Aex with Dr. Forde Dandy with labs all normal, continues with Hypothyroid/insomina management there. Has not noted any Lichen flares. Has HSV occurrences occasional and request update on Rx if needed. Constipation but is going to try Miralax to see if change. No other health issues today.  Patient's last menstrual period was 01/02/2000.          Sexually active: Yes.    The current method of family planning is status post hysterectomy.    Exercising: Yes.    aerobics, walking, golf Smoker:  no  Health Maintenance: Pap:  04-04-15 neg History of Abnormal Pap: no MMG:  06-01-17 birads 2:neg Self Breast exams: occasionally Colonoscopy:  2004, cologard 2017 neg per patient  BMD:   2016 TDaP:  >10 years Shingles: no Pneumonia: 1999 Hep C and HIV: no Labs: PCP   reports that she has never smoked. She has never used smokeless tobacco. She reports that she drinks about 3.6 oz of alcohol per week. She reports that she does not use drugs.  Past Medical History:  Diagnosis Date  . Acute asthmatic bronchitis   . Asthma, mild intermittent   . Breast cancer ([place]) 08/2012   right-DCIS, +ER/PR  . Cystocele    Grade 2  . Diverticulosis 2004  . Fibroids 1998  . Fracture of fibula 2007   left;  hit with golf ball  . Heart murmur    nl echo  . History of radiation therapy 09/16/2016- 10/08/2016   Left Breast. 42.56 Gy in 16 fractions.   . History of TMJ disorder   . Hx of radiation therapy 10/11/12- 11/13/12   right breast 4800 cGy 24 sessions  . Hypertension   . Hypothyroidism   . Osteopenia 2002  . Raynaud's disease   . Shingles   . Vasomotor rhinitis      Past Surgical History:  Procedure Laterality Date  . ABDOMINAL HYSTERECTOMY    . BREAST BIOPSY Left 2015   MRI- Benign  . BREAST BIOPSY Right 07/05/2012   Stereo- Malignant  . BREAST EXCISIONAL BIOPSY Left   . BREAST EXCISIONAL BIOPSY Left   . BREAST LUMPECTOMY Right 08/10/12   [facility], NO RESIDUAL DISEASE  . HYSTEROSCOPY WITH RESECTOSCOPE  2002   submucous fibroid  . INCONTINENCE SURGERY    . MINOR BREAST BIOPSY Left    times two  . ROTATOR CUFF REPAIR Right 2010  . THYROIDECTOMY      Current Outpatient Medications  Medication Sig Dispense Refill  . budesonide-formoterol (SYMBICORT) 80-4.5 MCG/ACT inhaler Inhale 2 puffs into the lungs 2 (two) times daily. (Patient taking differently: Inhale 2 puffs into the lungs as needed. ) 1 Inhaler 11  . cholecalciferol (VITAMIN D) 1000 UNITS tablet Take 1,000 Units by mouth 2 (two) times a week.    . clobetasol cream (TEMOVATE) 1.54 % APPLY 1 APPLICATION ON THE SKIN AS DIRECTED  0  . colchicine 0.6 MG tablet as needed.   2  . fluticasone (FLONASE) 50 MCG/ACT nasal spray Place 2 sprays into both nostrils as needed.     . gabapentin (NEURONTIN) 100 MG capsule Take 1-2 capsules (100-200 mg total) by mouth at bedtime. 90 capsule 4  .  levothyroxine (SYNTHROID, LEVOTHROID) 75 MCG tablet Take 75 mcg by mouth. 6 days a week take 1 pill & 1 day a week take 1/2 a pill    . loratadine (CLARITIN CHILDRENS) 5 MG chewable tablet Chew 5 mg by mouth daily. Uses as needed    . moxifloxacin (VIGAMOX) 0.5 % ophthalmic solution   1  . neomycin-polymyxin b-dexamethasone (MAXITROL) 3.5-10000-0.1 OINT APP A SMALL AMOUNT TO LOWER EYELID IN OD HS FOR 2 WEEKS  0  . prednisoLONE acetate (PRED FORTE) 1 % ophthalmic suspension   1  . tamoxifen (NOLVADEX) 20 MG tablet TAKE 1 TABLET(20 MG) BY MOUTH DAILY 90 tablet 0  . valACYclovir (VALTREX) 500 MG tablet TAKE 1 TABLET BY MOUTH ONCE DAILY, INCREASE TO TWICE DAILY AT ONSET OF OUTBREAK. 45 tablet 11  . zolpidem (AMBIEN) 5  MG tablet Take 5 mg by mouth at bedtime as needed for sleep.     No current facility-administered medications for this visit.     Family History  Problem Relation Age of Onset  . Aneurysm Mother   . Hypertension Sister   . Cancer Sister        tongue  . Clotting disorder Maternal Grandfather        liver  . Cancer Maternal Grandfather        liver    ROS:  Pertinent items are noted in HPI.  Otherwise, a comprehensive ROS was negative.  Exam:   BP (!) 148/78 (BP Location: Right Arm, Patient Position: Sitting, Cuff Size: Normal)   Pulse 70   Resp 12   Ht 5' 2.25" (1.581 m)   Wt 126 lb 4 oz (57.3 kg)   LMP 01/02/2000   BMI 22.91 kg/m  Height: 5' 2.25" (158.1 cm) Ht Readings from Last 3 Encounters:  06/23/17 5' 2.25" (1.581 m)  01/20/17 5\' 2"  (1.575 m)  01/13/17 5\' 2"  (1.575 m)    General appearance: alert, cooperative and appears stated age Head: Normocephalic, without obvious abnormality, atraumatic Neck: no adenopathy, supple, symmetrical, trachea midline and thyroid normal to inspection and palpation Lungs: clear to auscultation bilaterally Breasts: normal appearance, no masses or tenderness, No nipple retraction or dimpling, No nipple discharge or bleeding, No axillary or supraclavicular adenopathy, surgical changes left breast Heart: regular rate and rhythm Abdomen: soft, non-tender; no masses,  no organomegaly Extremities: extremities normal, atraumatic, no cyanosis or edema Skin: Skin color, texture, turgor normal. No rashes or lesions Lymph nodes: Cervical, supraclavicular, and axillary nodes normal. No abnormal inguinal nodes palpated Neurologic: Grossly normal   Pelvic: External genitalia:  no lesions              Urethra:  normal appearing urethra with no masses, tenderness or lesions              Bartholin's and Skene's: normal                 Vagina: normal appearing vagina with normal color and discharge, no lesions              Cervix: absent               Pap taken: Yes.   Bimanual Exam:  Uterus:  uterus absent              Adnexa: normal adnexa and no mass, fullness, tenderness               Rectovaginal: Confirms  Anus:  normal sphincter tone, no lesions  Chaperone present: yes  A:  Well Woman with normal exam  Post menopausal no HRT  Left breast cancer secondary occurrence now on Tamoxifen with oncology management  Vaginal tone improving with PT management  Hypothyroid, asthma management, Osteoporosis management with  PCP   P:   Reviewed health and wellness pertinent to exam  Continue follow up with oncology and mammogram as indicated. Can do clinical breast exam every 6 months if she desires.  Continue with PT for vaginal support issues.  Continue follow with PCP as indicated.  Pap smear: yes   counseled on breast self exam, feminine hygiene, adequate intake of calcium and vitamin D, diet and exercise, Kegel's exercises  return annually or prn  An After Visit Summary was printed and given to the patient.

## 2017-06-23 NOTE — Patient Instructions (Signed)

## 2017-06-28 LAB — CYTOLOGY - PAP: Diagnosis: NEGATIVE

## 2017-07-22 ENCOUNTER — Encounter: Payer: Self-pay | Admitting: Oncology

## 2017-07-22 ENCOUNTER — Other Ambulatory Visit: Payer: Self-pay | Admitting: Oncology

## 2017-07-22 DIAGNOSIS — C50512 Malignant neoplasm of lower-outer quadrant of left female breast: Secondary | ICD-10-CM

## 2017-07-22 DIAGNOSIS — Z170 Estrogen receptor positive status [ER+]: Principal | ICD-10-CM

## 2017-08-03 DIAGNOSIS — D1801 Hemangioma of skin and subcutaneous tissue: Secondary | ICD-10-CM | POA: Diagnosis not present

## 2017-08-03 DIAGNOSIS — D2261 Melanocytic nevi of right upper limb, including shoulder: Secondary | ICD-10-CM | POA: Diagnosis not present

## 2017-08-03 DIAGNOSIS — D2371 Other benign neoplasm of skin of right lower limb, including hip: Secondary | ICD-10-CM | POA: Diagnosis not present

## 2017-08-03 DIAGNOSIS — L820 Inflamed seborrheic keratosis: Secondary | ICD-10-CM | POA: Diagnosis not present

## 2017-08-03 DIAGNOSIS — L930 Discoid lupus erythematosus: Secondary | ICD-10-CM | POA: Diagnosis not present

## 2017-08-03 DIAGNOSIS — L821 Other seborrheic keratosis: Secondary | ICD-10-CM | POA: Diagnosis not present

## 2017-09-06 ENCOUNTER — Ambulatory Visit: Payer: 59 | Admitting: Oncology

## 2017-09-06 ENCOUNTER — Other Ambulatory Visit: Payer: 59

## 2017-09-13 ENCOUNTER — Other Ambulatory Visit: Payer: 59

## 2017-09-13 ENCOUNTER — Ambulatory Visit: Payer: 59 | Admitting: Oncology

## 2017-09-27 ENCOUNTER — Telehealth: Payer: Self-pay | Admitting: Adult Health

## 2017-09-27 NOTE — Telephone Encounter (Signed)
Patient called to reschedule  °

## 2017-10-02 DIAGNOSIS — M545 Low back pain, unspecified: Secondary | ICD-10-CM | POA: Insufficient documentation

## 2017-10-05 DIAGNOSIS — H2512 Age-related nuclear cataract, left eye: Secondary | ICD-10-CM | POA: Diagnosis not present

## 2017-10-05 DIAGNOSIS — H25812 Combined forms of age-related cataract, left eye: Secondary | ICD-10-CM | POA: Diagnosis not present

## 2017-10-06 DIAGNOSIS — Z8679 Personal history of other diseases of the circulatory system: Secondary | ICD-10-CM | POA: Diagnosis not present

## 2017-10-06 DIAGNOSIS — E039 Hypothyroidism, unspecified: Secondary | ICD-10-CM | POA: Diagnosis not present

## 2017-10-06 DIAGNOSIS — E538 Deficiency of other specified B group vitamins: Secondary | ICD-10-CM | POA: Diagnosis not present

## 2017-10-06 DIAGNOSIS — M5416 Radiculopathy, lumbar region: Secondary | ICD-10-CM | POA: Diagnosis not present

## 2017-10-06 DIAGNOSIS — G629 Polyneuropathy, unspecified: Secondary | ICD-10-CM | POA: Diagnosis not present

## 2017-10-06 DIAGNOSIS — E890 Postprocedural hypothyroidism: Secondary | ICD-10-CM | POA: Diagnosis not present

## 2017-10-06 DIAGNOSIS — I10 Essential (primary) hypertension: Secondary | ICD-10-CM | POA: Diagnosis not present

## 2017-10-11 ENCOUNTER — Other Ambulatory Visit: Payer: Self-pay | Admitting: Oncology

## 2017-10-15 DIAGNOSIS — Z23 Encounter for immunization: Secondary | ICD-10-CM | POA: Diagnosis not present

## 2017-10-24 ENCOUNTER — Other Ambulatory Visit: Payer: Self-pay | Admitting: Oncology

## 2017-10-24 DIAGNOSIS — C50512 Malignant neoplasm of lower-outer quadrant of left female breast: Secondary | ICD-10-CM

## 2017-10-24 DIAGNOSIS — Z170 Estrogen receptor positive status [ER+]: Principal | ICD-10-CM

## 2017-11-01 ENCOUNTER — Other Ambulatory Visit: Payer: BLUE CROSS/BLUE SHIELD

## 2017-11-01 ENCOUNTER — Ambulatory Visit: Payer: Self-pay | Admitting: Oncology

## 2017-11-02 ENCOUNTER — Other Ambulatory Visit: Payer: Self-pay | Admitting: Adult Health

## 2017-11-02 DIAGNOSIS — C50412 Malignant neoplasm of upper-outer quadrant of left female breast: Secondary | ICD-10-CM

## 2017-11-02 DIAGNOSIS — Z170 Estrogen receptor positive status [ER+]: Secondary | ICD-10-CM

## 2017-11-03 ENCOUNTER — Telehealth: Payer: Self-pay | Admitting: Oncology

## 2017-11-03 ENCOUNTER — Encounter: Payer: Self-pay | Admitting: Adult Health

## 2017-11-03 ENCOUNTER — Other Ambulatory Visit: Payer: BLUE CROSS/BLUE SHIELD

## 2017-11-03 ENCOUNTER — Inpatient Hospital Stay: Payer: Medicare Other | Attending: Oncology

## 2017-11-03 ENCOUNTER — Inpatient Hospital Stay (HOSPITAL_BASED_OUTPATIENT_CLINIC_OR_DEPARTMENT_OTHER): Payer: Medicare Other | Admitting: Adult Health

## 2017-11-03 ENCOUNTER — Ambulatory Visit: Payer: Self-pay | Admitting: Oncology

## 2017-11-03 VITALS — BP 136/65 | HR 58 | Temp 98.4°F | Resp 18 | Wt 126.2 lb

## 2017-11-03 DIAGNOSIS — Z79810 Long term (current) use of selective estrogen receptor modulators (SERMs): Secondary | ICD-10-CM

## 2017-11-03 DIAGNOSIS — Q72812 Congenital shortening of left lower limb: Secondary | ICD-10-CM | POA: Diagnosis not present

## 2017-11-03 DIAGNOSIS — Z170 Estrogen receptor positive status [ER+]: Secondary | ICD-10-CM | POA: Insufficient documentation

## 2017-11-03 DIAGNOSIS — M9905 Segmental and somatic dysfunction of pelvic region: Secondary | ICD-10-CM | POA: Diagnosis not present

## 2017-11-03 DIAGNOSIS — C50812 Malignant neoplasm of overlapping sites of left female breast: Secondary | ICD-10-CM | POA: Insufficient documentation

## 2017-11-03 DIAGNOSIS — C50412 Malignant neoplasm of upper-outer quadrant of left female breast: Secondary | ICD-10-CM

## 2017-11-03 DIAGNOSIS — M5442 Lumbago with sciatica, left side: Secondary | ICD-10-CM | POA: Diagnosis not present

## 2017-11-03 DIAGNOSIS — M9903 Segmental and somatic dysfunction of lumbar region: Secondary | ICD-10-CM | POA: Diagnosis not present

## 2017-11-03 LAB — CBC WITH DIFFERENTIAL (CANCER CENTER ONLY)
Basophils Absolute: 0 10*3/uL (ref 0.0–0.1)
Basophils Relative: 1 %
Eosinophils Absolute: 0.1 10*3/uL (ref 0.0–0.5)
Eosinophils Relative: 2 %
HCT: 35.3 % (ref 34.8–46.6)
Hemoglobin: 11.6 g/dL (ref 11.6–15.9)
Lymphocytes Relative: 44 %
Lymphs Abs: 1.8 10*3/uL (ref 0.9–3.3)
MCH: 31.2 pg (ref 25.1–34.0)
MCHC: 32.9 g/dL (ref 31.5–36.0)
MCV: 94.7 fL (ref 79.5–101.0)
Monocytes Absolute: 0.5 10*3/uL (ref 0.1–0.9)
Monocytes Relative: 12 %
Neutro Abs: 1.7 10*3/uL (ref 1.5–6.5)
Neutrophils Relative %: 41 %
Platelet Count: 188 10*3/uL (ref 145–400)
RBC: 3.72 MIL/uL (ref 3.70–5.45)
RDW: 13 % (ref 11.2–14.5)
WBC Count: 4.1 10*3/uL (ref 3.9–10.3)

## 2017-11-03 LAB — CMP (CANCER CENTER ONLY)
ALT: 17 U/L (ref 0–44)
AST: 19 U/L (ref 15–41)
Albumin: 3.3 g/dL — ABNORMAL LOW (ref 3.5–5.0)
Alkaline Phosphatase: 74 U/L (ref 38–126)
Anion gap: 7 (ref 5–15)
BUN: 16 mg/dL (ref 8–23)
CO2: 25 mmol/L (ref 22–32)
Calcium: 8.7 mg/dL — ABNORMAL LOW (ref 8.9–10.3)
Chloride: 109 mmol/L (ref 98–111)
Creatinine: 0.67 mg/dL (ref 0.44–1.00)
GFR, Est AFR Am: >60 mL/min
GFR, Estimated: >60 mL/min
Glucose, Bld: 95 mg/dL (ref 70–99)
Potassium: 3.9 mmol/L (ref 3.5–5.1)
Sodium: 141 mmol/L (ref 135–145)
Total Bilirubin: 0.3 mg/dL (ref 0.3–1.2)
Total Protein: 7 g/dL (ref 6.5–8.1)

## 2017-11-03 NOTE — Progress Notes (Signed)
[place]  [phone] [phone]     ID: [name] DOB: 01/09/1946  MR#: [id number]  BDZ#:[id number]  Patient Care Team: [name] Bowen, MD as PCP - General (Endocrinology) [name], [name] Dad, MD as Consulting Physician (Oncology) [name] Rosenthal, MD as Referring Physician (Surgery) [name], [name] Mako, MD as Referring Physician (Oncology) [name] Puna, MD as Referring Physician (Radiation Oncology) [name] Gibson, MD as Attending Physician (Radiation Oncology) [name] [name], CNM as Referring Physician (Certified Nurse Midwife) OTHER MD:  CHIEF COMPLAINT: Estrogen receptor positive breast cancer  CURRENT TREATMENT: Tamoxifen  INTERVAL HISTORY: [name] [name] returns today for follow-up and treatment of her estrogen receptor positive breast cancer.  She continues on tamoxifen, with good tolerance. She has noted some mild weight gain.  She has hot flashes that gabapentin 111m at night helps with.  She denies vaginal discharge, or increase in arthralgias.    REVIEW OF SYSTEMS: DNelia Shiis doing well today.  She is up to date with skin cancer screening, colon cancer screening, and gyn cancer screening.  She sees her PCP and her other medical team when indicated.  She exercises regularly.    Otherwise DNelia Shiis doing well and a detailed ROS was non contributory.    HISTORY OF CURRENT ILLNESS: From the original intake note:  "[name] [name]" underwent right breast upper inner quadrant lumpectomy for ductal carcinoma in situ, 08/10/2012. She was treated with adjuvant radiation to a total of 48 gray, completed 11/13/2012.  MRI guided biopsy of a central left breast lesion 05/25/2013 showed sclerosing adenosis.  On 07/30/2015 she underwent bilateral diagnostic mammography with tomography. The breast density was category C. In addition to prior scars there was a 0.3 cm area of dystrophic calcifications. This was felt to be likely benign and 6 month  follow-up was recommended.  That was performed 01/15/2016. The calcifications now appeared to be associated with an oil cyst.   More recently, in April 2018 the patient's gynecologist palpated a change in the left breast, at the 3:00 area, 3 fingerbreadths from the areola. [facility] only changes of prior excisional biopsies. On physical exam however there was a firm thickening in the lateral left breast, at the site of a prior biopsy. Targeted ultrasonography of this area (3:00 radiant 5 cm from the nipple) found an irregular hypoechoic region measuring 0.8 cm. This underlying the scar. Ultrasound of the left axilla was benign.  Biopsy of the left breast area in question 06/09/2016 showed (S [id number]) and invasive ductal carcinoma, E-cadherin positive, estrogen receptor 80% positive, progesterone receptor 60% positive, both with strong staining intensity, with an MIB-1 of 15% and no HER-2 amplification, the signals ratio being 1.00 and the number per cell 1.15.  [facility] left lumpectomy and sentinel lymph node sampling, with the final pathology ([id number] finding invasive adenocarcinoma with mixed lobular and ductal features, but E-cadherin positive, grade 2, measuring 1.9 cm. Both sentinel lymph nodes were benign.  An Oncotype DX score was obtained on this sample, and it was 21; the patient was referred to radiation oncology[place]  The patient's subsequent history is as detailed below.    PAST MEDICAL HISTORY: Past Medical History:  Diagnosis Date  . Acute asthmatic bronchitis   . Asthma, mild intermittent   . [place]   [place]-DCIS, +ER/PR  . Cystocele  Grade 2  . Diverticulosis 2004  . Fibroids 1998  . Fracture of fibula 2007   left;  hit with golf ball  . Heart murmur      nl echo  . History of radiation therapy 09/16/2016- 10/08/2016   Left Breast. 42.56 Gy in 16 fractions.   . History of TMJ disorder   . Hx of radiation therapy 10/11/12- 11/13/12   right breast 4800 cGy 24 sessions  . Hypertension   . Hypothyroidism   . Osteopenia 2002  . Raynaud's disease   . Shingles   . Vasomotor rhinitis     PAST SURGICAL HISTORY: Past Surgical History:  Procedure Laterality Date  . ABDOMINAL HYSTERECTOMY    . BREAST BIOPSY Left 2015   MRI- Benign  . BREAST BIOPSY Right 07/05/2012   Stereo- Malignant  . BREAST EXCISIONAL BIOPSY Left   . BREAST EXCISIONAL BIOPSY Left   . BREAST LUMPECTOMY Right 08/10/12   [facility], NO RESIDUAL DISEASE  . HYSTEROSCOPY WITH RESECTOSCOPE  2002   submucous fibroid  . INCONTINENCE SURGERY    . MINOR BREAST BIOPSY Left    times two  . ROTATOR CUFF REPAIR Right 2010  . THYROIDECTOMY      FAMILY HISTORY Family History  Problem Relation Age of Onset  . Aneurysm Mother   . Hypertension Sister   . Cancer Sister        tongue  . Clotting disorder Maternal Grandfather        liver  . Cancer Maternal Grandfather        liver  The patient's father died of pneumonia at age 7. The patient's mother died at the age of 48 from a ruptured brain aneurysm. The patient had no brothers. The patient had 4 sisters. One sister developed tongue cancer. There is no history of breast or ovarian cancer in the family to her knowledge  GYNECOLOGIC HISTORY:  Patient's last menstrual period was 01/02/2000. Menarche age 70, first live birth age 36, the patient is [place] P2. She stopped having periods around the year 2000. She used hormone replacement approximately 13 years, until July 2014.  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 Her husband [name] [name] owns a local [employer] franchise. Son [name] [name] lives in [place], works in Insurance underwriter. Son [name] [name] is currently [employer]. Incidentally [name] [name] had a chronic granulomatous disease and  received a stem cell transplant from his brother, with excellent results. The patient has 5 grandchildren. She attends [employer]     ADVANCED DIRECTIVES:    HEALTH MAINTENANCE: Social History   Tobacco Use  . Smoking status: Never Smoker  . Smokeless tobacco: Never Used  Substance Use Topics  . Alcohol use: Yes    Alcohol/week: 6.0 standard drinks    Types: 6 Standard drinks or equivalent per week  . Drug use: No     Colonoscopy:  PAP:  Bone density: 12/10/2014 showed a T score of -2.4   Allergies  Allergen Reactions  . Pneumovax 23  [Pneumococcal Vac Polyvalent] Swelling    Swollen upper arm  . Effexor [Venlafaxine] Nausea And Vomiting  . Penicillins Hives  . Pneumovax [Pneumococcal Polysaccharide Vaccine] Swelling    Current Outpatient Medications  Medication Sig Dispense Refill  . budesonide-formoterol (SYMBICORT) 80-4.5 MCG/ACT inhaler Inhale 2 puffs into the lungs 2 (two) times daily. (Patient taking differently: Inhale 2 puffs into the lungs as needed. ) 1 Inhaler 11  . cholecalciferol (VITAMIN D) 1000 UNITS tablet Take 1,000 Units by mouth 2 (  two) times a week.    . clobetasol cream (TEMOVATE) 3.79 % APPLY 1 APPLICATION ON THE SKIN AS DIRECTED  0  . colchicine 0.6 MG tablet as needed.   2  . fluticasone (FLONASE) 50 MCG/ACT nasal spray Place 2 sprays into both nostrils as needed.     . gabapentin (NEURONTIN) 100 MG capsule TAKE 1 TO 2 CAPSULES(100 TO 200 MG) BY MOUTH AT BEDTIME 90 capsule 0  . levothyroxine (SYNTHROID, LEVOTHROID) 75 MCG tablet Take 75 mcg by mouth. 6 days a week take 1 pill & 1 day a week take 1/2 a pill    . loratadine (CLARITIN CHILDRENS) 5 MG chewable tablet Chew 5 mg by mouth daily. Uses as needed    . tamoxifen (NOLVADEX) 20 MG tablet TAKE 1 TABLET(20 MG) BY MOUTH DAILY 90 tablet 0  . valACYclovir (VALTREX) 500 MG tablet One tablet daily for suppression, increase to twice daily for 3 days with outbreak 45 tablet 11  . zolpidem (AMBIEN) 5 MG  tablet Take 5 mg by mouth at bedtime as needed for sleep.     No current facility-administered medications for this visit.     OBJECTIVE:  Vitals:   11/03/17 1446  BP: 136/65  Pulse: (!) 58  Resp: 18  Temp: 98.4 F (36.9 C)  SpO2: 100%     Body mass index is 22.91 kg/m.   Wt Readings from Last 3 Encounters:  11/03/17 126 lb 4 oz (57.3 kg)  06/23/17 126 lb 4 oz (57.3 kg)  04/11/17 125 lb 9.6 oz (57 kg)      ECOG FS:1 - Symptomatic but completely ambulatory GENERAL: Patient is a well appearing female in no acute distress HEENT:  Sclerae anicteric.  Oropharynx clear and moist. No ulcerations or evidence of oropharyngeal candidiasis. Neck is supple.  NODES:  No cervical, supraclavicular, or axillary lymphadenopathy palpated.  BREAST EXAM:  Left and right breast have undergone lumpectomies, no sign of recurrence noted LUNGS:  Clear to auscultation bilaterally.  No wheezes or rhonchi. HEART:  Regular rate and rhythm. No murmur appreciated. ABDOMEN:  Soft, nontender.  Positive, normoactive bowel sounds. No organomegaly palpated. MSK:  No focal spinal tenderness to palpation. Full range of motion bilaterally in the upper extremities. EXTREMITIES:  No peripheral edema.   SKIN:  Clear with no obvious rashes or skin changes. No nail dyscrasia. NEURO:  Nonfocal. Well oriented.  Appropriate affect.   LAB RESULTS:  CMP     Component Value Date/Time   NA 140 04/11/2017 1145   NA 142 10/11/2016 1547   K 4.4 04/11/2017 1145   K 4.3 10/11/2016 1547   CL 107 04/11/2017 1145   CO2 26 04/11/2017 1145   CO2 26 10/11/2016 1547   GLUCOSE 86 04/11/2017 1145   GLUCOSE 88 10/11/2016 1547   BUN 19 04/11/2017 1145   BUN 18.5 10/11/2016 1547   CREATININE 0.70 04/11/2017 1145   CREATININE 0.7 10/11/2016 1547   CALCIUM 9.4 04/11/2017 1145   CALCIUM 9.4 10/11/2016 1547   PROT 7.5 04/11/2017 1145   PROT 7.6 10/11/2016 1547   ALBUMIN 3.5 04/11/2017 1145   ALBUMIN 3.7 10/11/2016 1547   AST  21 04/11/2017 1145   AST 19 10/11/2016 1547   ALT 19 04/11/2017 1145   ALT 12 10/11/2016 1547   ALKPHOS 77 04/11/2017 1145   ALKPHOS 103 10/11/2016 1547   BILITOT 0.4 04/11/2017 1145   BILITOT 0.23 10/11/2016 1547   GFRNONAA >60 04/11/2017 1145   GFRAA >60  04/11/2017 1145    No results found for: Ronnald Ramp, A1GS, A2GS, BETS, BETA2SER, GAMS, MSPIKE, SPEI  No results found for: Nils Pyle, [facility]  Lab Results  Component Value Date   WBC 4.1 11/03/2017   NEUTROABS 1.7 11/03/2017   HGB 11.6 11/03/2017   HCT 35.3 11/03/2017   MCV 94.7 11/03/2017   PLT 188 11/03/2017      Chemistry      Component Value Date/Time   NA 140 04/11/2017 1145   NA 142 10/11/2016 1547   K 4.4 04/11/2017 1145   K 4.3 10/11/2016 1547   CL 107 04/11/2017 1145   CO2 26 04/11/2017 1145   CO2 26 10/11/2016 1547   BUN 19 04/11/2017 1145   BUN 18.5 10/11/2016 1547   CREATININE 0.70 04/11/2017 1145   CREATININE 0.7 10/11/2016 1547      Component Value Date/Time   CALCIUM 9.4 04/11/2017 1145   CALCIUM 9.4 10/11/2016 1547   ALKPHOS 77 04/11/2017 1145   ALKPHOS 103 10/11/2016 1547   AST 21 04/11/2017 1145   AST 19 10/11/2016 1547   ALT 19 04/11/2017 1145   ALT 12 10/11/2016 1547   BILITOT 0.4 04/11/2017 1145   BILITOT 0.23 10/11/2016 1547       No results found for: LABCA2  No components found for: GFQMKJ031  No results for input(s): INR in the last 168 hours.  No results found for: LABCA2  No results found for: YOF188  No results found for: QLR373  No results found for: GKK159  No results found for: CA2729  No components found for: HGQUANT  No results found for: CEA1 / No results found for: CEA1   No results found for: AFPTUMOR  No results found for: CHROMOGRNA  No results found for: PSA1  Appointment on 11/03/2017  Component Date Value Ref Range Status  . WBC Count 11/03/2017 4.1  3.9 - 10.3 K/uL Final  . RBC 11/03/2017 3.72  3.70 - 5.45  MIL/uL Final  . Hemoglobin 11/03/2017 11.6  11.6 - 15.9 g/dL Final  . HCT 11/03/2017 35.3  34.8 - 46.6 % Final  . MCV 11/03/2017 94.7  79.5 - 101.0 fL Final  . MCH 11/03/2017 31.2  25.1 - 34.0 pg Final  . MCHC 11/03/2017 32.9  31.5 - 36.0 g/dL Final  . RDW 11/03/2017 13.0  11.2 - 14.5 % Final  . Platelet Count 11/03/2017 188  145 - 400 K/uL Final  . Neutrophils Relative % 11/03/2017 41  % Final  . Neutro Abs 11/03/2017 1.7  1.5 - 6.5 K/uL Final  . Lymphocytes Relative 11/03/2017 44  % Final  . Lymphs Abs 11/03/2017 1.8  0.9 - 3.3 K/uL Final  . Monocytes Relative 11/03/2017 12  % Final  . Monocytes Absolute 11/03/2017 0.5  0.1 - 0.9 K/uL Final  . Eosinophils Relative 11/03/2017 2  % Final  . Eosinophils Absolute 11/03/2017 0.1  0.0 - 0.5 K/uL Final  . Basophils Relative 11/03/2017 1  % Final  . Basophils Absolute 11/03/2017 0.0  0.0 - 0.1 K/uL Final   Performed at [facility], [address], [place], [place] [address]    (this displays the last labs from the last 3 days)  No results found for: TOTALPROTELP, ALBUMINELP, A1GS, A2GS, BETS, BETA2SER, GAMS, MSPIKE, SPEI (this displays SPEP labs)  No results found for: KPAFRELGTCHN, LAMBDASER, KAPLAMBRATIO (kappa/lambda light chains)  No results found for: HGBA, HGBA2QUANT, HGBFQUANT, HGBSQUAN (Hemoglobinopathy evaluation)   No results found for:  LDH  No results found for: IRON, TIBC, IRONPCTSAT (Iron and TIBC)  No results found for: FERRITIN  Urinalysis    Component Value Date/Time   BILIRUBINUR n 04/04/2015 1330   PROTEINUR n 04/04/2015 1330   UROBILINOGEN negative 04/04/2015 1330   NITRITE n 04/04/2015 1330   LEUKOCYTESUR Negative 04/04/2015 1330     STUDIES:    ELIGIBLE FOR AVAILABLE RESEARCH PROTOCOL: no  ASSESSMENT: 72 y.o. [place] woman  (1) status post right lumpectomy and margin clearance 08/10/2012 for ductal carcinoma in situ, grade 2 or 3, estrogen and progesterone receptor  positive,  (2) adjuvant radiation to the right breast completed 11/13/2012.  (3) status post left breast biopsy 05/25/2013 for sclerosing adenosis.  (4) left breast biopsy 06/09/2016 showed a clinical T1b N0, stage IA invasive ductal carcinoma (E-cadherin positive) estrogen and progesterone receptor positive, HER-2 nonamplified, with an MIB-1 of 15%.  (5) left lumpectomy and sentinel lymph node sampling 07/12/2016 showed a pT1c pN0, stage IA invasive ductal carcinoma, grade 2, with clear margins.  (6) Oncotype DX score of 21 predicted a 10 year risk of outside the breast recurrence of 13% if the patient's only systemic therapy was tamoxifen for 5 years. It also predicted no benefit from chemotherapy  (7) adjuvant radiation completed 10/08/2016  (8) started tamoxifen 11/01/2016  PLAN: [name] [name] is doing well today.  She is without any clinical or radiographic sign of breast cancer recurrence.  She is tolerating the tamoxifen well, other than some hot flashes that have resolved with the addition of gabapentin 160m po qhs.  She will continue tamoxifen.  She will be due for her next mammogram in 06/2018.  [name] [name] and I reviewed her health maintenance.  She has a very good lifestyle.  She is compliant and up to date with her cancer screenings and f/u appointments with her doctors.  She exercises and eats well.  I encouraged her to continue this.    I reviewed her with Dr. MJana Hakim  She will return in one year for labs and f/u with him.  She will see gyn in May.  She knows to call for any other issues that may develop before the next visit.  A total of (30) minutes of face-to-face time was spent with this patient with greater than 50% of that time in counseling and care-coordination.  LWilber Bihari NP  11/03/17 2:50 PM Medical Oncology and Hematology [facility] [name] [id number]. [phone]   Fax. [phone]

## 2017-11-03 NOTE — Telephone Encounter (Signed)
Patient decline avs and calendar °

## 2017-11-11 DIAGNOSIS — M5442 Lumbago with sciatica, left side: Secondary | ICD-10-CM | POA: Diagnosis not present

## 2017-11-11 DIAGNOSIS — Q72812 Congenital shortening of left lower limb: Secondary | ICD-10-CM | POA: Diagnosis not present

## 2017-11-11 DIAGNOSIS — M9903 Segmental and somatic dysfunction of lumbar region: Secondary | ICD-10-CM | POA: Diagnosis not present

## 2017-11-11 DIAGNOSIS — M9905 Segmental and somatic dysfunction of pelvic region: Secondary | ICD-10-CM | POA: Diagnosis not present

## 2017-11-15 ENCOUNTER — Other Ambulatory Visit: Payer: Self-pay | Admitting: Endocrinology

## 2017-11-15 DIAGNOSIS — M5410 Radiculopathy, site unspecified: Secondary | ICD-10-CM

## 2017-11-16 DIAGNOSIS — M9903 Segmental and somatic dysfunction of lumbar region: Secondary | ICD-10-CM | POA: Diagnosis not present

## 2017-11-16 DIAGNOSIS — M9905 Segmental and somatic dysfunction of pelvic region: Secondary | ICD-10-CM | POA: Diagnosis not present

## 2017-11-16 DIAGNOSIS — M5442 Lumbago with sciatica, left side: Secondary | ICD-10-CM | POA: Diagnosis not present

## 2017-11-16 DIAGNOSIS — Q72812 Congenital shortening of left lower limb: Secondary | ICD-10-CM | POA: Diagnosis not present

## 2017-11-18 DIAGNOSIS — Q72812 Congenital shortening of left lower limb: Secondary | ICD-10-CM | POA: Diagnosis not present

## 2017-11-18 DIAGNOSIS — M5442 Lumbago with sciatica, left side: Secondary | ICD-10-CM | POA: Diagnosis not present

## 2017-11-18 DIAGNOSIS — M9905 Segmental and somatic dysfunction of pelvic region: Secondary | ICD-10-CM | POA: Diagnosis not present

## 2017-11-18 DIAGNOSIS — M9903 Segmental and somatic dysfunction of lumbar region: Secondary | ICD-10-CM | POA: Diagnosis not present

## 2017-11-20 ENCOUNTER — Ambulatory Visit
Admission: RE | Admit: 2017-11-20 | Discharge: 2017-11-20 | Disposition: A | Discharge status: Home / Self Care | Payer: BLUE CROSS/BLUE SHIELD | Source: Ambulatory Visit | Attending: Endocrinology | Admitting: Endocrinology

## 2017-11-20 DIAGNOSIS — M47816 Spondylosis without myelopathy or radiculopathy, lumbar region: Secondary | ICD-10-CM | POA: Diagnosis not present

## 2017-11-20 DIAGNOSIS — M5410 Radiculopathy, site unspecified: Secondary | ICD-10-CM

## 2017-11-20 MED ORDER — GADOBENATE DIMEGLUMINE 529 MG/ML IV SOLN
12.0000 mL | Freq: Once | INTRAVENOUS | Status: AC | PRN
  Administered 2017-11-20: 12 mL via INTRAVENOUS

## 2017-11-21 DIAGNOSIS — M9903 Segmental and somatic dysfunction of lumbar region: Secondary | ICD-10-CM | POA: Diagnosis not present

## 2017-11-21 DIAGNOSIS — M5442 Lumbago with sciatica, left side: Secondary | ICD-10-CM | POA: Diagnosis not present

## 2017-11-21 DIAGNOSIS — Q72812 Congenital shortening of left lower limb: Secondary | ICD-10-CM | POA: Diagnosis not present

## 2017-11-21 DIAGNOSIS — M9905 Segmental and somatic dysfunction of pelvic region: Secondary | ICD-10-CM | POA: Diagnosis not present

## 2017-11-23 DIAGNOSIS — M9903 Segmental and somatic dysfunction of lumbar region: Secondary | ICD-10-CM | POA: Diagnosis not present

## 2017-11-23 DIAGNOSIS — M5442 Lumbago with sciatica, left side: Secondary | ICD-10-CM | POA: Diagnosis not present

## 2017-11-23 DIAGNOSIS — M9905 Segmental and somatic dysfunction of pelvic region: Secondary | ICD-10-CM | POA: Diagnosis not present

## 2017-11-23 DIAGNOSIS — Q72812 Congenital shortening of left lower limb: Secondary | ICD-10-CM | POA: Diagnosis not present

## 2017-11-24 ENCOUNTER — Other Ambulatory Visit: Payer: Self-pay | Admitting: Endocrinology

## 2017-11-24 DIAGNOSIS — M5442 Lumbago with sciatica, left side: Secondary | ICD-10-CM | POA: Diagnosis not present

## 2017-11-24 DIAGNOSIS — M9903 Segmental and somatic dysfunction of lumbar region: Secondary | ICD-10-CM | POA: Diagnosis not present

## 2017-11-24 DIAGNOSIS — M9905 Segmental and somatic dysfunction of pelvic region: Secondary | ICD-10-CM | POA: Diagnosis not present

## 2017-11-24 DIAGNOSIS — Q72812 Congenital shortening of left lower limb: Secondary | ICD-10-CM | POA: Diagnosis not present

## 2017-11-24 DIAGNOSIS — M519 Unspecified thoracic, thoracolumbar and lumbosacral intervertebral disc disorder: Secondary | ICD-10-CM

## 2017-11-27 ENCOUNTER — Ambulatory Visit
Admission: RE | Admit: 2017-11-27 | Discharge: 2017-11-27 | Disposition: A | Discharge status: Home / Self Care | Payer: BLUE CROSS/BLUE SHIELD | Source: Ambulatory Visit | Attending: Endocrinology | Admitting: Endocrinology

## 2017-11-27 DIAGNOSIS — M519 Unspecified thoracic, thoracolumbar and lumbosacral intervertebral disc disorder: Secondary | ICD-10-CM | POA: Diagnosis not present

## 2017-11-27 MED ORDER — GADOBENATE DIMEGLUMINE 529 MG/ML IV SOLN
10.0000 mL | Freq: Once | INTRAVENOUS | Status: AC | PRN
  Administered 2017-11-27: 10 mL via INTRAVENOUS

## 2017-11-28 DIAGNOSIS — Q72812 Congenital shortening of left lower limb: Secondary | ICD-10-CM | POA: Diagnosis not present

## 2017-11-28 DIAGNOSIS — M9905 Segmental and somatic dysfunction of pelvic region: Secondary | ICD-10-CM | POA: Diagnosis not present

## 2017-11-28 DIAGNOSIS — M5442 Lumbago with sciatica, left side: Secondary | ICD-10-CM | POA: Diagnosis not present

## 2017-11-28 DIAGNOSIS — M9903 Segmental and somatic dysfunction of lumbar region: Secondary | ICD-10-CM | POA: Diagnosis not present

## 2017-11-29 DIAGNOSIS — M9905 Segmental and somatic dysfunction of pelvic region: Secondary | ICD-10-CM | POA: Diagnosis not present

## 2017-11-29 DIAGNOSIS — Q72812 Congenital shortening of left lower limb: Secondary | ICD-10-CM | POA: Diagnosis not present

## 2017-11-29 DIAGNOSIS — M5442 Lumbago with sciatica, left side: Secondary | ICD-10-CM | POA: Diagnosis not present

## 2017-11-29 DIAGNOSIS — M9903 Segmental and somatic dysfunction of lumbar region: Secondary | ICD-10-CM | POA: Diagnosis not present

## 2017-12-19 DIAGNOSIS — Q72812 Congenital shortening of left lower limb: Secondary | ICD-10-CM | POA: Diagnosis not present

## 2017-12-19 DIAGNOSIS — M5442 Lumbago with sciatica, left side: Secondary | ICD-10-CM | POA: Diagnosis not present

## 2017-12-19 DIAGNOSIS — M9905 Segmental and somatic dysfunction of pelvic region: Secondary | ICD-10-CM | POA: Diagnosis not present

## 2017-12-19 DIAGNOSIS — M9903 Segmental and somatic dysfunction of lumbar region: Secondary | ICD-10-CM | POA: Diagnosis not present

## 2017-12-20 DIAGNOSIS — M9905 Segmental and somatic dysfunction of pelvic region: Secondary | ICD-10-CM | POA: Diagnosis not present

## 2017-12-20 DIAGNOSIS — Q72812 Congenital shortening of left lower limb: Secondary | ICD-10-CM | POA: Diagnosis not present

## 2017-12-20 DIAGNOSIS — M5442 Lumbago with sciatica, left side: Secondary | ICD-10-CM | POA: Diagnosis not present

## 2017-12-20 DIAGNOSIS — M9903 Segmental and somatic dysfunction of lumbar region: Secondary | ICD-10-CM | POA: Diagnosis not present

## 2017-12-22 DIAGNOSIS — M545 Low back pain: Secondary | ICD-10-CM | POA: Diagnosis not present

## 2018-01-02 ENCOUNTER — Other Ambulatory Visit: Payer: Self-pay | Admitting: Oncology

## 2018-01-03 DIAGNOSIS — Z8679 Personal history of other diseases of the circulatory system: Secondary | ICD-10-CM | POA: Diagnosis not present

## 2018-01-03 DIAGNOSIS — M5416 Radiculopathy, lumbar region: Secondary | ICD-10-CM | POA: Diagnosis not present

## 2018-01-03 DIAGNOSIS — E7849 Other hyperlipidemia: Secondary | ICD-10-CM | POA: Diagnosis not present

## 2018-01-03 DIAGNOSIS — E890 Postprocedural hypothyroidism: Secondary | ICD-10-CM | POA: Diagnosis not present

## 2018-01-03 DIAGNOSIS — M109 Gout, unspecified: Secondary | ICD-10-CM | POA: Diagnosis not present

## 2018-01-03 DIAGNOSIS — E538 Deficiency of other specified B group vitamins: Secondary | ICD-10-CM | POA: Diagnosis not present

## 2018-01-03 DIAGNOSIS — I10 Essential (primary) hypertension: Secondary | ICD-10-CM | POA: Diagnosis not present

## 2018-01-03 DIAGNOSIS — C50919 Malignant neoplasm of unspecified site of unspecified female breast: Secondary | ICD-10-CM | POA: Diagnosis not present

## 2018-01-03 DIAGNOSIS — Z6822 Body mass index (BMI) 22.0-22.9, adult: Secondary | ICD-10-CM | POA: Diagnosis not present

## 2018-01-03 DIAGNOSIS — J45998 Other asthma: Secondary | ICD-10-CM | POA: Diagnosis not present

## 2018-01-03 DIAGNOSIS — M859 Disorder of bone density and structure, unspecified: Secondary | ICD-10-CM | POA: Diagnosis not present

## 2018-01-05 DIAGNOSIS — M859 Disorder of bone density and structure, unspecified: Secondary | ICD-10-CM | POA: Diagnosis not present

## 2018-01-05 DIAGNOSIS — M545 Low back pain: Secondary | ICD-10-CM | POA: Diagnosis not present

## 2018-01-22 ENCOUNTER — Other Ambulatory Visit: Payer: Self-pay | Admitting: Oncology

## 2018-01-22 DIAGNOSIS — Z170 Estrogen receptor positive status [ER+]: Principal | ICD-10-CM

## 2018-01-22 DIAGNOSIS — C50512 Malignant neoplasm of lower-outer quadrant of left female breast: Secondary | ICD-10-CM

## 2018-01-24 DIAGNOSIS — M5136 Other intervertebral disc degeneration, lumbar region: Secondary | ICD-10-CM | POA: Diagnosis not present

## 2018-01-24 DIAGNOSIS — M545 Low back pain: Secondary | ICD-10-CM | POA: Diagnosis not present

## 2018-01-27 DIAGNOSIS — M545 Low back pain: Secondary | ICD-10-CM | POA: Diagnosis not present

## 2018-01-27 DIAGNOSIS — M5136 Other intervertebral disc degeneration, lumbar region: Secondary | ICD-10-CM | POA: Diagnosis not present

## 2018-02-23 DIAGNOSIS — M5440 Lumbago with sciatica, unspecified side: Secondary | ICD-10-CM | POA: Diagnosis not present

## 2018-03-06 DIAGNOSIS — M5440 Lumbago with sciatica, unspecified side: Secondary | ICD-10-CM | POA: Diagnosis not present

## 2018-03-08 DIAGNOSIS — M5440 Lumbago with sciatica, unspecified side: Secondary | ICD-10-CM | POA: Diagnosis not present

## 2018-03-13 DIAGNOSIS — M5440 Lumbago with sciatica, unspecified side: Secondary | ICD-10-CM | POA: Diagnosis not present

## 2018-03-16 DIAGNOSIS — M5440 Lumbago with sciatica, unspecified side: Secondary | ICD-10-CM | POA: Diagnosis not present

## 2018-03-22 DIAGNOSIS — M5440 Lumbago with sciatica, unspecified side: Secondary | ICD-10-CM | POA: Diagnosis not present

## 2018-03-29 DIAGNOSIS — M5440 Lumbago with sciatica, unspecified side: Secondary | ICD-10-CM | POA: Diagnosis not present

## 2018-04-03 DIAGNOSIS — M5440 Lumbago with sciatica, unspecified side: Secondary | ICD-10-CM | POA: Diagnosis not present

## 2018-04-06 ENCOUNTER — Encounter: Payer: Medicare Other | Admitting: Vascular Surgery

## 2018-04-06 ENCOUNTER — Encounter (HOSPITAL_COMMUNITY): Payer: Medicare Other

## 2018-04-10 DIAGNOSIS — J029 Acute pharyngitis, unspecified: Secondary | ICD-10-CM | POA: Diagnosis not present

## 2018-04-10 DIAGNOSIS — Z6822 Body mass index (BMI) 22.0-22.9, adult: Secondary | ICD-10-CM | POA: Diagnosis not present

## 2018-04-14 DIAGNOSIS — M5440 Lumbago with sciatica, unspecified side: Secondary | ICD-10-CM | POA: Diagnosis not present

## 2018-04-18 DIAGNOSIS — M5440 Lumbago with sciatica, unspecified side: Secondary | ICD-10-CM | POA: Diagnosis not present

## 2018-04-24 DIAGNOSIS — M5440 Lumbago with sciatica, unspecified side: Secondary | ICD-10-CM | POA: Diagnosis not present

## 2018-04-28 DIAGNOSIS — M5440 Lumbago with sciatica, unspecified side: Secondary | ICD-10-CM | POA: Diagnosis not present

## 2018-05-22 ENCOUNTER — Other Ambulatory Visit: Payer: Self-pay | Admitting: Family Medicine

## 2018-05-22 ENCOUNTER — Other Ambulatory Visit: Payer: Self-pay | Admitting: Oncology

## 2018-05-22 DIAGNOSIS — Z853 Personal history of malignant neoplasm of breast: Secondary | ICD-10-CM

## 2018-06-05 DIAGNOSIS — M859 Disorder of bone density and structure, unspecified: Secondary | ICD-10-CM | POA: Diagnosis not present

## 2018-06-05 DIAGNOSIS — E7849 Other hyperlipidemia: Secondary | ICD-10-CM | POA: Diagnosis not present

## 2018-06-05 DIAGNOSIS — R82998 Other abnormal findings in urine: Secondary | ICD-10-CM | POA: Diagnosis not present

## 2018-06-05 DIAGNOSIS — D519 Vitamin B12 deficiency anemia, unspecified: Secondary | ICD-10-CM | POA: Diagnosis not present

## 2018-06-05 DIAGNOSIS — I10 Essential (primary) hypertension: Secondary | ICD-10-CM | POA: Diagnosis not present

## 2018-06-05 DIAGNOSIS — E890 Postprocedural hypothyroidism: Secondary | ICD-10-CM | POA: Diagnosis not present

## 2018-06-05 DIAGNOSIS — M109 Gout, unspecified: Secondary | ICD-10-CM | POA: Diagnosis not present

## 2018-06-12 DIAGNOSIS — Z0000 Encounter for general adult medical examination without abnormal findings: Secondary | ICD-10-CM | POA: Diagnosis not present

## 2018-06-12 DIAGNOSIS — M109 Gout, unspecified: Secondary | ICD-10-CM | POA: Diagnosis not present

## 2018-06-12 DIAGNOSIS — G629 Polyneuropathy, unspecified: Secondary | ICD-10-CM | POA: Diagnosis not present

## 2018-06-12 DIAGNOSIS — E785 Hyperlipidemia, unspecified: Secondary | ICD-10-CM | POA: Diagnosis not present

## 2018-06-12 DIAGNOSIS — Z1339 Encounter for screening examination for other mental health and behavioral disorders: Secondary | ICD-10-CM | POA: Diagnosis not present

## 2018-06-12 DIAGNOSIS — Z1331 Encounter for screening for depression: Secondary | ICD-10-CM | POA: Diagnosis not present

## 2018-06-12 DIAGNOSIS — H6980 Other specified disorders of Eustachian tube, unspecified ear: Secondary | ICD-10-CM | POA: Diagnosis not present

## 2018-06-12 DIAGNOSIS — E890 Postprocedural hypothyroidism: Secondary | ICD-10-CM | POA: Diagnosis not present

## 2018-06-12 DIAGNOSIS — M5416 Radiculopathy, lumbar region: Secondary | ICD-10-CM | POA: Diagnosis not present

## 2018-06-12 DIAGNOSIS — J45909 Unspecified asthma, uncomplicated: Secondary | ICD-10-CM | POA: Diagnosis not present

## 2018-06-12 DIAGNOSIS — I10 Essential (primary) hypertension: Secondary | ICD-10-CM | POA: Diagnosis not present

## 2018-06-12 DIAGNOSIS — Z8679 Personal history of other diseases of the circulatory system: Secondary | ICD-10-CM | POA: Diagnosis not present

## 2018-06-12 DIAGNOSIS — D0510 Intraductal carcinoma in situ of unspecified breast: Secondary | ICD-10-CM | POA: Diagnosis not present

## 2018-06-12 DIAGNOSIS — E538 Deficiency of other specified B group vitamins: Secondary | ICD-10-CM | POA: Diagnosis not present

## 2018-06-25 DIAGNOSIS — H6121 Impacted cerumen, right ear: Secondary | ICD-10-CM | POA: Diagnosis not present

## 2018-07-03 ENCOUNTER — Ambulatory Visit
Admission: RE | Admit: 2018-07-03 | Discharge: 2018-07-03 | Disposition: A | Discharge status: Home / Self Care | Payer: Medicare Other | Source: Ambulatory Visit | Attending: Oncology | Admitting: Oncology

## 2018-07-03 ENCOUNTER — Other Ambulatory Visit: Payer: Self-pay

## 2018-07-03 DIAGNOSIS — R922 Inconclusive mammogram: Secondary | ICD-10-CM | POA: Diagnosis not present

## 2018-07-03 DIAGNOSIS — Z853 Personal history of malignant neoplasm of breast: Secondary | ICD-10-CM

## 2018-07-05 DIAGNOSIS — R350 Frequency of micturition: Secondary | ICD-10-CM | POA: Diagnosis not present

## 2018-08-06 ENCOUNTER — Other Ambulatory Visit: Payer: Self-pay | Admitting: Certified Nurse Midwife

## 2018-08-06 DIAGNOSIS — A6009 Herpesviral infection of other urogenital tract: Secondary | ICD-10-CM

## 2018-08-07 ENCOUNTER — Other Ambulatory Visit: Payer: Self-pay | Admitting: Oncology

## 2018-08-07 DIAGNOSIS — C50512 Malignant neoplasm of lower-outer quadrant of left female breast: Secondary | ICD-10-CM

## 2018-08-07 DIAGNOSIS — Z170 Estrogen receptor positive status [ER+]: Secondary | ICD-10-CM

## 2018-08-07 NOTE — Telephone Encounter (Signed)
Medication refill request: Valtrex  Last AEX:  06/03/17 Next AEX: 09/11/18 Last MMG (if hormonal medication request): 07/03/18  Refill authorized: #45 with 1 Rf

## 2018-08-08 ENCOUNTER — Ambulatory Visit: Payer: Self-pay | Admitting: Certified Nurse Midwife

## 2018-09-06 DIAGNOSIS — M25519 Pain in unspecified shoulder: Secondary | ICD-10-CM | POA: Diagnosis not present

## 2018-09-06 DIAGNOSIS — R55 Syncope and collapse: Secondary | ICD-10-CM | POA: Diagnosis not present

## 2018-09-06 DIAGNOSIS — M25512 Pain in left shoulder: Secondary | ICD-10-CM | POA: Diagnosis not present

## 2018-09-07 ENCOUNTER — Other Ambulatory Visit: Payer: Self-pay

## 2018-09-11 ENCOUNTER — Encounter: Payer: Self-pay | Admitting: Certified Nurse Midwife

## 2018-09-11 ENCOUNTER — Other Ambulatory Visit: Payer: Self-pay

## 2018-09-11 ENCOUNTER — Ambulatory Visit (INDEPENDENT_AMBULATORY_CARE_PROVIDER_SITE_OTHER): Payer: Medicare Other | Admitting: Certified Nurse Midwife

## 2018-09-11 VITALS — BP 120/72 | HR 70 | Temp 97.2°F | Resp 16 | Ht 61.75 in | Wt 123.0 lb

## 2018-09-11 DIAGNOSIS — A6009 Herpesviral infection of other urogenital tract: Secondary | ICD-10-CM | POA: Diagnosis not present

## 2018-09-11 DIAGNOSIS — Z01419 Encounter for gynecological examination (general) (routine) without abnormal findings: Secondary | ICD-10-CM

## 2018-09-11 DIAGNOSIS — H524 Presbyopia: Secondary | ICD-10-CM | POA: Diagnosis not present

## 2018-09-11 DIAGNOSIS — Z961 Presence of intraocular lens: Secondary | ICD-10-CM | POA: Diagnosis not present

## 2018-09-11 DIAGNOSIS — H04123 Dry eye syndrome of bilateral lacrimal glands: Secondary | ICD-10-CM | POA: Diagnosis not present

## 2018-09-11 DIAGNOSIS — Z124 Encounter for screening for malignant neoplasm of cervix: Secondary | ICD-10-CM | POA: Diagnosis not present

## 2018-09-11 MED ORDER — VALACYCLOVIR HCL 500 MG PO TABS: ORAL_TABLET | ORAL | 2 refills | Status: DC

## 2018-09-11 NOTE — Progress Notes (Signed)
73 y.o. G2P2 Married  Caucasian Fe here for annual exam. Post menopausal . Vaginal dryness continues, but no vaginal bleeding. Stopped Arimidex for breast cancer per Wellness MD. Has not started Vagifem again for dryness, but has Rx per MD.. See oncology for follow up every year. Sees PCP for hypothyroid, insomnia asthma, labs and aex.  All stable per patient. Recent left shoulder injury, resolving with rest. No HSV outbreaks and will need update on RX. Caring for grandchildren now! No other health concerns today.  Patient's last menstrual period was 01/02/2000.          Sexually active: No.  The current method of family planning is status post hysterectomy.    Exercising: Yes.    walking Smoker:  no  Review of Systems  Constitutional: Negative.   HENT: Negative.   Eyes: Negative.   Respiratory: Negative.   Cardiovascular: Negative.   Gastrointestinal: Negative.   Genitourinary: Negative.   Musculoskeletal: Negative.   Skin: Negative.   Neurological: Negative.   Endo/Heme/Allergies: Negative.   Psychiatric/Behavioral: Negative.     Health Maintenance: Pap:  04-04-15 neg, 06-23-17 neg History of Abnormal Pap: no MMG:  07-03-2018 category c density birads 2:neg Self Breast exams: yes Colonoscopy:  2004, cologard 2017 neg per patient BMD:  2016 TDaP: Less than 64yrs Shingles: no Pneumonia: 1999, allergic reaction Hep C and HIV: done for insurance Labs: with PCP.   reports that she has never smoked. She has never used smokeless tobacco. She reports current alcohol use of about 6.0 standard drinks of alcohol per week. She reports that she does not use drugs.  Past Medical History:  Diagnosis Date  . Acute asthmatic bronchitis   . Asthma, mild intermittent   . Breast cancer ([place]) 08/2012   right-DCIS, +ER/PR  . Cystocele    Grade 2  . Diverticulosis 2004  . Fibroids 1998  . Fracture of fibula 2007   left;  hit with golf ball  . Heart murmur    nl echo  . History of radiation  therapy 09/16/2016- 10/08/2016   Left Breast. 42.56 Gy in 16 fractions.   . History of TMJ disorder   . Hx of radiation therapy 10/11/12- 11/13/12   right breast 4800 cGy 24 sessions  . Hypertension   . Hypothyroidism   . Osteopenia 2002  . Raynaud's disease   . Shingles   . Vasomotor rhinitis     Past Surgical History:  Procedure Laterality Date  . ABDOMINAL HYSTERECTOMY    . BREAST BIOPSY Left 2015   MRI- Benign  . BREAST BIOPSY Right 07/05/2012   Stereo- Malignant  . BREAST EXCISIONAL BIOPSY Left   . BREAST EXCISIONAL BIOPSY Left   . BREAST LUMPECTOMY Right 08/10/12   [facility], NO RESIDUAL DISEASE  . HYSTEROSCOPY WITH RESECTOSCOPE  2002   submucous fibroid  . INCONTINENCE SURGERY    . MINOR BREAST BIOPSY Left    times two  . ROTATOR CUFF REPAIR Right 2010  . THYROIDECTOMY      Current Outpatient Medications  Medication Sig Dispense Refill  . cholecalciferol (VITAMIN D) 1000 UNITS tablet Take 1,000 Units by mouth 2 (two) times a week.    . clobetasol cream (TEMOVATE) 3.50 % APPLY 1 APPLICATION ON THE SKIN AS DIRECTED  0  . levothyroxine (SYNTHROID, LEVOTHROID) 75 MCG tablet Take 75 mcg by mouth. 6 days a week take 1 pill & 1 day a week take 1/2 a pill    . loratadine (CLARITIN CHILDRENS) 5 MG  chewable tablet Chew 5 mg by mouth daily. Uses as needed    . Probiotic Product (PROBIOTIC PO) Take by mouth.    . valACYclovir (VALTREX) 500 MG tablet TAKE 1 TABLET BY MOUTH DAILY FOR SUPRESSION, INCREASE TO TWICE DAILY FOR 3 DAYS WITH OUTBREAK 45 tablet 1  . zolpidem (AMBIEN) 5 MG tablet Take 5 mg by mouth at bedtime as needed for sleep.     No current facility-administered medications for this visit.     Family History  Problem Relation Age of Onset  . Aneurysm Mother   . Hypertension Sister   . Cancer Sister        tongue  . Clotting disorder Maternal Grandfather        liver  . Cancer Maternal Grandfather        liver  . Breast cancer Neg Hx     ROS:  Pertinent items are  noted in HPI.  Otherwise, a comprehensive ROS was negative.  Exam:   BP 120/72   Pulse 70   Temp (!) 97.2 F (36.2 C) (Skin)   Resp 16   Ht 5' 1.75" (1.568 m)   Wt 123 lb (55.8 kg)   LMP 01/02/2000   BMI 22.68 kg/m  Height: 5' 1.75" (156.8 cm) Ht Readings from Last 3 Encounters:  09/11/18 5' 1.75" (1.568 m)  06/23/17 5' 2.25" (1.581 m)  01/20/17 5\' 2"  (1.575 m)    General appearance: alert, cooperative and appears stated age Head: Normocephalic, without obvious abnormality, atraumatic Neck: no adenopathy, supple, symmetrical, trachea midline and thyroid normal to inspection and palpation Lungs: clear to auscultation bilaterally Breasts: No nipple retraction or dimpling, No nipple discharge or bleeding, No axillary or supraclavicular adenopathy, surgical scarring from breast cancer bilateral Heart: regular rate and rhythm Abdomen: soft, non-tender; no masses,  no organomegaly Extremities: extremities normal, atraumatic, no cyanosis or edema Skin: Skin color, texture, turgor normal. No rashes or lesions Lymph nodes: Cervical, supraclavicular, and axillary nodes normal. No abnormal inguinal nodes palpated Neurologic: Grossly normal   Pelvic: External genitalia:  no lesions              Urethra:  normal appearing urethra with no masses, tenderness or lesions              Bartholin's and Skene's: normal                 Vagina: normal appearing vagina with normal color and discharge, no lesions              Cervix: absent              Pap taken: No. Bimanual Exam:  Uterus:  uterus absent              Adnexa: no mass, fullness, tenderness               Rectovaginal: Confirms               Anus:  normal sphincter tone, no lesions  Chaperone present: yes  A:  Well Woman with normal exam  Post menopausal no HRT  Atrophic vaginitis, has Rx for Vagifem per MD, plans to try once weekly only.  History of Breast cancer bilateral(different occurrences) on Arimidex, tolerating well.  Still in follow up with oncology.  Recent left shoulder injury resolving  Vitamin D, Hypothyroid management with MD  History of HSV needs update of Valtrex  P:   Reviewed health and wellness pertinent to exam  Aware if vaginal bleeding with Vagifem use needs to advise  Continue follow up with MD as indicated with breast cancer treatment  See Orthopedic if shoulder injury not resolving  Continue follow up with PCP as indicated  Rx Valtrex see order with instructions.  Pap smear: no   counseled on breast self exam, mammography screening, feminine hygiene, osteoporosis, adequate intake of calcium and vitamin D, diet and exercise  return annually or prn  An After Visit Summary was printed and given to the patient.

## 2018-09-12 DIAGNOSIS — H04122 Dry eye syndrome of left lacrimal gland: Secondary | ICD-10-CM | POA: Diagnosis not present

## 2018-09-12 DIAGNOSIS — H04121 Dry eye syndrome of right lacrimal gland: Secondary | ICD-10-CM | POA: Diagnosis not present

## 2018-09-13 DIAGNOSIS — S46012A Strain of muscle(s) and tendon(s) of the rotator cuff of left shoulder, initial encounter: Secondary | ICD-10-CM | POA: Diagnosis not present

## 2018-09-13 DIAGNOSIS — M25412 Effusion, left shoulder: Secondary | ICD-10-CM | POA: Diagnosis not present

## 2018-09-13 DIAGNOSIS — R600 Localized edema: Secondary | ICD-10-CM | POA: Diagnosis not present

## 2018-09-13 DIAGNOSIS — M19012 Primary osteoarthritis, left shoulder: Secondary | ICD-10-CM | POA: Diagnosis not present

## 2018-09-20 DIAGNOSIS — M25512 Pain in left shoulder: Secondary | ICD-10-CM | POA: Diagnosis not present

## 2018-09-20 DIAGNOSIS — M7552 Bursitis of left shoulder: Secondary | ICD-10-CM | POA: Diagnosis not present

## 2018-10-13 DIAGNOSIS — M25512 Pain in left shoulder: Secondary | ICD-10-CM | POA: Diagnosis not present

## 2018-10-13 DIAGNOSIS — R0602 Shortness of breath: Secondary | ICD-10-CM | POA: Diagnosis not present

## 2018-10-13 DIAGNOSIS — R55 Syncope and collapse: Secondary | ICD-10-CM | POA: Diagnosis not present

## 2018-10-13 DIAGNOSIS — E890 Postprocedural hypothyroidism: Secondary | ICD-10-CM | POA: Diagnosis not present

## 2018-10-13 DIAGNOSIS — C50919 Malignant neoplasm of unspecified site of unspecified female breast: Secondary | ICD-10-CM | POA: Diagnosis not present

## 2018-10-13 DIAGNOSIS — J45909 Unspecified asthma, uncomplicated: Secondary | ICD-10-CM | POA: Diagnosis not present

## 2018-10-13 DIAGNOSIS — D649 Anemia, unspecified: Secondary | ICD-10-CM | POA: Diagnosis not present

## 2018-10-17 ENCOUNTER — Other Ambulatory Visit (HOSPITAL_COMMUNITY): Payer: Self-pay | Admitting: Endocrinology

## 2018-10-17 ENCOUNTER — Other Ambulatory Visit: Payer: Self-pay

## 2018-10-17 ENCOUNTER — Ambulatory Visit (HOSPITAL_COMMUNITY)
Admission: RE | Admit: 2018-10-17 | Discharge: 2018-10-17 | Disposition: A | Discharge status: Home / Self Care | Payer: Medicare Other | Source: Ambulatory Visit | Attending: Endocrinology | Admitting: Endocrinology

## 2018-10-17 DIAGNOSIS — I080 Rheumatic disorders of both mitral and aortic valves: Secondary | ICD-10-CM | POA: Diagnosis not present

## 2018-10-17 DIAGNOSIS — D2371 Other benign neoplasm of skin of right lower limb, including hip: Secondary | ICD-10-CM | POA: Diagnosis not present

## 2018-10-17 DIAGNOSIS — I119 Hypertensive heart disease without heart failure: Secondary | ICD-10-CM | POA: Insufficient documentation

## 2018-10-17 DIAGNOSIS — L821 Other seborrheic keratosis: Secondary | ICD-10-CM | POA: Diagnosis not present

## 2018-10-17 DIAGNOSIS — L820 Inflamed seborrheic keratosis: Secondary | ICD-10-CM | POA: Diagnosis not present

## 2018-10-17 DIAGNOSIS — M19019 Primary osteoarthritis, unspecified shoulder: Secondary | ICD-10-CM | POA: Diagnosis not present

## 2018-10-17 DIAGNOSIS — M75112 Incomplete rotator cuff tear or rupture of left shoulder, not specified as traumatic: Secondary | ICD-10-CM | POA: Diagnosis not present

## 2018-10-17 DIAGNOSIS — R55 Syncope and collapse: Secondary | ICD-10-CM | POA: Diagnosis not present

## 2018-10-17 DIAGNOSIS — L930 Discoid lupus erythematosus: Secondary | ICD-10-CM | POA: Diagnosis not present

## 2018-10-17 NOTE — Progress Notes (Signed)
  Echocardiogram 2D Echocardiogram has been performed.  [name] [name] 10/17/2018, 1:57 PM

## 2018-10-23 ENCOUNTER — Telehealth: Payer: Self-pay | Admitting: Oncology

## 2018-10-23 NOTE — Telephone Encounter (Signed)
R/s appt per 9/21 sch message - pt aware of appt date and time

## 2018-10-27 ENCOUNTER — Other Ambulatory Visit: Payer: Self-pay

## 2018-10-27 ENCOUNTER — Encounter: Payer: Self-pay | Admitting: Internal Medicine

## 2018-10-27 ENCOUNTER — Ambulatory Visit (INDEPENDENT_AMBULATORY_CARE_PROVIDER_SITE_OTHER): Payer: Medicare Other | Admitting: Internal Medicine

## 2018-10-27 DIAGNOSIS — J4520 Mild intermittent asthma, uncomplicated: Secondary | ICD-10-CM

## 2018-10-27 NOTE — Patient Instructions (Signed)
At the first sign of respiratory symptoms, immediately symbicort 80 Take 2 puffs first thing in am and then another 2 puffs about 12 hours later x one week, then take as needed     If you are satisfied with your treatment plan,  let your doctor know and he/she can either refill your medications or you can return here when your prescription runs out.     If in any way you are not 100% satisfied,  please tell us.  If 100% better, tell your friends!  Pulmonary follow up is as needed

## 2018-10-27 NOTE — Progress Notes (Addendum)
Subjective:     Patient ID: [name] [name], female   DOB: Nov 21, 1945    MRN: [id number]    Brief patient profile:  25 yowf never smoker previously followed for asthma last seen by Dr Lake Bells 11/14/14 rec wean off asmanex unless needed seasonally esp in summer.    History of Present Illness  05/24/2016 acute extended ov/[name] [name] re: ? [name] [name] s asthma flare so far Chief Complaint  Patient presents with  . Acute Visit    Pt c/o sore throat, chills and low grade temp x 3 days. She has also had some congestion and PND.   acutely ill since 05/22/16 but already chills/sore throat are gone, still stuffy in head and really not having chest symptoms yet but started asmanex just in case  And mostly concerned now about how to treat nasal symptoms/ only otc she's tried = baby clariton No recent abx or prednisone use No need for saba at all (proair respiclick) Thinks caught cold from grandchild REC Stop asthmanex unless you are needing the proair on a more regular basis than what you've been doing (more than twice weekly for more than a few weeks)  Only use your albuterol (proair respiclick)  as a rescue medication   For head colds > advil cold and sinus  For itching sneezy runny nose ok to use clariton (ok to use both advil cold and sinus and clariton but not clariton D  If not improving after 5 days of illness, go ahead and use the Zpak     01/13/2017 acute extended ov/[name] [name] re: recurrent cough/ ? Cough variant asthma  Chief Complaint  Patient presents with  . Follow-up    she states that she is still having a non productive cough-better than last time.   made it thru summer of 2018 and thru most of the fall felt  fine s asmanex / on prn clariton and flonase / no need for saba  then abuptly ill first week Dec 2018 scratchy throat/ dry cough/ no sob / no better with proair respiclick/ restarted azmanex s benefit so far/ nasal symptoms not really flaring  Already seen by NP 01/03/17 zpak/ pred > no better   Not coughing at night / mostly daytime urge to clear the throat/ hacking dry cough and no sob rec Gabapentin 100 mg build up to three times  Daily and then back of to twice daily once the cough is gone  Try prilosec otc 20mg  x 2   Take 30-60 min before first meal of the day and Pepcid ac (famotidine) 20 mg one @  bedtime until cough is completely gone for at least a week without the need for any  cough suppression at all  GERD  For cough / wheeze>>   As needed  Symbicort 80 Take 2 puffs first thing in am and then another 2 puffs about 12 hours later.    01/20/2017  f/u ov/[name] [name] re:  Cough variant asthma vs uacs  Chief Complaint  Patient presents with  . Follow-up    Pt states that she is getting better but is not all the way better. States that "I cough when I talk."  Denies any SOB or CP. but occ. chest tightness.   daytime cough taking 1 at bedtime gabapentin as when takes two feels groggy when wakes up  Cough is 75% improved and most bothersome with voice use//can't really tell much difference on vs off symb rec Continue prilosec 20 mg  X 2 Take 30-60  min before first meal of the day and pepcid 20 mg at bedtime until cough is completely gone for a week  Prednisone 10 mg take  4 each am x 2 days,   2 each am x 2 days,  1 each am x 2 days and stop  Change gabapentin to 100mg  in am and at bedtime      10/27/2018  f/u ov/[name] [name] re:  Cough variant asthma vs uacs avg symb 80 2 bid   Chief Complaint  Patient presents with  . Feels lightheaded at times    Started this summer - was seen by cardiology 10/17/18.  Dyspnea:  Not limited by breathing from desired activities   Cough: none  Sleeping: able to lie flat  SABA use: none 02: none     No obvious day to day or daytime variability or assoc excess/ purulent sputum or mucus plugs or hemoptysis or cp or chest tightness, subjective wheeze or overt sinus or hb symptoms.   Sleeping  without nocturnal  or early am exacerbation  of  respiratory  c/o's or need for noct saba. Also denies any obvious fluctuation of symptoms with weather or environmental changes or other aggravating or alleviating factors except as outlined above   No unusual exposure hx or h/o childhood pna/ asthma or knowledge of premature birth.  Current Allergies, Complete Past Medical History, Past Surgical History, Family History, and Social History were reviewed in [employer] record.  ROS  The following are not active complaints unless bolded Hoarseness, sore throat, dysphagia, dental problems, itching, sneezing,  nasal congestion or discharge of excess mucus or purulent secretions, ear ache,   fever, chills, sweats, unintended wt loss or wt gain, classically pleuritic or exertional cp,  orthopnea pnd or arm/hand swelling  or leg swelling, presyncope, palpitations, abdominal pain, anorexia, nausea, vomiting, diarrhea  or change in bowel habits or change in bladder habits, change in stools or change in urine, dysuria, hematuria,  rash, arthralgias, visual complaints, headache, numbness, weakness or ataxia or problems with walking or coordination,  change in mood or  memory.        Current Meds  Medication Sig  . anastrozole (ARIMIDEX) 1 MG tablet Take 1 mg by mouth daily.  . cholecalciferol (VITAMIN D) 1000 UNITS tablet Take 1,000 Units by mouth 2 (two) times a week.  . clobetasol cream (TEMOVATE) [id number] % APPLY 1 APPLICATION ON THE SKIN AS DIRECTED  . Estradiol 10 MCG INST Place vaginally.  Marland Kitchen levothyroxine (SYNTHROID, LEVOTHROID) 75 MCG tablet Take 75 mcg by mouth. 6 days a week take 1 pill & 1 day a week take 1/2 a pill  . loratadine (CLARITIN CHILDRENS) 5 MG chewable tablet Chew 5 mg by mouth daily. Uses as needed  . Probiotic Product (PROBIOTIC PO) Take by mouth.  Marland Kitchen UNABLE TO FIND Testosterone 1mg /ml cream  . valACYclovir (VALTREX) 500 MG tablet TAKE 1 TABLET BY MOUTH DAILY FOR SUPRESSION, INCREASE TO TWICE DAILY FOR 3 DAYS WITH  OUTBREAK  . zolpidem (AMBIEN) 5 MG tablet Take 5 mg by mouth at bedtime as needed for sleep.                   Objective:   Physical Exam  amb wf nad   10/27/2018        124   01/13/2017     126   05/24/16 127 lb 9.6 oz (57.9 kg)  11/27/15 127 lb (57.6 kg)  05/06/15 130 lb (59 kg)  Vital signs reviewed - Note on arrival 02 sats  93% on RA    HEENT : pt wearing mask not removed for exam due to covid -19 concerns.    NECK :  without JVD/Nodes/TM/ nl carotid upstrokes bilaterally   LUNGS: no acc muscle use,  Nl contour chest which is clear to A and P bilaterally without cough on insp or exp maneuvers   CV:  RRR  no s3 or murmur or increase in P2, and no edema   ABD:  soft and nontender with nl inspiratory excursion in the supine position. No bruits or organomegaly appreciated, bowel sounds nl  MS:  Nl gait/ ext warm without deformities, calf tenderness, cyanosis or clubbing No obvious joint restrictions   SKIN: warm and dry without lesions    NEURO:  alert, approp, nl sensorium with  no motor or cerebellar deficits apparent.               Assessment:

## 2018-10-29 ENCOUNTER — Encounter: Payer: Self-pay | Admitting: Internal Medicine

## 2018-10-29 NOTE — Assessment & Plan Note (Signed)
05/24/2016  After extensive coaching HFA effectiveness =    90% with DPI > d/c 01/13/2017 due to cough  - FENO 01/03/2017  =   30  - 01/13/2017  After extensive coaching HFA effectiveness =    75% from baseline nearly 0    Try symbicort 80 up to 2 q 12h prn  Allergy profile  01/20/17 >  Eos 0.1 /  IgE  8 RAST neg   - The proper method of use, as well as anticipated side effects, of a metered-dose inhaler are discussed and demonstrated to the patient.     Based on two studies from NEJM  378; 20 p 1865 (2018) and 380 : p2020-30 (2019) in pts with mild asthma it is reasonable to use low dose symbicort eg 80 2bid "prn" flare in this setting but I emphasized this was only shown with symbicort and takes advantage of the rapid onset of action but is not the same as "rescue therapy" but can be stopped once the acute symptoms have resolved and the need for rescue has been minimized (< 2 x weekly)     Reviewed above strategy emphasizing that for flares really needs to take a min of 1 week of symb to take adantage of the ICS component and call if not responding to full dose rx, o/w can take prn and f/u here prn as well  I had an extended discussion with the patient reviewing all relevant studies completed to date and  lasting 15 to 20 minutes of a 25 minute visit    I performed detailed device teaching using a teach back method which extended face to face time for this visit (see above)  Each maintenance medication was reviewed in detail including emphasizing most importantly the difference between maintenance and prns and under what circumstances the prns are to be triggered using an action plan format that is not reflected in the computer generated alphabetically organized AVS which I have not found useful in most complex patients, especially with respiratory illnesses  Please see AVS for specific instructions unique to this visit that I personally wrote and verbalized to the the pt in detail and then  reviewed with pt  by my nurse highlighting any  changes in therapy recommended at today's visit to their plan of care.

## 2018-11-06 ENCOUNTER — Ambulatory Visit: Payer: BLUE CROSS/BLUE SHIELD | Admitting: Oncology

## 2018-11-06 ENCOUNTER — Other Ambulatory Visit: Payer: BLUE CROSS/BLUE SHIELD

## 2018-11-16 DIAGNOSIS — M7542 Impingement syndrome of left shoulder: Secondary | ICD-10-CM | POA: Insufficient documentation

## 2018-11-16 DIAGNOSIS — M7552 Bursitis of left shoulder: Secondary | ICD-10-CM | POA: Insufficient documentation

## 2018-11-24 DIAGNOSIS — M7541 Impingement syndrome of right shoulder: Secondary | ICD-10-CM | POA: Diagnosis not present

## 2018-11-24 DIAGNOSIS — M7542 Impingement syndrome of left shoulder: Secondary | ICD-10-CM | POA: Diagnosis not present

## 2018-11-24 DIAGNOSIS — M7552 Bursitis of left shoulder: Secondary | ICD-10-CM | POA: Diagnosis not present

## 2018-12-06 DIAGNOSIS — M7541 Impingement syndrome of right shoulder: Secondary | ICD-10-CM | POA: Diagnosis not present

## 2018-12-11 DIAGNOSIS — M7541 Impingement syndrome of right shoulder: Secondary | ICD-10-CM | POA: Diagnosis not present

## 2018-12-15 DIAGNOSIS — M7541 Impingement syndrome of right shoulder: Secondary | ICD-10-CM | POA: Diagnosis not present

## 2018-12-18 DIAGNOSIS — M7541 Impingement syndrome of right shoulder: Secondary | ICD-10-CM | POA: Diagnosis not present

## 2018-12-22 DIAGNOSIS — M25511 Pain in right shoulder: Secondary | ICD-10-CM | POA: Diagnosis not present

## 2018-12-27 DIAGNOSIS — M25511 Pain in right shoulder: Secondary | ICD-10-CM | POA: Diagnosis not present

## 2019-01-02 ENCOUNTER — Other Ambulatory Visit: Payer: Self-pay

## 2019-01-02 DIAGNOSIS — C50812 Malignant neoplasm of overlapping sites of left female breast: Secondary | ICD-10-CM

## 2019-01-02 DIAGNOSIS — Z170 Estrogen receptor positive status [ER+]: Secondary | ICD-10-CM

## 2019-01-02 NOTE — Progress Notes (Signed)
[facility]  [phone] [phone]     ID: [name] [name] DOB: 12-31-1945  MR#: [id number]  IRJ#:[id number]  Patient Care Team: [name] Bowen, MD as PCP - General (Endocrinology) [name] [name], [name] Dad, MD as Consulting Physician (Oncology) [name] Rosenthal, MD as Referring Physician (Surgery) [name] [name], [name] Mako, MD as Referring Physician (Oncology) [name] Puna, MD as Referring Physician (Radiation Oncology) [name] Gibson, MD as Attending Physician (Radiation Oncology) [name] [name], CNM as Referring Physician (Certified Nurse Midwife) OTHER MD:  CHIEF COMPLAINT: Estrogen receptor positive breast cancer  CURRENT TREATMENT: tamoxifen   INTERVAL HISTORY: [name] [name] returns today for follow-up of her estrogen receptor positive breast cancer.   She is getting medical direction from a wellness counselor, [name] [name], who works virtually from his home.  He has recommended that she not receive any vaccines for the coronavirus when they become available.  He has also started her on testosterone and estrogen  [name] [name]'s tamoxifen was stopped in August 2020 and she started anastrozole 3 times a week at that time.  She is tolerating this well, although she still has some hot flashes.  Note that she already went through our pelvic rehab program in the past.  Since her last visit, she underwent bilateral diagnostic mammography with tomography at [facility] on 07/03/2018 showing: breast density category C; no evidence of malignancy in either breast.  She presented with a 9 month history of low back pain and bilateral leg pain. She underwent lumbar spine MRI on 11/20/2017, which showed: chronic degenerative changes without apparent compressive stenosis, no change since 2016; new abnormality at T11.  In light of the T11 abnormality, she underwent thoracic spine MRI on 11/27/2017, which showed: signal abnormality on the left at T11 is best attributed  to costovertebral arthritis, no evidence of aggressive bone lesion.   REVIEW OF SYSTEMS: [name] [name] does a video exercise routine every day, takes walks, uses her bike, and does some weights.  She is very careful regarding the pandemic.  Her husband does go to the office to work but she tells me it is very safe where he is.  She was feeling tired when she was on tamoxifen but she was found to have a hemoglobin of less than 10.  She was started on iron supplementation and that has corrected.  Now she feels considerably better.  She tells me Dr. Cheron [name] told her that the inside of the vaginal area was doing fine although the outside was somewhat dry.  She is using an estrogen cream for this.  She has not noted any libido improvement on the testosterone.  A detailed review of systems today was otherwise noncontributory   HISTORY OF CURRENT ILLNESS: From the original intake note:  "[name] [name]" underwent right breast upper inner quadrant lumpectomy for ductal carcinoma in situ, 08/10/2012. She was treated with adjuvant radiation to a total of 48 gray, completed 11/13/2012.  MRI guided biopsy of a central left breast lesion 05/25/2013 showed sclerosing adenosis.  On 07/30/2015 she underwent bilateral diagnostic mammography with tomography. The breast density was category C. In addition to prior scars there was a 0.3 cm area of dystrophic calcifications. This was felt to be likely benign and 6 month follow-up was recommended.  That was performed 01/15/2016. The calcifications now appeared to be associated with an oil cyst.   More recently, in April 2018 the patient's gynecologist palpated a change in the left breast, at the 3:00 area, 3 fingerbreadths from the areola. Left diagnostic mammography and  ultrason[facility] at [facility] 06/03/2016 showed only changes of prior excisional biopsies. On physical exam however there was a firm thickening in the lateral left breast, at the site of a prior biopsy. Targeted  ultrasonography of this area (3:00 radiant 5 cm from the nipple) found an irregular hypoechoic region measuring 0.8 cm. This underlying the scar. Ultrasound of the left axilla was benign.  Biopsy of the left breast area in question 06/09/2016 showed (S [id number]) and invasive ductal carcinoma, E-cadherin positive, estrogen receptor 80% positive, progesterone receptor 60% positive, both with strong staining intensity, with an MIB-1 of 15% and no HER-2 amplification, the signals ratio being 1.00 and the number per cell 1.15.  The patient then was further evaluated at [facility] and on 07/12/2016 underwent left lumpectomy and sentinel lymph node sampling, with the final pathology (SP [id number]) finding invasive adenocarcinoma with mixed lobular and ductal features, but E-cadherin positive, grade 2, measuring 1.9 cm. Both sentinel lymph nodes were benign.  An Oncotype DX score was obtained on this sample, and it was 21; the patient was referred to radiation oncology, which was done in [place] and completed 10/08/2016.  The patient's subsequent history is as detailed below.   PAST MEDICAL HISTORY: Past Medical History:  Diagnosis Date   Acute asthmatic bronchitis    Asthma, mild intermittent    Breast cancer ([place]) 08/2012   right-DCIS, +ER/PR   Cystocele    Grade 2   Diverticulosis 2004   Fibroids 1998   Fracture of fibula 2007   left;  hit with golf ball   Heart murmur    nl echo   History of radiation therapy 09/16/2016- 10/08/2016   Left Breast. 42.56 Gy in 16 fractions.    History of TMJ disorder    Hx of radiation therapy 10/11/12- 11/13/12   right breast 4800 cGy 24 sessions   Hypertension    Hypothyroidism    Osteopenia 2002   Raynaud's disease    Shingles    Vasomotor rhinitis     PAST SURGICAL HISTORY: Past Surgical History:  Procedure Laterality Date   ABDOMINAL HYSTERECTOMY     BREAST BIOPSY Left 2015   MRI- Benign   BREAST BIOPSY Right 07/05/2012    Stereo- Malignant   BREAST EXCISIONAL BIOPSY Left    BREAST EXCISIONAL BIOPSY Left    BREAST LUMPECTOMY Right 08/10/12   [facility], NO RESIDUAL DISEASE   HYSTEROSCOPY WITH RESECTOSCOPE  2002   submucous fibroid   INCONTINENCE SURGERY     MINOR BREAST BIOPSY Left    times two   ROTATOR CUFF REPAIR Right 2010   THYROIDECTOMY      FAMILY HISTORY Family History  Problem Relation Age of Onset   Aneurysm Mother    Hypertension Sister    Cancer Sister        tongue   Clotting disorder Maternal Grandfather        liver   Cancer Maternal Grandfather        liver   Breast cancer Neg Hx   The patient's father died of pneumonia at age 47. The patient's mother died at the age of 43 from a ruptured brain aneurysm. The patient had no brothers. The patient had 4 sisters. One sister developed tongue cancer. There is no history of breast or ovarian cancer in the family to her knowledge   GYNECOLOGIC HISTORY:  Patient's last menstrual period was 01/02/2000. Menarche age 17, first live birth age 41, the patient is [place] P2. She stopped  having periods around the year 2000. She used hormone replacement approximately 13 years, until July 2014.   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 Her husband Alexandria Shiflett owns a local [employer] franchise. Son Wille Glaser lives in [place], works in Insurance underwriter. Son Quay Burow is currently [employer]. Incidentally Wille Glaser had a chronic granulomatous disease and received a stem cell transplant from his brother, with excellent results.  His third child is due within the next 2 months.  The patient already has 5 grandchildren. She attends [employer]     ADVANCED DIRECTIVES: In the absence of any documents to the contrary the patient's husband is her healthcare power of attorney   HEALTH MAINTENANCE: Social History   Tobacco Use   Smoking status: Never Smoker   Smokeless tobacco: Never Used  Substance Use Topics   Alcohol use: Yes     Alcohol/week: 6.0 standard drinks    Types: 6 Standard drinks or equivalent per week   Drug use: No     Colonoscopy:  PAP:  Bone density: 12/10/2014 showed a T score of -2.4   Allergies  Allergen Reactions   Pneumovax 23  [Pneumococcal Vac Polyvalent] Swelling    Swollen upper arm   Effexor [Venlafaxine] Nausea And Vomiting   Penicillins Hives   Pneumovax [Pneumococcal Polysaccharide Vaccine] Swelling    Current Outpatient Medications  Medication Sig Dispense Refill   anastrozole (ARIMIDEX) 1 MG tablet Take 1 mg by mouth daily.     cholecalciferol (VITAMIN D) 1000 UNITS tablet Take 1,000 Units by mouth 2 (two) times a week.     clobetasol cream (TEMOVATE) 1.60 % APPLY 1 APPLICATION ON THE SKIN AS DIRECTED  0   Estradiol 10 MCG INST Place vaginally.     levothyroxine (SYNTHROID, LEVOTHROID) 75 MCG tablet Take 75 mcg by mouth. 6 days a week take 1 pill & 1 day a week take 1/2 a pill     loratadine (CLARITIN CHILDRENS) 5 MG chewable tablet Chew 5 mg by mouth daily. Uses as needed     Probiotic Product (PROBIOTIC PO) Take by mouth.     UNABLE TO FIND Testosterone 57m/ml cream     valACYclovir (VALTREX) 500 MG tablet TAKE 1 TABLET BY MOUTH DAILY FOR SUPRESSION, INCREASE TO TWICE DAILY FOR 3 DAYS WITH OUTBREAK 45 tablet 2   zolpidem (AMBIEN) 5 MG tablet Take 5 mg by mouth at bedtime as needed for sleep.     No current facility-administered medications for this visit.     OBJECTIVE: Older white woman in no acute distress  Vitals:   01/03/19 1125  BP: (!) 180/72  Pulse: 69  Resp: 18  Temp: 98.2 F (36.8 C)  SpO2: 100%     Body mass index is 22.48 kg/m.   Wt Readings from Last 3 Encounters:  01/03/19 121 lb 14.4 oz (55.3 kg)  10/27/18 124 lb (56.2 kg)  09/11/18 123 lb (55.8 kg)      ECOG FS:1 - Symptomatic but completely ambulatory  Sclerae unicteric, EOMs intact Wearing a mask No cervical or supraclavicular adenopathy Lungs no rales or rhonchi Heart  regular rate and rhythm Abd soft, nontender, positive bowel sounds MSK no focal spinal tenderness, no upper extremity lymphedema Neuro: nonfocal, well oriented, appropriate affect Breasts: Status post bilateral lumpectomies.  No evidence of disease recurrence.  Both axillae are benign.   LAB RESULTS:  CMP     Component Value Date/Time   NA 141 01/03/2019 1059   NA 142 10/11/2016 1547  K 3.9 01/03/2019 1059   K 4.3 10/11/2016 1547   CL 104 01/03/2019 1059   CO2 27 01/03/2019 1059   CO2 26 10/11/2016 1547   GLUCOSE 72 01/03/2019 1059   GLUCOSE 88 10/11/2016 1547   BUN 18 01/03/2019 1059   BUN 18.5 10/11/2016 1547   CREATININE 0.71 01/03/2019 1059   CREATININE 0.7 10/11/2016 1547   CALCIUM 9.4 01/03/2019 1059   CALCIUM 9.4 10/11/2016 1547   PROT 8.2 (H) 01/03/2019 1059   PROT 7.6 10/11/2016 1547   ALBUMIN 4.0 01/03/2019 1059   ALBUMIN 3.7 10/11/2016 1547   AST 18 01/03/2019 1059   AST 19 10/11/2016 1547   ALT 18 01/03/2019 1059   ALT 12 10/11/2016 1547   ALKPHOS 116 01/03/2019 1059   ALKPHOS 103 10/11/2016 1547   BILITOT 0.3 01/03/2019 1059   BILITOT 0.23 10/11/2016 1547   GFRNONAA >60 01/03/2019 1059   GFRAA >60 01/03/2019 1059    No results found for: Ronnald Ramp, A1GS, A2GS, BETS, BETA2SER, GAMS, MSPIKE, SPEI  No results found for: Nils Pyle, [facility]  Lab Results  Component Value Date   WBC 5.8 01/03/2019   NEUTROABS 3.1 01/03/2019   HGB 13.0 01/03/2019   HCT 41.0 01/03/2019   MCV 96.5 01/03/2019   PLT 195 01/03/2019      Chemistry      Component Value Date/Time   NA 141 01/03/2019 1059   NA 142 10/11/2016 1547   K 3.9 01/03/2019 1059   K 4.3 10/11/2016 1547   CL 104 01/03/2019 1059   CO2 27 01/03/2019 1059   CO2 26 10/11/2016 1547   BUN 18 01/03/2019 1059   BUN 18.5 10/11/2016 1547   CREATININE 0.71 01/03/2019 1059   CREATININE 0.7 10/11/2016 1547      Component Value Date/Time   CALCIUM 9.4 01/03/2019 1059    CALCIUM 9.4 10/11/2016 1547   ALKPHOS 116 01/03/2019 1059   ALKPHOS 103 10/11/2016 1547   AST 18 01/03/2019 1059   AST 19 10/11/2016 1547   ALT 18 01/03/2019 1059   ALT 12 10/11/2016 1547   BILITOT 0.3 01/03/2019 1059   BILITOT 0.23 10/11/2016 1547       No results found for: LABCA2  No components found for: CXKGYJ856  No results for input(s): INR in the last 168 hours.  No results found for: LABCA2  No results found for: DJS970  No results found for: YOV785  No results found for: YIF027  No results found for: CA2729  No components found for: HGQUANT  No results found for: CEA1 / No results found for: CEA1   No results found for: AFPTUMOR  No results found for: CHROMOGRNA  No results found for: PSA1  Appointment on 01/03/2019  Component Date Value Ref Range Status   Sodium 01/03/2019 141  135 - 145 mmol/L Final   Potassium 01/03/2019 3.9  3.5 - 5.1 mmol/L Final   Chloride 01/03/2019 104  98 - 111 mmol/L Final   CO2 01/03/2019 27  22 - 32 mmol/L Final   Glucose, Bld 01/03/2019 72  70 - 99 mg/dL Final   BUN 01/03/2019 18  8 - 23 mg/dL Final   Creatinine 01/03/2019 0.71  0.44 - 1.00 mg/dL Final   Calcium 01/03/2019 9.4  8.9 - 10.3 mg/dL Final   Total Protein 01/03/2019 8.2* 6.5 - 8.1 g/dL Final   Albumin 01/03/2019 4.0  3.5 - 5.0 g/dL Final   AST 01/03/2019 18  15 - 41 U/L Final  ALT 01/03/2019 18  0 - 44 U/L Final   Alkaline Phosphatase 01/03/2019 116  38 - 126 U/L Final   Total Bilirubin 01/03/2019 0.3  0.3 - 1.2 mg/dL Final   GFR, Est Non Af Am 01/03/2019 >60  >60 mL/min Final   GFR, Est AFR Am 01/03/2019 >60  >60 mL/min Final   Anion gap 01/03/2019 10  5 - 15 Final   Performed at [facility], [place] [address], [place], [place] [address]   WBC Count 01/03/2019 5.8  4.0 - 10.5 K/uL Final   RBC 01/03/2019 4.25  3.87 - 5.11 MIL/uL Final   Hemoglobin 01/03/2019 13.0  12.0 - 15.0 g/dL Final   HCT 01/03/2019 41.0   36.0 - 46.0 % Final   MCV 01/03/2019 96.5  80.0 - 100.0 fL Final   MCH 01/03/2019 30.6  26.0 - 34.0 pg Final   MCHC 01/03/2019 31.7  30.0 - 36.0 g/dL Final   RDW 01/03/2019 12.9  11.5 - 15.5 % Final   Platelet Count 01/03/2019 195  150 - 400 K/uL Final   nRBC 01/03/2019 0.0  0.0 - 0.2 % Final   Neutrophils Relative % 01/03/2019 54  % Final   Neutro Abs 01/03/2019 3.1  1.7 - 7.7 K/uL Final   Lymphocytes Relative 01/03/2019 33  % Final   Lymphs Abs 01/03/2019 1.9  0.7 - 4.0 K/uL Final   Monocytes Relative 01/03/2019 10  % Final   Monocytes Absolute 01/03/2019 0.6  0.1 - 1.0 K/uL Final   Eosinophils Relative 01/03/2019 2  % Final   Eosinophils Absolute 01/03/2019 0.1  0.0 - 0.5 K/uL Final   Basophils Relative 01/03/2019 1  % Final   Basophils Absolute 01/03/2019 0.0  0.0 - 0.1 K/uL Final   Immature Granulocytes 01/03/2019 0  % Final   Abs Immature Granulocytes 01/03/2019 0.01  0.00 - 0.07 K/uL Final   Performed at [facility], [place] [address] [place], [place] [address]    (this displays the last labs from the last 3 days)  No results found for: TOTALPROTELP, ALBUMINELP, A1GS, A2GS, BETS, BETA2SER, GAMS, MSPIKE, SPEI (this displays SPEP labs)  No results found for: KPAFRELGTCHN, LAMBDASER, KAPLAMBRATIO (kappa/lambda light chains)  No results found for: HGBA, HGBA2QUANT, HGBFQUANT, HGBSQUAN (Hemoglobinopathy evaluation)   No results found for: LDH  No results found for: IRON, TIBC, IRONPCTSAT (Iron and TIBC)  No results found for: FERRITIN  Urinalysis    Component Value Date/Time   BILIRUBINUR n 04/04/2015 1330   PROTEINUR n 04/04/2015 1330   UROBILINOGEN negative 04/04/2015 1330   NITRITE n 04/04/2015 1330   LEUKOCYTESUR Negative 04/04/2015 1330    STUDIES: No results found.   ELIGIBLE FOR AVAILABLE RESEARCH PROTOCOL: no  ASSESSMENT: 73 y.o. [place] woman  RIGHT BREAST CANCER (1) status post right lumpectomy and  margin clearance 08/10/2012 for ductal carcinoma in situ, grade 2 or 3, estrogen and progesterone receptor positive,  (2) adjuvant radiation to the right breast completed 11/13/2012.  LEFT BREAST CANCER (3) status post left breast biopsy 05/25/2013 for sclerosing adenosis.  (4) left breast biopsy 06/09/2016 showed a clinical T1b N0, stage IA invasive ductal carcinoma (E-cadherin positive) estrogen and progesterone receptor positive, HER-2 nonamplified, with an MIB-1 of 15%.  (5) left lumpectomy and sentinel lymph node sampling 07/12/2016 showed a pT1c pN0, stage IA invasive ductal carcinoma, grade 2, with clear margins.  (6) Oncotype DX score of 21 predicted a 10 year risk of outside the breast  recurrence of 13% if the patient's only systemic therapy was tamoxifen for 5 years. It also predicted no benefit from chemotherapy  (7) adjuvant radiation completed 10/08/2016  (8) started tamoxifen 11/01/2016  (a) tamoxifen discontinued AUG 2020 with hot flashes  (b) anastrozole TIW started August 2020, discontinued NOV 2020  (c) tamoxifen resumed NOV 2020 at 10 mg TIW, to be increased as tolerated  PLAN: [name] [name] has been taking testosterone and estrogen.  This negates the effect of anastrozole.  It may also increase her risk of breast cancer recurrence or the development of a new breast cancer.  She tells me she is aware of this.  Given the advice that she has been receiving from her wellness counselor I thought it would be a good idea to review the way antiestrogens at work in general.  She understands that anastrozole blocks estrogen production in her body.  This does not let breast cells eat estrogen because there would be no estrogen.  If she uses vaginal or any other estrogen, or uses testosterone which can be converted to estrogen by the body, then she is negating the effect of the anastrozole.  Either she gives up the estrogen and testosterone or she gives up the anastrozole.  The alternative  would be to go back on tamoxifen.  Tamoxifen blocks the estrogen receptor in breast cells.  Therefore if some estrogen gets absorbed through the vaginal wall that estrogen still cannot get into breast cells and therefore tamoxifen would still work.  In fact we generally feel comfortable if patient is on tamoxifen use vaginal estrogens.  We reviewed the fact that the reason [name] [name] felt so tired previously had nothing to do with tamoxifen.  She was significantly anemic.  That problem has been taking care of with iron supplementation and her hemoglobin today is 13.  She feels "fine".  After much discussion what she would like to do is stop the testosterone which she does not think is doing her any good; continue the vaginal estrogen as before; and go back to tamoxifen but at a lower dose, 10 mg daily.  Initially she would like to try 10 mg 3 times a week and if she does well with that then she will increase it after a month or so to daily as tolerated.  I have asked her to keep [place] informed of what she does with regards to these drugs.  Assuming all goes well she will be back on tamoxifen 10 mg daily by mid or late January.  She will have a repeat mammogram in June and she will see me again shortly after that  I also strongly urged her to take the coronavirus vaccine as soon as it becomes available.  I spent approximately 40 minutes face to face with [facility] with more than 50% of that time spent in counseling and coordination of care.  She knows to call for any other issue that may develop before the next visit.   [name] [name]. Jennings Stirling, MD  01/03/19 1:16 PM Medical Oncology and Hematology [facility] [address], [place] [address] Tel. [phone]    Fax. [phone]    I, Wilburn Mylar, am acting as scribe for Dr. Virgie [name]. Laurencia Roma.  I, Lurline Del MD, have reviewed the above documentation for accuracy and completeness, and I agree with the above.

## 2019-01-03 ENCOUNTER — Inpatient Hospital Stay: Payer: Medicare Other | Attending: Oncology

## 2019-01-03 ENCOUNTER — Inpatient Hospital Stay (HOSPITAL_BASED_OUTPATIENT_CLINIC_OR_DEPARTMENT_OTHER): Payer: Medicare Other | Admitting: Oncology

## 2019-01-03 ENCOUNTER — Other Ambulatory Visit: Payer: Self-pay

## 2019-01-03 VITALS — BP 180/72 | HR 69 | Temp 98.2°F | Resp 18 | Ht 61.75 in | Wt 121.9 lb

## 2019-01-03 DIAGNOSIS — C50412 Malignant neoplasm of upper-outer quadrant of left female breast: Secondary | ICD-10-CM | POA: Diagnosis not present

## 2019-01-03 DIAGNOSIS — D0511 Intraductal carcinoma in situ of right breast: Secondary | ICD-10-CM | POA: Diagnosis not present

## 2019-01-03 DIAGNOSIS — Z79810 Long term (current) use of selective estrogen receptor modulators (SERMs): Secondary | ICD-10-CM | POA: Insufficient documentation

## 2019-01-03 DIAGNOSIS — C50812 Malignant neoplasm of overlapping sites of left female breast: Secondary | ICD-10-CM | POA: Diagnosis not present

## 2019-01-03 DIAGNOSIS — D649 Anemia, unspecified: Secondary | ICD-10-CM | POA: Insufficient documentation

## 2019-01-03 DIAGNOSIS — Z170 Estrogen receptor positive status [ER+]: Secondary | ICD-10-CM

## 2019-01-03 LAB — CBC WITH DIFFERENTIAL (CANCER CENTER ONLY)
Abs Immature Granulocytes: 0.01 10*3/uL (ref 0.00–0.07)
Basophils Absolute: 0 10*3/uL (ref 0.0–0.1)
Basophils Relative: 1 %
Eosinophils Absolute: 0.1 10*3/uL (ref 0.0–0.5)
Eosinophils Relative: 2 %
HCT: 41 % (ref 36.0–46.0)
Hemoglobin: 13 g/dL (ref 12.0–15.0)
Immature Granulocytes: 0 %
Lymphocytes Relative: 33 %
Lymphs Abs: 1.9 10*3/uL (ref 0.7–4.0)
MCH: 30.6 pg (ref 26.0–34.0)
MCHC: 31.7 g/dL (ref 30.0–36.0)
MCV: 96.5 fL (ref 80.0–100.0)
Monocytes Absolute: 0.6 10*3/uL (ref 0.1–1.0)
Monocytes Relative: 10 %
Neutro Abs: 3.1 10*3/uL (ref 1.7–7.7)
Neutrophils Relative %: 54 %
Platelet Count: 195 10*3/uL (ref 150–400)
RBC: 4.25 MIL/uL (ref 3.87–5.11)
RDW: 12.9 % (ref 11.5–15.5)
WBC Count: 5.8 10*3/uL (ref 4.0–10.5)
nRBC: 0 % (ref 0.0–0.2)

## 2019-01-03 LAB — CMP (CANCER CENTER ONLY)
ALT: 18 U/L (ref 0–44)
AST: 18 U/L (ref 15–41)
Albumin: 4 g/dL (ref 3.5–5.0)
Alkaline Phosphatase: 116 U/L (ref 38–126)
Anion gap: 10 (ref 5–15)
BUN: 18 mg/dL (ref 8–23)
CO2: 27 mmol/L (ref 22–32)
Calcium: 9.4 mg/dL (ref 8.9–10.3)
Chloride: 104 mmol/L (ref 98–111)
Creatinine: 0.71 mg/dL (ref 0.44–1.00)
GFR, Est AFR Am: >60 mL/min (ref >60)
GFR, Estimated: >60 mL/min (ref >60)
Glucose, Bld: 72 mg/dL (ref 70–99)
Potassium: 3.9 mmol/L (ref 3.5–5.1)
Sodium: 141 mmol/L (ref 135–145)
Total Bilirubin: 0.3 mg/dL (ref 0.3–1.2)
Total Protein: 8.2 g/dL — ABNORMAL HIGH (ref 6.5–8.1)

## 2019-01-04 ENCOUNTER — Telehealth: Payer: Self-pay | Admitting: Oncology

## 2019-01-04 NOTE — Telephone Encounter (Signed)
I talk with patient regarding schedule  

## 2019-01-08 DIAGNOSIS — J45909 Unspecified asthma, uncomplicated: Secondary | ICD-10-CM | POA: Diagnosis not present

## 2019-01-08 DIAGNOSIS — D649 Anemia, unspecified: Secondary | ICD-10-CM | POA: Diagnosis not present

## 2019-01-08 DIAGNOSIS — M109 Gout, unspecified: Secondary | ICD-10-CM | POA: Diagnosis not present

## 2019-01-08 DIAGNOSIS — M8580 Other specified disorders of bone density and structure, unspecified site: Secondary | ICD-10-CM | POA: Diagnosis not present

## 2019-01-08 DIAGNOSIS — R0602 Shortness of breath: Secondary | ICD-10-CM | POA: Diagnosis not present

## 2019-01-08 DIAGNOSIS — I10 Essential (primary) hypertension: Secondary | ICD-10-CM | POA: Diagnosis not present

## 2019-01-08 DIAGNOSIS — Z8679 Personal history of other diseases of the circulatory system: Secondary | ICD-10-CM | POA: Diagnosis not present

## 2019-01-08 DIAGNOSIS — E538 Deficiency of other specified B group vitamins: Secondary | ICD-10-CM | POA: Diagnosis not present

## 2019-01-08 DIAGNOSIS — M25511 Pain in right shoulder: Secondary | ICD-10-CM | POA: Diagnosis not present

## 2019-01-08 DIAGNOSIS — E890 Postprocedural hypothyroidism: Secondary | ICD-10-CM | POA: Diagnosis not present

## 2019-01-08 DIAGNOSIS — C50919 Malignant neoplasm of unspecified site of unspecified female breast: Secondary | ICD-10-CM | POA: Diagnosis not present

## 2019-01-08 DIAGNOSIS — E785 Hyperlipidemia, unspecified: Secondary | ICD-10-CM | POA: Diagnosis not present

## 2019-01-14 IMAGING — DX DG CHEST 2V
2 series · 2 of 2 positions shown · non-contrast
Comparison: 02/22/2013

CLINICAL DATA: Persistent cough for 3 weeks

EXAM:
CHEST  2 VIEW

[chest pa]
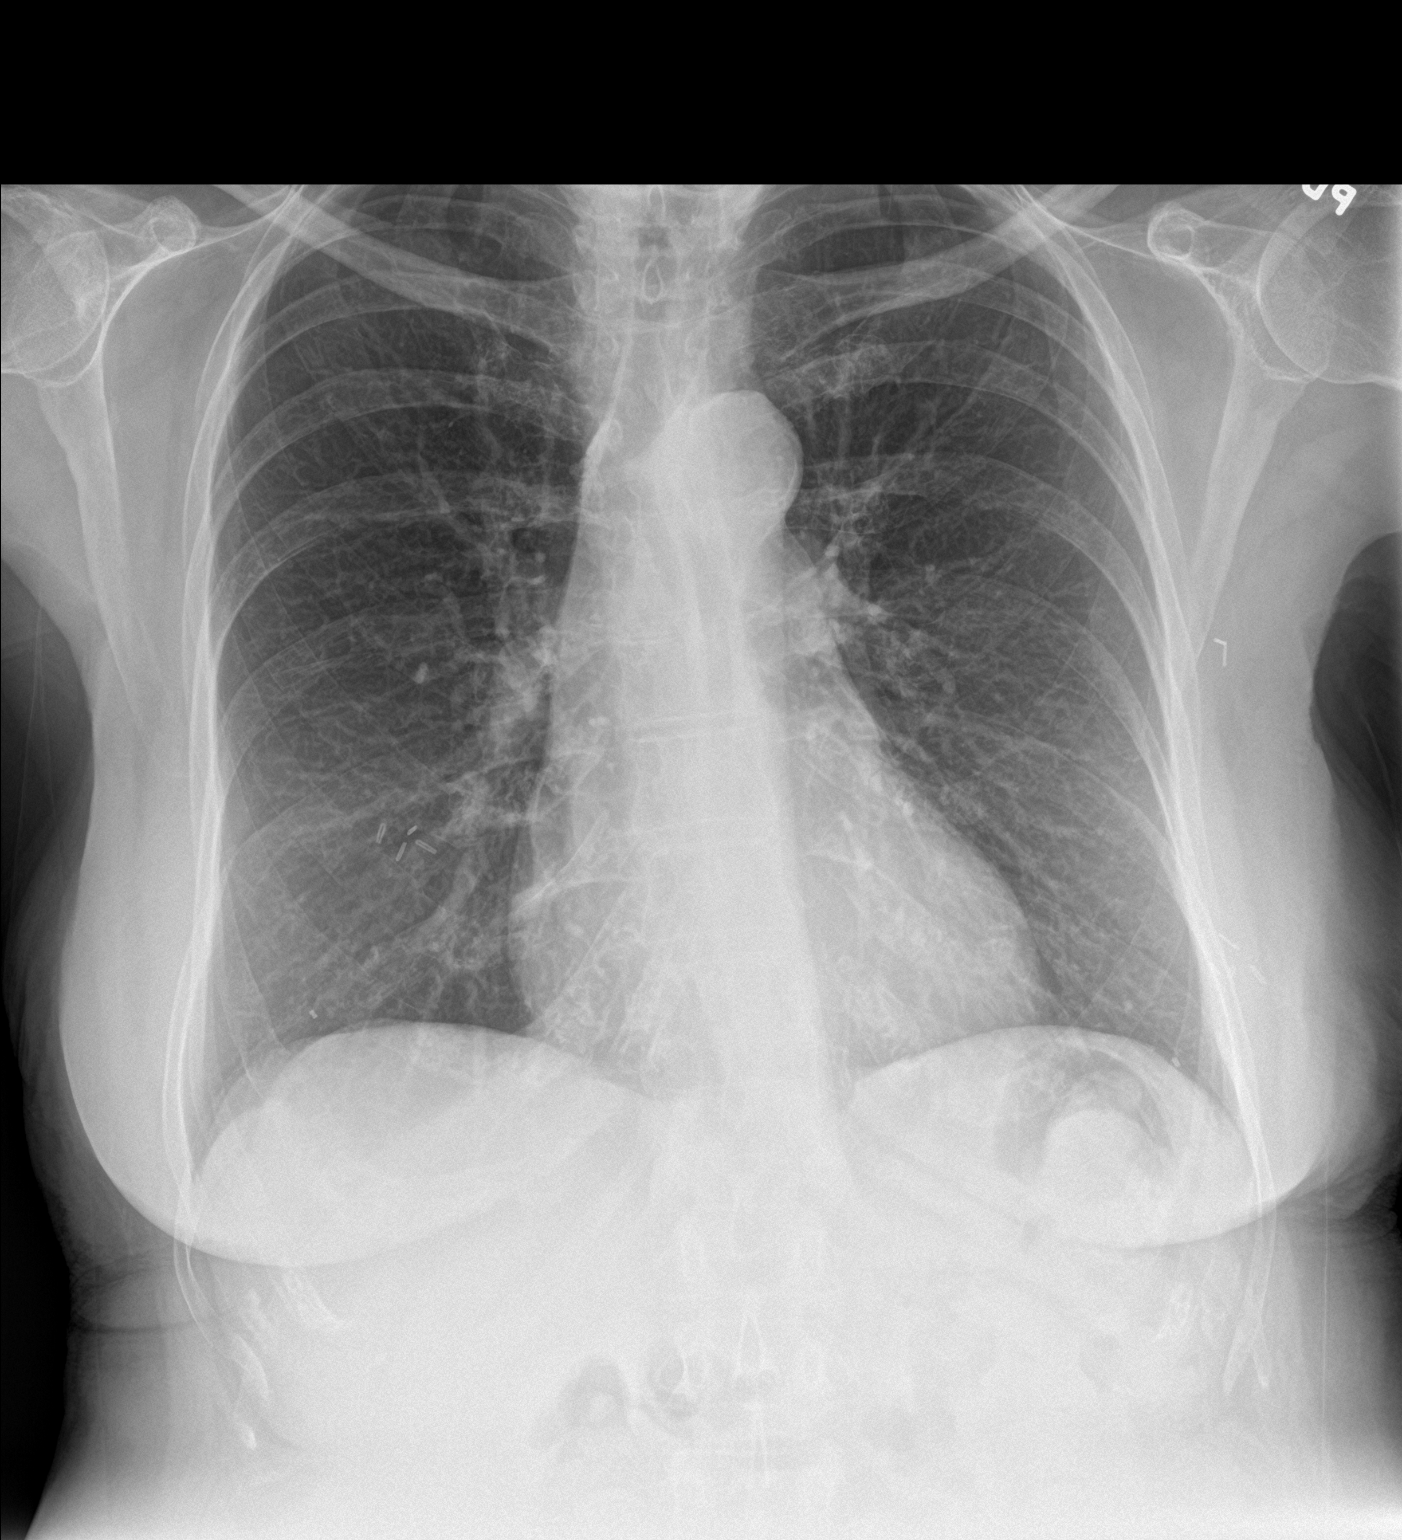

[chest lat]
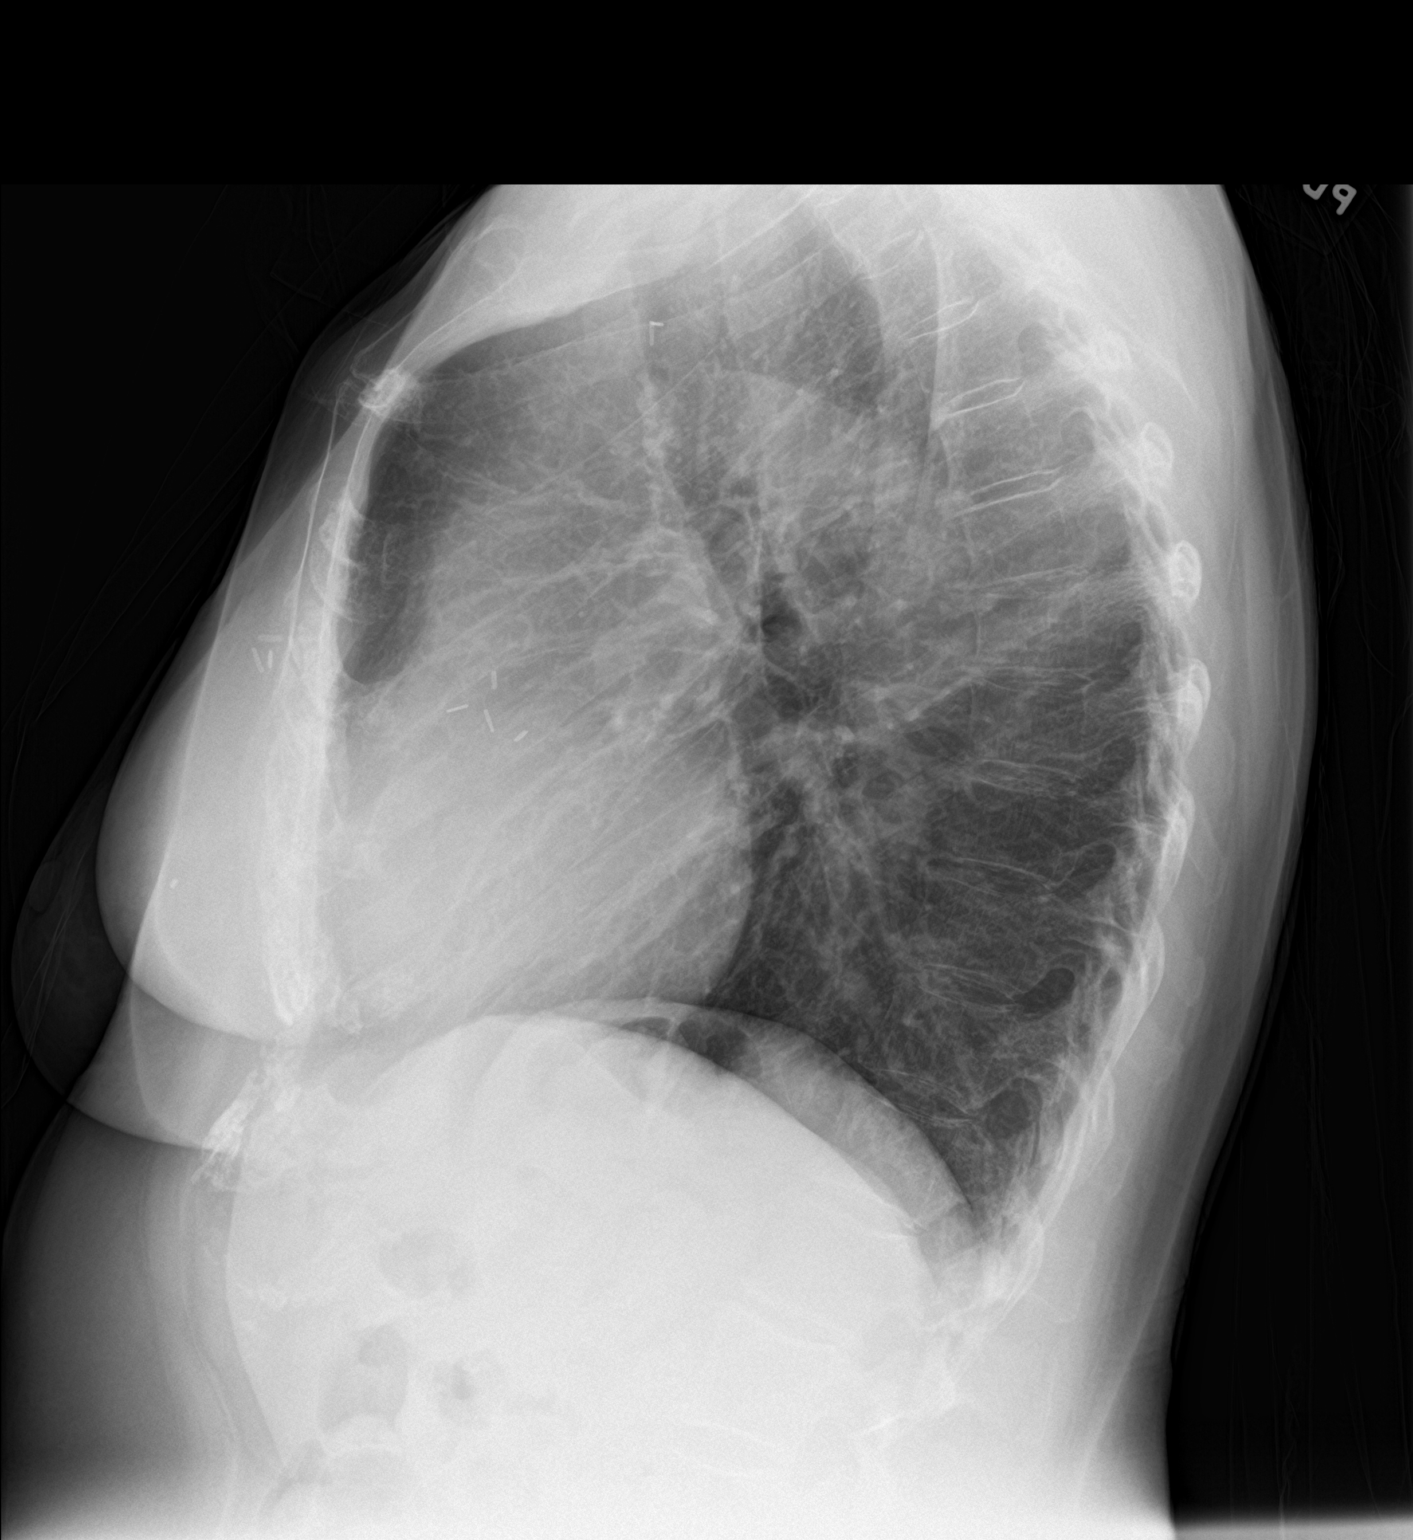

[2 of 2 positions shown; findings below may reference images not displayed]

FINDINGS: Heart and mediastinal contours are within normal limits. No focal
opacities or effusions. No acute bony abnormality.
IMPRESSION: No active cardiopulmonary disease.

## 2019-01-16 DIAGNOSIS — M25511 Pain in right shoulder: Secondary | ICD-10-CM | POA: Diagnosis not present

## 2019-01-16 DIAGNOSIS — M19111 Post-traumatic osteoarthritis, right shoulder: Secondary | ICD-10-CM | POA: Diagnosis not present

## 2019-02-06 DIAGNOSIS — M7541 Impingement syndrome of right shoulder: Secondary | ICD-10-CM | POA: Diagnosis not present

## 2019-02-13 DIAGNOSIS — M7541 Impingement syndrome of right shoulder: Secondary | ICD-10-CM | POA: Diagnosis not present

## 2019-02-16 DIAGNOSIS — M75121 Complete rotator cuff tear or rupture of right shoulder, not specified as traumatic: Secondary | ICD-10-CM | POA: Diagnosis not present

## 2019-02-16 DIAGNOSIS — M25511 Pain in right shoulder: Secondary | ICD-10-CM | POA: Diagnosis not present

## 2019-03-02 DIAGNOSIS — Z20822 Contact with and (suspected) exposure to covid-19: Secondary | ICD-10-CM | POA: Diagnosis not present

## 2019-03-02 DIAGNOSIS — Z01811 Encounter for preprocedural respiratory examination: Secondary | ICD-10-CM | POA: Diagnosis not present

## 2019-03-02 DIAGNOSIS — M25511 Pain in right shoulder: Secondary | ICD-10-CM | POA: Diagnosis not present

## 2019-03-02 DIAGNOSIS — Z538 Procedure and treatment not carried out for other reasons: Secondary | ICD-10-CM | POA: Diagnosis not present

## 2019-03-02 DIAGNOSIS — Z880 Allergy status to penicillin: Secondary | ICD-10-CM | POA: Diagnosis not present

## 2019-03-02 DIAGNOSIS — Z01810 Encounter for preprocedural cardiovascular examination: Secondary | ICD-10-CM | POA: Diagnosis not present

## 2019-03-02 DIAGNOSIS — M75121 Complete rotator cuff tear or rupture of right shoulder, not specified as traumatic: Secondary | ICD-10-CM | POA: Diagnosis not present

## 2019-03-02 DIAGNOSIS — M19011 Primary osteoarthritis, right shoulder: Secondary | ICD-10-CM | POA: Diagnosis not present

## 2019-03-02 DIAGNOSIS — Z887 Allergy status to serum and vaccine status: Secondary | ICD-10-CM | POA: Diagnosis not present

## 2019-03-02 DIAGNOSIS — Z01818 Encounter for other preprocedural examination: Secondary | ICD-10-CM | POA: Diagnosis not present

## 2019-03-05 HISTORY — PX: OTHER SURGICAL HISTORY: SHX169

## 2019-03-07 DIAGNOSIS — Z6822 Body mass index (BMI) 22.0-22.9, adult: Secondary | ICD-10-CM | POA: Diagnosis not present

## 2019-03-07 DIAGNOSIS — M25511 Pain in right shoulder: Secondary | ICD-10-CM | POA: Diagnosis not present

## 2019-03-07 DIAGNOSIS — Z853 Personal history of malignant neoplasm of breast: Secondary | ICD-10-CM | POA: Diagnosis not present

## 2019-03-07 DIAGNOSIS — R011 Cardiac murmur, unspecified: Secondary | ICD-10-CM | POA: Diagnosis not present

## 2019-03-07 DIAGNOSIS — E039 Hypothyroidism, unspecified: Secondary | ICD-10-CM | POA: Diagnosis not present

## 2019-03-13 DIAGNOSIS — N390 Urinary tract infection, site not specified: Secondary | ICD-10-CM | POA: Diagnosis not present

## 2019-03-15 DIAGNOSIS — Z029 Encounter for administrative examinations, unspecified: Secondary | ICD-10-CM | POA: Diagnosis not present

## 2019-03-15 DIAGNOSIS — Z880 Allergy status to penicillin: Secondary | ICD-10-CM | POA: Diagnosis not present

## 2019-03-15 DIAGNOSIS — R110 Nausea: Secondary | ICD-10-CM | POA: Diagnosis not present

## 2019-03-15 DIAGNOSIS — J45909 Unspecified asthma, uncomplicated: Secondary | ICD-10-CM | POA: Diagnosis not present

## 2019-03-15 DIAGNOSIS — Z90710 Acquired absence of both cervix and uterus: Secondary | ICD-10-CM | POA: Diagnosis not present

## 2019-03-15 DIAGNOSIS — Z20822 Contact with and (suspected) exposure to covid-19: Secondary | ICD-10-CM | POA: Diagnosis not present

## 2019-03-15 DIAGNOSIS — M7500 Adhesive capsulitis of unspecified shoulder: Secondary | ICD-10-CM | POA: Diagnosis not present

## 2019-03-15 DIAGNOSIS — Z853 Personal history of malignant neoplasm of breast: Secondary | ICD-10-CM | POA: Diagnosis not present

## 2019-03-15 DIAGNOSIS — L930 Discoid lupus erythematosus: Secondary | ICD-10-CM | POA: Diagnosis not present

## 2019-03-15 DIAGNOSIS — R42 Dizziness and giddiness: Secondary | ICD-10-CM | POA: Diagnosis not present

## 2019-03-15 DIAGNOSIS — M75111 Incomplete rotator cuff tear or rupture of right shoulder, not specified as traumatic: Secondary | ICD-10-CM | POA: Diagnosis not present

## 2019-03-15 DIAGNOSIS — E039 Hypothyroidism, unspecified: Secondary | ICD-10-CM | POA: Diagnosis not present

## 2019-03-15 DIAGNOSIS — Z79890 Hormone replacement therapy (postmenopausal): Secondary | ICD-10-CM | POA: Diagnosis not present

## 2019-03-15 DIAGNOSIS — M19011 Primary osteoarthritis, right shoulder: Secondary | ICD-10-CM | POA: Diagnosis not present

## 2019-03-20 DIAGNOSIS — Z853 Personal history of malignant neoplasm of breast: Secondary | ICD-10-CM | POA: Diagnosis not present

## 2019-03-20 DIAGNOSIS — Z471 Aftercare following joint replacement surgery: Secondary | ICD-10-CM | POA: Diagnosis not present

## 2019-03-20 DIAGNOSIS — Z880 Allergy status to penicillin: Secondary | ICD-10-CM | POA: Diagnosis not present

## 2019-03-20 DIAGNOSIS — L930 Discoid lupus erythematosus: Secondary | ICD-10-CM | POA: Diagnosis not present

## 2019-03-20 DIAGNOSIS — M7500 Adhesive capsulitis of unspecified shoulder: Secondary | ICD-10-CM | POA: Diagnosis present

## 2019-03-20 DIAGNOSIS — R110 Nausea: Secondary | ICD-10-CM | POA: Diagnosis not present

## 2019-03-20 DIAGNOSIS — Z79890 Hormone replacement therapy (postmenopausal): Secondary | ICD-10-CM | POA: Diagnosis not present

## 2019-03-20 DIAGNOSIS — M7541 Impingement syndrome of right shoulder: Secondary | ICD-10-CM | POA: Diagnosis not present

## 2019-03-20 DIAGNOSIS — J45909 Unspecified asthma, uncomplicated: Secondary | ICD-10-CM | POA: Diagnosis not present

## 2019-03-20 DIAGNOSIS — Z20822 Contact with and (suspected) exposure to covid-19: Secondary | ICD-10-CM | POA: Diagnosis not present

## 2019-03-20 DIAGNOSIS — Z029 Encounter for administrative examinations, unspecified: Secondary | ICD-10-CM | POA: Diagnosis not present

## 2019-03-20 DIAGNOSIS — R42 Dizziness and giddiness: Secondary | ICD-10-CM | POA: Diagnosis not present

## 2019-03-20 DIAGNOSIS — Z90710 Acquired absence of both cervix and uterus: Secondary | ICD-10-CM | POA: Diagnosis not present

## 2019-03-20 DIAGNOSIS — M75111 Incomplete rotator cuff tear or rupture of right shoulder, not specified as traumatic: Secondary | ICD-10-CM | POA: Diagnosis not present

## 2019-03-20 DIAGNOSIS — Z96611 Presence of right artificial shoulder joint: Secondary | ICD-10-CM | POA: Diagnosis not present

## 2019-03-20 DIAGNOSIS — E039 Hypothyroidism, unspecified: Secondary | ICD-10-CM | POA: Diagnosis not present

## 2019-03-20 DIAGNOSIS — M19011 Primary osteoarthritis, right shoulder: Secondary | ICD-10-CM | POA: Diagnosis not present

## 2019-03-22 DIAGNOSIS — Z96611 Presence of right artificial shoulder joint: Secondary | ICD-10-CM | POA: Diagnosis not present

## 2019-03-22 DIAGNOSIS — Z79891 Long term (current) use of opiate analgesic: Secondary | ICD-10-CM | POA: Diagnosis not present

## 2019-03-22 DIAGNOSIS — Z79899 Other long term (current) drug therapy: Secondary | ICD-10-CM | POA: Diagnosis not present

## 2019-03-22 DIAGNOSIS — Z7951 Long term (current) use of inhaled steroids: Secondary | ICD-10-CM | POA: Diagnosis not present

## 2019-03-22 DIAGNOSIS — E079 Disorder of thyroid, unspecified: Secondary | ICD-10-CM | POA: Diagnosis not present

## 2019-03-22 DIAGNOSIS — I7300 Raynaud's syndrome without gangrene: Secondary | ICD-10-CM | POA: Diagnosis not present

## 2019-03-22 DIAGNOSIS — Z471 Aftercare following joint replacement surgery: Secondary | ICD-10-CM | POA: Diagnosis not present

## 2019-03-22 DIAGNOSIS — K5792 Diverticulitis of intestine, part unspecified, without perforation or abscess without bleeding: Secondary | ICD-10-CM | POA: Diagnosis not present

## 2019-03-24 DIAGNOSIS — E079 Disorder of thyroid, unspecified: Secondary | ICD-10-CM | POA: Diagnosis not present

## 2019-03-24 DIAGNOSIS — Z79891 Long term (current) use of opiate analgesic: Secondary | ICD-10-CM | POA: Diagnosis not present

## 2019-03-24 DIAGNOSIS — K5792 Diverticulitis of intestine, part unspecified, without perforation or abscess without bleeding: Secondary | ICD-10-CM | POA: Diagnosis not present

## 2019-03-24 DIAGNOSIS — Z96611 Presence of right artificial shoulder joint: Secondary | ICD-10-CM | POA: Diagnosis not present

## 2019-03-24 DIAGNOSIS — I7300 Raynaud's syndrome without gangrene: Secondary | ICD-10-CM | POA: Diagnosis not present

## 2019-03-24 DIAGNOSIS — Z471 Aftercare following joint replacement surgery: Secondary | ICD-10-CM | POA: Diagnosis not present

## 2019-03-26 DIAGNOSIS — K5792 Diverticulitis of intestine, part unspecified, without perforation or abscess without bleeding: Secondary | ICD-10-CM | POA: Diagnosis not present

## 2019-03-26 DIAGNOSIS — Z79891 Long term (current) use of opiate analgesic: Secondary | ICD-10-CM | POA: Diagnosis not present

## 2019-03-26 DIAGNOSIS — Z471 Aftercare following joint replacement surgery: Secondary | ICD-10-CM | POA: Diagnosis not present

## 2019-03-26 DIAGNOSIS — I7300 Raynaud's syndrome without gangrene: Secondary | ICD-10-CM | POA: Diagnosis not present

## 2019-03-26 DIAGNOSIS — Z96611 Presence of right artificial shoulder joint: Secondary | ICD-10-CM | POA: Diagnosis not present

## 2019-03-26 DIAGNOSIS — E079 Disorder of thyroid, unspecified: Secondary | ICD-10-CM | POA: Diagnosis not present

## 2019-03-28 DIAGNOSIS — Z79891 Long term (current) use of opiate analgesic: Secondary | ICD-10-CM | POA: Diagnosis not present

## 2019-03-28 DIAGNOSIS — E079 Disorder of thyroid, unspecified: Secondary | ICD-10-CM | POA: Diagnosis not present

## 2019-03-28 DIAGNOSIS — Z471 Aftercare following joint replacement surgery: Secondary | ICD-10-CM | POA: Diagnosis not present

## 2019-03-28 DIAGNOSIS — Z96611 Presence of right artificial shoulder joint: Secondary | ICD-10-CM | POA: Diagnosis not present

## 2019-03-28 DIAGNOSIS — I7300 Raynaud's syndrome without gangrene: Secondary | ICD-10-CM | POA: Diagnosis not present

## 2019-03-28 DIAGNOSIS — K5792 Diverticulitis of intestine, part unspecified, without perforation or abscess without bleeding: Secondary | ICD-10-CM | POA: Diagnosis not present

## 2019-03-30 DIAGNOSIS — Z79891 Long term (current) use of opiate analgesic: Secondary | ICD-10-CM | POA: Diagnosis not present

## 2019-03-30 DIAGNOSIS — Z471 Aftercare following joint replacement surgery: Secondary | ICD-10-CM | POA: Diagnosis not present

## 2019-03-30 DIAGNOSIS — E079 Disorder of thyroid, unspecified: Secondary | ICD-10-CM | POA: Diagnosis not present

## 2019-03-30 DIAGNOSIS — Z96611 Presence of right artificial shoulder joint: Secondary | ICD-10-CM | POA: Diagnosis not present

## 2019-03-30 DIAGNOSIS — K5792 Diverticulitis of intestine, part unspecified, without perforation or abscess without bleeding: Secondary | ICD-10-CM | POA: Diagnosis not present

## 2019-03-30 DIAGNOSIS — I7300 Raynaud's syndrome without gangrene: Secondary | ICD-10-CM | POA: Diagnosis not present

## 2019-04-02 DIAGNOSIS — K5792 Diverticulitis of intestine, part unspecified, without perforation or abscess without bleeding: Secondary | ICD-10-CM | POA: Diagnosis not present

## 2019-04-02 DIAGNOSIS — Z79891 Long term (current) use of opiate analgesic: Secondary | ICD-10-CM | POA: Diagnosis not present

## 2019-04-02 DIAGNOSIS — I7300 Raynaud's syndrome without gangrene: Secondary | ICD-10-CM | POA: Diagnosis not present

## 2019-04-02 DIAGNOSIS — Z471 Aftercare following joint replacement surgery: Secondary | ICD-10-CM | POA: Diagnosis not present

## 2019-04-02 DIAGNOSIS — Z96611 Presence of right artificial shoulder joint: Secondary | ICD-10-CM | POA: Diagnosis not present

## 2019-04-02 DIAGNOSIS — E079 Disorder of thyroid, unspecified: Secondary | ICD-10-CM | POA: Diagnosis not present

## 2019-04-04 DIAGNOSIS — Z471 Aftercare following joint replacement surgery: Secondary | ICD-10-CM | POA: Diagnosis not present

## 2019-04-04 DIAGNOSIS — Z96611 Presence of right artificial shoulder joint: Secondary | ICD-10-CM | POA: Diagnosis not present

## 2019-04-09 DIAGNOSIS — I7300 Raynaud's syndrome without gangrene: Secondary | ICD-10-CM | POA: Diagnosis not present

## 2019-04-09 DIAGNOSIS — Z96611 Presence of right artificial shoulder joint: Secondary | ICD-10-CM | POA: Diagnosis not present

## 2019-04-09 DIAGNOSIS — Z471 Aftercare following joint replacement surgery: Secondary | ICD-10-CM | POA: Diagnosis not present

## 2019-04-09 DIAGNOSIS — Z79891 Long term (current) use of opiate analgesic: Secondary | ICD-10-CM | POA: Diagnosis not present

## 2019-04-09 DIAGNOSIS — E079 Disorder of thyroid, unspecified: Secondary | ICD-10-CM | POA: Diagnosis not present

## 2019-04-09 DIAGNOSIS — K5792 Diverticulitis of intestine, part unspecified, without perforation or abscess without bleeding: Secondary | ICD-10-CM | POA: Diagnosis not present

## 2019-04-11 DIAGNOSIS — Z79891 Long term (current) use of opiate analgesic: Secondary | ICD-10-CM | POA: Diagnosis not present

## 2019-04-11 DIAGNOSIS — Z471 Aftercare following joint replacement surgery: Secondary | ICD-10-CM | POA: Diagnosis not present

## 2019-04-11 DIAGNOSIS — I7300 Raynaud's syndrome without gangrene: Secondary | ICD-10-CM | POA: Diagnosis not present

## 2019-04-11 DIAGNOSIS — E079 Disorder of thyroid, unspecified: Secondary | ICD-10-CM | POA: Diagnosis not present

## 2019-04-11 DIAGNOSIS — Z96611 Presence of right artificial shoulder joint: Secondary | ICD-10-CM | POA: Diagnosis not present

## 2019-04-11 DIAGNOSIS — K5792 Diverticulitis of intestine, part unspecified, without perforation or abscess without bleeding: Secondary | ICD-10-CM | POA: Diagnosis not present

## 2019-04-13 DIAGNOSIS — K5792 Diverticulitis of intestine, part unspecified, without perforation or abscess without bleeding: Secondary | ICD-10-CM | POA: Diagnosis not present

## 2019-04-13 DIAGNOSIS — I7300 Raynaud's syndrome without gangrene: Secondary | ICD-10-CM | POA: Diagnosis not present

## 2019-04-13 DIAGNOSIS — Z96611 Presence of right artificial shoulder joint: Secondary | ICD-10-CM | POA: Diagnosis not present

## 2019-04-13 DIAGNOSIS — Z79891 Long term (current) use of opiate analgesic: Secondary | ICD-10-CM | POA: Diagnosis not present

## 2019-04-13 DIAGNOSIS — E079 Disorder of thyroid, unspecified: Secondary | ICD-10-CM | POA: Diagnosis not present

## 2019-04-13 DIAGNOSIS — Z471 Aftercare following joint replacement surgery: Secondary | ICD-10-CM | POA: Diagnosis not present

## 2019-04-16 DIAGNOSIS — E079 Disorder of thyroid, unspecified: Secondary | ICD-10-CM | POA: Diagnosis not present

## 2019-04-16 DIAGNOSIS — Z79891 Long term (current) use of opiate analgesic: Secondary | ICD-10-CM | POA: Diagnosis not present

## 2019-04-16 DIAGNOSIS — K5792 Diverticulitis of intestine, part unspecified, without perforation or abscess without bleeding: Secondary | ICD-10-CM | POA: Diagnosis not present

## 2019-04-16 DIAGNOSIS — I7300 Raynaud's syndrome without gangrene: Secondary | ICD-10-CM | POA: Diagnosis not present

## 2019-04-16 DIAGNOSIS — Z471 Aftercare following joint replacement surgery: Secondary | ICD-10-CM | POA: Diagnosis not present

## 2019-04-16 DIAGNOSIS — Z96611 Presence of right artificial shoulder joint: Secondary | ICD-10-CM | POA: Diagnosis not present

## 2019-04-18 DIAGNOSIS — Z96611 Presence of right artificial shoulder joint: Secondary | ICD-10-CM | POA: Diagnosis not present

## 2019-04-18 DIAGNOSIS — Z79891 Long term (current) use of opiate analgesic: Secondary | ICD-10-CM | POA: Diagnosis not present

## 2019-04-18 DIAGNOSIS — Z471 Aftercare following joint replacement surgery: Secondary | ICD-10-CM | POA: Diagnosis not present

## 2019-04-18 DIAGNOSIS — I7300 Raynaud's syndrome without gangrene: Secondary | ICD-10-CM | POA: Diagnosis not present

## 2019-04-18 DIAGNOSIS — E079 Disorder of thyroid, unspecified: Secondary | ICD-10-CM | POA: Diagnosis not present

## 2019-04-18 DIAGNOSIS — K5792 Diverticulitis of intestine, part unspecified, without perforation or abscess without bleeding: Secondary | ICD-10-CM | POA: Diagnosis not present

## 2019-04-20 DIAGNOSIS — Z79891 Long term (current) use of opiate analgesic: Secondary | ICD-10-CM | POA: Diagnosis not present

## 2019-04-20 DIAGNOSIS — E079 Disorder of thyroid, unspecified: Secondary | ICD-10-CM | POA: Diagnosis not present

## 2019-04-20 DIAGNOSIS — I7300 Raynaud's syndrome without gangrene: Secondary | ICD-10-CM | POA: Diagnosis not present

## 2019-04-20 DIAGNOSIS — K5792 Diverticulitis of intestine, part unspecified, without perforation or abscess without bleeding: Secondary | ICD-10-CM | POA: Diagnosis not present

## 2019-04-20 DIAGNOSIS — Z471 Aftercare following joint replacement surgery: Secondary | ICD-10-CM | POA: Diagnosis not present

## 2019-04-20 DIAGNOSIS — Z96611 Presence of right artificial shoulder joint: Secondary | ICD-10-CM | POA: Diagnosis not present

## 2019-04-21 DIAGNOSIS — I7300 Raynaud's syndrome without gangrene: Secondary | ICD-10-CM | POA: Diagnosis not present

## 2019-04-21 DIAGNOSIS — Z79899 Other long term (current) drug therapy: Secondary | ICD-10-CM | POA: Diagnosis not present

## 2019-04-21 DIAGNOSIS — Z96611 Presence of right artificial shoulder joint: Secondary | ICD-10-CM | POA: Diagnosis not present

## 2019-04-21 DIAGNOSIS — Z471 Aftercare following joint replacement surgery: Secondary | ICD-10-CM | POA: Diagnosis not present

## 2019-04-21 DIAGNOSIS — Z79891 Long term (current) use of opiate analgesic: Secondary | ICD-10-CM | POA: Diagnosis not present

## 2019-04-21 DIAGNOSIS — Z7951 Long term (current) use of inhaled steroids: Secondary | ICD-10-CM | POA: Diagnosis not present

## 2019-04-21 DIAGNOSIS — K5792 Diverticulitis of intestine, part unspecified, without perforation or abscess without bleeding: Secondary | ICD-10-CM | POA: Diagnosis not present

## 2019-04-21 DIAGNOSIS — E079 Disorder of thyroid, unspecified: Secondary | ICD-10-CM | POA: Diagnosis not present

## 2019-04-23 DIAGNOSIS — I7300 Raynaud's syndrome without gangrene: Secondary | ICD-10-CM | POA: Diagnosis not present

## 2019-04-23 DIAGNOSIS — Z79891 Long term (current) use of opiate analgesic: Secondary | ICD-10-CM | POA: Diagnosis not present

## 2019-04-23 DIAGNOSIS — K5792 Diverticulitis of intestine, part unspecified, without perforation or abscess without bleeding: Secondary | ICD-10-CM | POA: Diagnosis not present

## 2019-04-23 DIAGNOSIS — Z96611 Presence of right artificial shoulder joint: Secondary | ICD-10-CM | POA: Diagnosis not present

## 2019-04-23 DIAGNOSIS — E079 Disorder of thyroid, unspecified: Secondary | ICD-10-CM | POA: Diagnosis not present

## 2019-04-23 DIAGNOSIS — Z471 Aftercare following joint replacement surgery: Secondary | ICD-10-CM | POA: Diagnosis not present

## 2019-04-24 ENCOUNTER — Encounter: Payer: Self-pay | Admitting: Certified Nurse Midwife

## 2019-04-25 DIAGNOSIS — E079 Disorder of thyroid, unspecified: Secondary | ICD-10-CM | POA: Diagnosis not present

## 2019-04-25 DIAGNOSIS — K5792 Diverticulitis of intestine, part unspecified, without perforation or abscess without bleeding: Secondary | ICD-10-CM | POA: Diagnosis not present

## 2019-04-25 DIAGNOSIS — Z79891 Long term (current) use of opiate analgesic: Secondary | ICD-10-CM | POA: Diagnosis not present

## 2019-04-25 DIAGNOSIS — Z96611 Presence of right artificial shoulder joint: Secondary | ICD-10-CM | POA: Diagnosis not present

## 2019-04-25 DIAGNOSIS — I7300 Raynaud's syndrome without gangrene: Secondary | ICD-10-CM | POA: Diagnosis not present

## 2019-04-25 DIAGNOSIS — Z471 Aftercare following joint replacement surgery: Secondary | ICD-10-CM | POA: Diagnosis not present

## 2019-04-30 DIAGNOSIS — Z471 Aftercare following joint replacement surgery: Secondary | ICD-10-CM | POA: Diagnosis not present

## 2019-04-30 DIAGNOSIS — I7300 Raynaud's syndrome without gangrene: Secondary | ICD-10-CM | POA: Diagnosis not present

## 2019-04-30 DIAGNOSIS — E079 Disorder of thyroid, unspecified: Secondary | ICD-10-CM | POA: Diagnosis not present

## 2019-04-30 DIAGNOSIS — Z79891 Long term (current) use of opiate analgesic: Secondary | ICD-10-CM | POA: Diagnosis not present

## 2019-04-30 DIAGNOSIS — Z96611 Presence of right artificial shoulder joint: Secondary | ICD-10-CM | POA: Diagnosis not present

## 2019-04-30 DIAGNOSIS — K5792 Diverticulitis of intestine, part unspecified, without perforation or abscess without bleeding: Secondary | ICD-10-CM | POA: Diagnosis not present

## 2019-05-04 DIAGNOSIS — Z96611 Presence of right artificial shoulder joint: Secondary | ICD-10-CM | POA: Diagnosis not present

## 2019-05-04 DIAGNOSIS — E079 Disorder of thyroid, unspecified: Secondary | ICD-10-CM | POA: Diagnosis not present

## 2019-05-04 DIAGNOSIS — Z471 Aftercare following joint replacement surgery: Secondary | ICD-10-CM | POA: Diagnosis not present

## 2019-05-04 DIAGNOSIS — K5792 Diverticulitis of intestine, part unspecified, without perforation or abscess without bleeding: Secondary | ICD-10-CM | POA: Diagnosis not present

## 2019-05-04 DIAGNOSIS — I7300 Raynaud's syndrome without gangrene: Secondary | ICD-10-CM | POA: Diagnosis not present

## 2019-05-04 DIAGNOSIS — Z79891 Long term (current) use of opiate analgesic: Secondary | ICD-10-CM | POA: Diagnosis not present

## 2019-05-07 DIAGNOSIS — Z96611 Presence of right artificial shoulder joint: Secondary | ICD-10-CM | POA: Diagnosis not present

## 2019-05-07 DIAGNOSIS — Z79891 Long term (current) use of opiate analgesic: Secondary | ICD-10-CM | POA: Diagnosis not present

## 2019-05-07 DIAGNOSIS — E079 Disorder of thyroid, unspecified: Secondary | ICD-10-CM | POA: Diagnosis not present

## 2019-05-07 DIAGNOSIS — K5792 Diverticulitis of intestine, part unspecified, without perforation or abscess without bleeding: Secondary | ICD-10-CM | POA: Diagnosis not present

## 2019-05-07 DIAGNOSIS — I7300 Raynaud's syndrome without gangrene: Secondary | ICD-10-CM | POA: Diagnosis not present

## 2019-05-07 DIAGNOSIS — Z471 Aftercare following joint replacement surgery: Secondary | ICD-10-CM | POA: Diagnosis not present

## 2019-05-08 DIAGNOSIS — M4312 Spondylolisthesis, cervical region: Secondary | ICD-10-CM | POA: Diagnosis not present

## 2019-05-11 DIAGNOSIS — Z96611 Presence of right artificial shoulder joint: Secondary | ICD-10-CM | POA: Diagnosis not present

## 2019-05-11 DIAGNOSIS — Z471 Aftercare following joint replacement surgery: Secondary | ICD-10-CM | POA: Diagnosis not present

## 2019-05-14 DIAGNOSIS — G5601 Carpal tunnel syndrome, right upper limb: Secondary | ICD-10-CM | POA: Diagnosis not present

## 2019-05-14 DIAGNOSIS — M25541 Pain in joints of right hand: Secondary | ICD-10-CM | POA: Diagnosis not present

## 2019-05-15 ENCOUNTER — Encounter: Payer: Self-pay | Admitting: Oncology

## 2019-05-15 ENCOUNTER — Other Ambulatory Visit: Payer: Self-pay | Admitting: Oncology

## 2019-05-15 DIAGNOSIS — C50512 Malignant neoplasm of lower-outer quadrant of left female breast: Secondary | ICD-10-CM

## 2019-05-16 DIAGNOSIS — Z471 Aftercare following joint replacement surgery: Secondary | ICD-10-CM | POA: Diagnosis not present

## 2019-05-16 DIAGNOSIS — Z96611 Presence of right artificial shoulder joint: Secondary | ICD-10-CM | POA: Diagnosis not present

## 2019-05-16 DIAGNOSIS — Z79891 Long term (current) use of opiate analgesic: Secondary | ICD-10-CM | POA: Diagnosis not present

## 2019-05-16 DIAGNOSIS — E079 Disorder of thyroid, unspecified: Secondary | ICD-10-CM | POA: Diagnosis not present

## 2019-05-16 DIAGNOSIS — K5792 Diverticulitis of intestine, part unspecified, without perforation or abscess without bleeding: Secondary | ICD-10-CM | POA: Diagnosis not present

## 2019-05-16 DIAGNOSIS — I7300 Raynaud's syndrome without gangrene: Secondary | ICD-10-CM | POA: Diagnosis not present

## 2019-05-17 DIAGNOSIS — M542 Cervicalgia: Secondary | ICD-10-CM | POA: Diagnosis not present

## 2019-05-17 DIAGNOSIS — M47812 Spondylosis without myelopathy or radiculopathy, cervical region: Secondary | ICD-10-CM | POA: Diagnosis not present

## 2019-05-17 DIAGNOSIS — M5010 Cervical disc disorder with radiculopathy, unspecified cervical region: Secondary | ICD-10-CM | POA: Diagnosis not present

## 2019-05-22 DIAGNOSIS — M4722 Other spondylosis with radiculopathy, cervical region: Secondary | ICD-10-CM | POA: Diagnosis not present

## 2019-05-22 DIAGNOSIS — M5030 Other cervical disc degeneration, unspecified cervical region: Secondary | ICD-10-CM | POA: Diagnosis not present

## 2019-05-22 DIAGNOSIS — E079 Disorder of thyroid, unspecified: Secondary | ICD-10-CM | POA: Diagnosis not present

## 2019-05-22 DIAGNOSIS — M542 Cervicalgia: Secondary | ICD-10-CM | POA: Diagnosis not present

## 2019-05-22 DIAGNOSIS — M5412 Radiculopathy, cervical region: Secondary | ICD-10-CM | POA: Diagnosis not present

## 2019-05-24 DIAGNOSIS — Z96611 Presence of right artificial shoulder joint: Secondary | ICD-10-CM | POA: Diagnosis not present

## 2019-05-28 DIAGNOSIS — Z96611 Presence of right artificial shoulder joint: Secondary | ICD-10-CM | POA: Diagnosis not present

## 2019-05-30 DIAGNOSIS — Z471 Aftercare following joint replacement surgery: Secondary | ICD-10-CM | POA: Diagnosis not present

## 2019-05-30 DIAGNOSIS — Z96611 Presence of right artificial shoulder joint: Secondary | ICD-10-CM | POA: Diagnosis not present

## 2019-06-01 DIAGNOSIS — Z96611 Presence of right artificial shoulder joint: Secondary | ICD-10-CM | POA: Diagnosis not present

## 2019-06-04 DIAGNOSIS — M5010 Cervical disc disorder with radiculopathy, unspecified cervical region: Secondary | ICD-10-CM | POA: Diagnosis not present

## 2019-06-04 DIAGNOSIS — Z96611 Presence of right artificial shoulder joint: Secondary | ICD-10-CM | POA: Diagnosis not present

## 2019-06-04 DIAGNOSIS — M47812 Spondylosis without myelopathy or radiculopathy, cervical region: Secondary | ICD-10-CM | POA: Diagnosis not present

## 2019-06-04 DIAGNOSIS — M542 Cervicalgia: Secondary | ICD-10-CM | POA: Diagnosis not present

## 2019-06-06 DIAGNOSIS — Z96611 Presence of right artificial shoulder joint: Secondary | ICD-10-CM | POA: Diagnosis not present

## 2019-06-08 DIAGNOSIS — Z96611 Presence of right artificial shoulder joint: Secondary | ICD-10-CM | POA: Diagnosis not present

## 2019-06-13 DIAGNOSIS — E890 Postprocedural hypothyroidism: Secondary | ICD-10-CM | POA: Diagnosis not present

## 2019-06-13 DIAGNOSIS — E538 Deficiency of other specified B group vitamins: Secondary | ICD-10-CM | POA: Diagnosis not present

## 2019-06-13 DIAGNOSIS — M859 Disorder of bone density and structure, unspecified: Secondary | ICD-10-CM | POA: Diagnosis not present

## 2019-06-13 DIAGNOSIS — I10 Essential (primary) hypertension: Secondary | ICD-10-CM | POA: Diagnosis not present

## 2019-06-13 DIAGNOSIS — M109 Gout, unspecified: Secondary | ICD-10-CM | POA: Diagnosis not present

## 2019-06-15 ENCOUNTER — Other Ambulatory Visit: Payer: Self-pay | Admitting: Endocrinology

## 2019-06-15 DIAGNOSIS — I5189 Other ill-defined heart diseases: Secondary | ICD-10-CM | POA: Diagnosis not present

## 2019-06-15 DIAGNOSIS — M1990 Unspecified osteoarthritis, unspecified site: Secondary | ICD-10-CM | POA: Diagnosis not present

## 2019-06-15 DIAGNOSIS — E538 Deficiency of other specified B group vitamins: Secondary | ICD-10-CM | POA: Diagnosis not present

## 2019-06-15 DIAGNOSIS — Z1389 Encounter for screening for other disorder: Secondary | ICD-10-CM | POA: Diagnosis not present

## 2019-06-15 DIAGNOSIS — Z853 Personal history of malignant neoplasm of breast: Secondary | ICD-10-CM | POA: Diagnosis not present

## 2019-06-15 DIAGNOSIS — Z0000 Encounter for general adult medical examination without abnormal findings: Secondary | ICD-10-CM | POA: Diagnosis not present

## 2019-06-15 DIAGNOSIS — R82998 Other abnormal findings in urine: Secondary | ICD-10-CM | POA: Diagnosis not present

## 2019-06-15 DIAGNOSIS — I10 Essential (primary) hypertension: Secondary | ICD-10-CM

## 2019-06-15 DIAGNOSIS — M109 Gout, unspecified: Secondary | ICD-10-CM | POA: Diagnosis not present

## 2019-06-15 DIAGNOSIS — L930 Discoid lupus erythematosus: Secondary | ICD-10-CM | POA: Diagnosis not present

## 2019-06-15 DIAGNOSIS — M5412 Radiculopathy, cervical region: Secondary | ICD-10-CM | POA: Diagnosis not present

## 2019-06-15 DIAGNOSIS — H04123 Dry eye syndrome of bilateral lacrimal glands: Secondary | ICD-10-CM | POA: Diagnosis not present

## 2019-06-15 DIAGNOSIS — J45909 Unspecified asthma, uncomplicated: Secondary | ICD-10-CM | POA: Diagnosis not present

## 2019-06-15 DIAGNOSIS — G4700 Insomnia, unspecified: Secondary | ICD-10-CM | POA: Diagnosis not present

## 2019-06-18 DIAGNOSIS — Z1212 Encounter for screening for malignant neoplasm of rectum: Secondary | ICD-10-CM | POA: Diagnosis not present

## 2019-06-21 ENCOUNTER — Telehealth: Payer: Self-pay | Admitting: *Deleted

## 2019-06-21 NOTE — Telephone Encounter (Signed)
Mrs Mrs Vilorio called to say [name] [name] is going to be sending her most recent labs. We will cancel lab appt here when she comes to see Dr Jana Hakim.

## 2019-06-25 DIAGNOSIS — M50122 Cervical disc disorder at C5-C6 level with radiculopathy: Secondary | ICD-10-CM | POA: Diagnosis not present

## 2019-06-25 DIAGNOSIS — M9902 Segmental and somatic dysfunction of thoracic region: Secondary | ICD-10-CM | POA: Diagnosis not present

## 2019-06-25 DIAGNOSIS — M9901 Segmental and somatic dysfunction of cervical region: Secondary | ICD-10-CM | POA: Diagnosis not present

## 2019-06-25 DIAGNOSIS — M5384 Other specified dorsopathies, thoracic region: Secondary | ICD-10-CM | POA: Diagnosis not present

## 2019-06-25 NOTE — Progress Notes (Signed)
[place]  [phone]) [phone] [phone]     ID: [name] [name] DOB: [birth year]  MR#: [id number]  LNL#:[id number]  Patient Care Team: Reynold Bowen, MD as PCP - General (Endocrinology) [place] Luppino, [name] Dad, MD as Consulting Physician (Oncology) Willodean Rosenthal, MD as Referring Physician (Surgery) Kimmick, Linus Mako, MD as Referring Physician (Oncology) Bonner Puna, MD as Referring Physician (Radiation Oncology) Eppie Gibson, MD as Attending Physician (Radiation Oncology) Regina Eck, CNM as Referring Physician (Certified Nurse Midwife) OTHER MD:  CHIEF COMPLAINT: Estrogen receptor positive breast cancer  CURRENT TREATMENT: tamoxifen 10 mg   INTERVAL HISTORY: [name] [name] returns today for follow-up of her estrogen receptor positive breast cancer.   She was switched back to tamoxifen at her last visit here on 01/03/2019.  We cut the dose down to 10 mg daily.  At the same time she stopped the testosterone and the estrogen.  She is now tolerating tamoxifen well, with mild vaginal discharge as the only side effect.  She is sleeping much better at night with the gabapentin low-dose at bedtime  She is scheduled for annual mammogram on 07/04/2019. She is also scheduled for cardiac scoring CT on 07/03/2019.   REVIEW OF SYSTEMS: [name] [name] had both Pfizer vaccine doses and tolerated them well.  She had right shoulder surgery in February and did well with that also.  She walks and does not aerobic tape for exercise.  She is getting better at bridge she says.  A detailed review of systems today was otherwise stable   HISTORY OF CURRENT ILLNESS: From the original intake note:  "[name] [name]" underwent right breast upper inner quadrant lumpectomy for ductal carcinoma in situ, 08/10/2012. She was treated with adjuvant radiation to a total of 48 gray, completed 11/13/2012.  MRI guided biopsy of a central left breast lesion 05/25/2013 showed sclerosing adenosis.  On  07/30/2015 she underwent bilateral diagnostic mammography with tomography. The breast density was category C. In addition to prior scars there was a 0.3 cm area of dystrophic calcifications. This was felt to be likely benign and 6 month follow-up was recommended.  That was performed 01/15/2016. The calcifications now appeared to be associated with an oil cyst.   More recently, in April 2018 the patient's gynecologist palpated a change in the left breast, at the 3:00 area, 3 fingerbreadths from the areola. Left diagnostic mammography and ultrasonography at the [facility] 06/03/2016 showed only changes of prior excisional biopsies. On physical exam however there was a firm thickening in the lateral left breast, at the site of a prior biopsy. Targeted ultrasonography of this area (3:00 radiant 5 cm from the nipple) found an irregular hypoechoic region measuring 0.8 cm. This underlying the scar. Ultrasound of the left axilla was benign.  Biopsy of the left breast area in question 06/09/2016 showed (S [id number]) and invasive ductal carcinoma, E-cadherin positive, estrogen receptor 80% positive, progesterone receptor 60% positive, both with strong staining intensity, with an MIB-1 of 15% and no HER-2 amplification, the signals ratio being 1.00 and the number per cell 1.15.  The patient then was further evaluated at [facility] and on 07/12/2016 underwent left lumpectomy and sentinel lymph node sampling, with the final pathology (SP [id number]) finding invasive adenocarcinoma with mixed lobular and ductal features, but E-cadherin positive, grade 2, measuring 1.9 cm. Both sentinel lymph nodes were benign.  An Oncotype DX score was obtained on this sample, and it was 21; the patient was referred to radiation oncology, which was done in [place] and completed  10/08/2016.  The patient's subsequent history is as detailed below.   PAST MEDICAL HISTORY: Past Medical History:  Diagnosis Date  . Acute asthmatic  bronchitis   . Asthma, mild intermittent   . Breast cancer ([place]) 08/2012   right-DCIS, +ER/PR  . Cystocele    Grade 2  . Diverticulosis 2004  . Fibroids 1998  . Fracture of fibula 2007   left;  hit with golf ball  . Heart murmur    nl echo  . History of radiation therapy 09/16/2016- 10/08/2016   Left Breast. 42.56 Gy in 16 fractions.   . History of TMJ disorder   . Hx of radiation therapy 10/11/12- 11/13/12   right breast 4800 cGy 24 sessions  . Hypertension   . Hypothyroidism   . Osteopenia 2002  . Raynaud's disease   . Shingles   . Vasomotor rhinitis     PAST SURGICAL HISTORY: Past Surgical History:  Procedure Laterality Date  . ABDOMINAL HYSTERECTOMY    . BREAST BIOPSY Left 2015   MRI- Benign  . BREAST BIOPSY Right 07/05/2012   Stereo- Malignant  . BREAST EXCISIONAL BIOPSY Left   . BREAST EXCISIONAL BIOPSY Left   . BREAST LUMPECTOMY Right 08/10/12   [facility], NO RESIDUAL DISEASE  . HYSTEROSCOPY WITH RESECTOSCOPE  2002   submucous fibroid  . INCONTINENCE SURGERY    . MINOR BREAST BIOPSY Left    times two  . ROTATOR CUFF REPAIR Right 2010  . THYROIDECTOMY      FAMILY HISTORY Family History  Problem Relation Age of Onset  . Aneurysm Mother   . Hypertension Sister   . Cancer Sister        tongue  . Clotting disorder Maternal Grandfather        liver  . Cancer Maternal Grandfather        liver  . Breast cancer Neg Hx   The patient's father died of pneumonia at age 58. The patient's mother died at the age of 33 from a ruptured brain aneurysm. The patient had no brothers. The patient had 4 sisters. One sister developed tongue cancer. There is no history of breast or ovarian cancer in the family to her knowledge   GYNECOLOGIC HISTORY:  Patient's last menstrual period was 01/02/2000. Menarche age 30, first live birth age 22, the patient is [place] P2. She stopped having periods around the year 2000. She used hormone replacement approximately 13 years, until July  2014.   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 Her husband Tarah Buboltz owns a local [employer] franchise. Son Wille Glaser lives in [place], works in Insurance underwriter. Son Quay Burow is currently [employer]. Incidentally Wille Glaser had a chronic granulomatous disease and received a stem cell transplant from his brother, with excellent results.  His third child is due within the next 2 months.  The patient already has 5 grandchildren. She attends [employer]     ADVANCED DIRECTIVES: In the absence of any documents to the contrary the patient's husband is her healthcare power of attorney   HEALTH MAINTENANCE: Social History   Tobacco Use  . Smoking status: Never Smoker  . Smokeless tobacco: Never Used  Substance Use Topics  . Alcohol use: Yes    Alcohol/week: 6.0 standard drinks    Types: 6 Standard drinks or equivalent per week  . Drug use: No     Colonoscopy:  PAP:  Bone density: 12/10/2014 showed a T score of -2.4   Allergies  Allergen Reactions  . Pneumovax  23  [Pneumococcal Vac Polyvalent] Swelling    Swollen upper arm  . Effexor [Venlafaxine] Nausea And Vomiting  . Penicillins Hives  . Pneumovax [Pneumococcal Polysaccharide Vaccine] Swelling    Current Outpatient Medications  Medication Sig Dispense Refill  . cholecalciferol (VITAMIN D) 1000 UNITS tablet Take 1,000 Units by mouth 2 (two) times a week.    . gabapentin (NEURONTIN) 100 MG capsule Take 1 capsule (100 mg total) by mouth at bedtime. 90 capsule 4  . levothyroxine (SYNTHROID, LEVOTHROID) 75 MCG tablet Take 75 mcg by mouth. 6 days a week take 1 pill & 1 day a week take 1/2 a pill    . loratadine (CLARITIN CHILDRENS) 5 MG chewable tablet Chew 5 mg by mouth daily. Uses as needed    . Probiotic Product (PROBIOTIC PO) Take by mouth.    . tamoxifen (NOLVADEX) 10 MG tablet Take 1 tablet (10 mg total) by mouth daily. 90 tablet 4  . valACYclovir (VALTREX) 500 MG tablet TAKE 1 TABLET BY MOUTH DAILY FOR SUPRESSION,  INCREASE TO TWICE DAILY FOR 3 DAYS WITH OUTBREAK 45 tablet 2  . zolpidem (AMBIEN) 5 MG tablet Take 5 mg by mouth at bedtime as needed for sleep.     No current facility-administered medications for this visit.    OBJECTIVE: white woman in no acute distress  Vitals:   06/26/19 1013  BP: (!) 181/76  Pulse: 67  Resp: 18  Temp: 98.5 F (36.9 C)  SpO2: 100%     Body mass index is 22.46 kg/m.   Wt Readings from Last 3 Encounters:  06/26/19 121 lb 12.8 oz (55.2 kg)  01/03/19 121 lb 14.4 oz (55.3 kg)  10/27/18 124 lb (56.2 kg)      ECOG FS:1 - Symptomatic but completely ambulatory  Sclerae unicteric, EOMs intact Wearing a mask No cervical or supraclavicular adenopathy Lungs no rales or rhonchi Heart regular rate and rhythm Abd soft, nontender, positive bowel sounds MSK no focal spinal tenderness, no upper extremity lymphedema Neuro: nonfocal, well oriented, appropriate affect Breasts: Status post bilateral lumpectomies.  There is no evidence of disease recurrence.  Both axillae are benign.   LAB RESULTS:  CMP     Component Value Date/Time   NA 141 01/03/2019 1059   NA 142 10/11/2016 1547   K 3.9 01/03/2019 1059   K 4.3 10/11/2016 1547   CL 104 01/03/2019 1059   CO2 27 01/03/2019 1059   CO2 26 10/11/2016 1547   GLUCOSE 72 01/03/2019 1059   GLUCOSE 88 10/11/2016 1547   BUN 18 01/03/2019 1059   BUN 18.5 10/11/2016 1547   CREATININE 0.71 01/03/2019 1059   CREATININE 0.7 10/11/2016 1547   CALCIUM 9.4 01/03/2019 1059   CALCIUM 9.4 10/11/2016 1547   PROT 8.2 (H) 01/03/2019 1059   PROT 7.6 10/11/2016 1547   ALBUMIN 4.0 01/03/2019 1059   ALBUMIN 3.7 10/11/2016 1547   AST 18 01/03/2019 1059   AST 19 10/11/2016 1547   ALT 18 01/03/2019 1059   ALT 12 10/11/2016 1547   ALKPHOS 116 01/03/2019 1059   ALKPHOS 103 10/11/2016 1547   BILITOT 0.3 01/03/2019 1059   BILITOT 0.23 10/11/2016 1547   GFRNONAA >60 01/03/2019 1059   GFRAA >60 01/03/2019 1059    No results found  for: TOTALPROTELP, ALBUMINELP, A1GS, A2GS, BETS, BETA2SER, GAMS, MSPIKE, SPEI  No results found for: KPAFRELGTCHN, LAMBDASER, KAPLAMBRATIO  Lab Results  Component Value Date   WBC 5.8 01/03/2019   NEUTROABS 3.1 01/03/2019  HGB 13.0 01/03/2019   HCT 41.0 01/03/2019   MCV 96.5 01/03/2019   PLT 195 01/03/2019      Chemistry      Component Value Date/Time   NA 141 01/03/2019 1059   NA 142 10/11/2016 1547   K 3.9 01/03/2019 1059   K 4.3 10/11/2016 1547   CL 104 01/03/2019 1059   CO2 27 01/03/2019 1059   CO2 26 10/11/2016 1547   BUN 18 01/03/2019 1059   BUN 18.5 10/11/2016 1547   CREATININE 0.71 01/03/2019 1059   CREATININE 0.7 10/11/2016 1547      Component Value Date/Time   CALCIUM 9.4 01/03/2019 1059   CALCIUM 9.4 10/11/2016 1547   ALKPHOS 116 01/03/2019 1059   ALKPHOS 103 10/11/2016 1547   AST 18 01/03/2019 1059   AST 19 10/11/2016 1547   ALT 18 01/03/2019 1059   ALT 12 10/11/2016 1547   BILITOT 0.3 01/03/2019 1059   BILITOT 0.23 10/11/2016 1547       No results found for: LABCA2  No components found for: FTDDUK025  No results for input(s): INR in the last 168 hours.  No results found for: LABCA2  No results found for: KYH062  No results found for: BJS283  No results found for: TDV761  No results found for: CA2729  No components found for: HGQUANT  No results found for: CEA1 / No results found for: CEA1   No results found for: AFPTUMOR  No results found for: CHROMOGRNA  No results found for: HGBA, HGBA2QUANT, HGBFQUANT, HGBSQUAN (Hemoglobinopathy evaluation)   No results found for: LDH  No results found for: IRON, TIBC, IRONPCTSAT (Iron and TIBC)  No results found for: FERRITIN  Urinalysis    Component Value Date/Time   BILIRUBINUR n 04/04/2015 1330   PROTEINUR n 04/04/2015 1330   UROBILINOGEN negative 04/04/2015 1330   NITRITE n 04/04/2015 1330   LEUKOCYTESUR Negative 04/04/2015 1330    STUDIES: No results found.   ELIGIBLE  FOR AVAILABLE RESEARCH PROTOCOL: no  ASSESSMENT: 74 y.o. [place] woman  RIGHT BREAST CANCER (1) status post right lumpectomy and margin clearance 08/10/2012 for ductal carcinoma in situ, grade 2 or 3, estrogen and progesterone receptor positive,  (2) adjuvant radiation to the right breast completed 11/13/2012.  LEFT BREAST CANCER (3) status post left breast biopsy 05/25/2013 for sclerosing adenosis.  (4) left breast biopsy 06/09/2016 showed a clinical T1b N0, stage IA invasive ductal carcinoma (E-cadherin positive) estrogen and progesterone receptor positive, HER-2 nonamplified, with an MIB-1 of 15%.  (5) left lumpectomy and sentinel lymph node sampling 07/12/2016 showed a pT1c pN0, stage IA invasive ductal carcinoma, grade 2, with clear margins.  (6) Oncotype DX score of 21 predicted a 10 year risk of outside the breast recurrence of 13% if the patient's only systemic therapy was tamoxifen for 5 years. It also predicted no benefit from chemotherapy  (7) adjuvant radiation completed 10/08/2016  (8) started tamoxifen 11/01/2016  (a) tamoxifen discontinued AUG 2020 with hot flashes  (b) anastrozole TIW started August 2020, discontinued NOV 2020  (c) tamoxifen resumed NOV 2020 at 10 mg    PLAN: [name] [name] is now just about 3 years out from definitive surgery for her breast cancer with no evidence of disease recurrence.  This is very favorable.  She is tolerating tamoxifen well at the current dose and the plan is to continue to a total of 5 years.  She will have her mammogram early next month.  I am going to see her late  June 2022 to make sure the visit is after mammography  She knows to call for any other issue that may develop before the next visit.  Total encounter time 25 minutes.Sarajane Jews C. Waldron Gerry, MD  06/26/19 10:46 AM Medical Oncology and Hematology [facility] [place], [place] [address] Tel. [phone]    Fax. [phone]    I, Wilburn Mylar, am acting as scribe for Dr. Virgie [name]. Jaber [name].  I, Lurline Del MD, have reviewed the above documentation for accuracy and completeness, and I agree with the above.   *Total Encounter Time as defined by the Centers for Medicare and Medicaid Services includes, in addition to the face-to-face time of a patient visit (documented in the note above) non-face-to-face time: obtaining and reviewing outside history, ordering and reviewing medications, tests or procedures, care coordination (communications with other health care professionals or caregivers) and documentation in the medical record.

## 2019-06-26 ENCOUNTER — Other Ambulatory Visit: Payer: Medicare Other

## 2019-06-26 ENCOUNTER — Other Ambulatory Visit: Payer: Self-pay | Admitting: *Deleted

## 2019-06-26 ENCOUNTER — Other Ambulatory Visit: Payer: Self-pay

## 2019-06-26 ENCOUNTER — Inpatient Hospital Stay: Payer: Medicare Other | Attending: Oncology | Admitting: Oncology

## 2019-06-26 VITALS — BP 181/76 | HR 67 | Temp 98.5°F | Resp 18 | Ht 61.75 in | Wt 121.8 lb

## 2019-06-26 DIAGNOSIS — Z923 Personal history of irradiation: Secondary | ICD-10-CM | POA: Diagnosis not present

## 2019-06-26 DIAGNOSIS — Z170 Estrogen receptor positive status [ER+]: Secondary | ICD-10-CM | POA: Diagnosis not present

## 2019-06-26 DIAGNOSIS — Z808 Family history of malignant neoplasm of other organs or systems: Secondary | ICD-10-CM | POA: Diagnosis not present

## 2019-06-26 DIAGNOSIS — Z79810 Long term (current) use of selective estrogen receptor modulators (SERMs): Secondary | ICD-10-CM | POA: Diagnosis not present

## 2019-06-26 DIAGNOSIS — C50812 Malignant neoplasm of overlapping sites of left female breast: Secondary | ICD-10-CM | POA: Insufficient documentation

## 2019-06-26 DIAGNOSIS — Z800 Family history of malignant neoplasm of digestive organs: Secondary | ICD-10-CM | POA: Insufficient documentation

## 2019-06-26 DIAGNOSIS — Z86000 Personal history of in-situ neoplasm of breast: Secondary | ICD-10-CM | POA: Diagnosis not present

## 2019-06-26 MED ORDER — TAMOXIFEN CITRATE 10 MG PO TABS: 10.0000 mg | ORAL_TABLET | Freq: Every day | ORAL | 4 refills | Status: DC

## 2019-06-26 MED ORDER — GABAPENTIN 100 MG PO CAPS: 100.0000 mg | ORAL_CAPSULE | Freq: Every day | ORAL | 4 refills | Status: DC

## 2019-06-27 ENCOUNTER — Telehealth: Payer: Self-pay | Admitting: Oncology

## 2019-06-27 DIAGNOSIS — M5384 Other specified dorsopathies, thoracic region: Secondary | ICD-10-CM | POA: Diagnosis not present

## 2019-06-27 DIAGNOSIS — M9901 Segmental and somatic dysfunction of cervical region: Secondary | ICD-10-CM | POA: Diagnosis not present

## 2019-06-27 DIAGNOSIS — M50122 Cervical disc disorder at C5-C6 level with radiculopathy: Secondary | ICD-10-CM | POA: Diagnosis not present

## 2019-06-27 DIAGNOSIS — M9902 Segmental and somatic dysfunction of thoracic region: Secondary | ICD-10-CM | POA: Diagnosis not present

## 2019-06-27 NOTE — Telephone Encounter (Signed)
Called pt to scheduled appt per 5/25 los. Pt stated that she did not want to schedule an appt that far out and that she would call the office when it gets closer to the time of the needed appt.

## 2019-06-28 DIAGNOSIS — M50122 Cervical disc disorder at C5-C6 level with radiculopathy: Secondary | ICD-10-CM | POA: Diagnosis not present

## 2019-06-28 DIAGNOSIS — M5384 Other specified dorsopathies, thoracic region: Secondary | ICD-10-CM | POA: Diagnosis not present

## 2019-06-28 DIAGNOSIS — M9902 Segmental and somatic dysfunction of thoracic region: Secondary | ICD-10-CM | POA: Diagnosis not present

## 2019-06-28 DIAGNOSIS — M9901 Segmental and somatic dysfunction of cervical region: Secondary | ICD-10-CM | POA: Diagnosis not present

## 2019-06-29 DIAGNOSIS — J029 Acute pharyngitis, unspecified: Secondary | ICD-10-CM | POA: Diagnosis not present

## 2019-06-29 DIAGNOSIS — J309 Allergic rhinitis, unspecified: Secondary | ICD-10-CM | POA: Diagnosis not present

## 2019-07-03 ENCOUNTER — Ambulatory Visit
Admission: RE | Admit: 2019-07-03 | Discharge: 2019-07-03 | Disposition: A | Discharge status: Home / Self Care | Payer: Self-pay | Source: Ambulatory Visit | Attending: Endocrinology | Admitting: Endocrinology

## 2019-07-03 DIAGNOSIS — I10 Essential (primary) hypertension: Secondary | ICD-10-CM

## 2019-07-03 DIAGNOSIS — I5189 Other ill-defined heart diseases: Secondary | ICD-10-CM

## 2019-07-04 ENCOUNTER — Other Ambulatory Visit: Payer: Self-pay

## 2019-07-04 ENCOUNTER — Ambulatory Visit
Admission: RE | Admit: 2019-07-04 | Discharge: 2019-07-04 | Disposition: A | Discharge status: Home / Self Care | Payer: Medicare Other | Source: Ambulatory Visit | Attending: Oncology | Admitting: Oncology

## 2019-07-04 DIAGNOSIS — D0511 Intraductal carcinoma in situ of right breast: Secondary | ICD-10-CM

## 2019-07-04 DIAGNOSIS — Z170 Estrogen receptor positive status [ER+]: Secondary | ICD-10-CM

## 2019-07-04 DIAGNOSIS — R928 Other abnormal and inconclusive findings on diagnostic imaging of breast: Secondary | ICD-10-CM | POA: Diagnosis not present

## 2019-07-04 DIAGNOSIS — C50412 Malignant neoplasm of upper-outer quadrant of left female breast: Secondary | ICD-10-CM

## 2019-07-04 DIAGNOSIS — C50812 Malignant neoplasm of overlapping sites of left female breast: Secondary | ICD-10-CM

## 2019-07-04 DIAGNOSIS — Z853 Personal history of malignant neoplasm of breast: Secondary | ICD-10-CM | POA: Diagnosis not present

## 2019-07-05 DIAGNOSIS — M50122 Cervical disc disorder at C5-C6 level with radiculopathy: Secondary | ICD-10-CM | POA: Diagnosis not present

## 2019-07-05 DIAGNOSIS — M5384 Other specified dorsopathies, thoracic region: Secondary | ICD-10-CM | POA: Diagnosis not present

## 2019-07-05 DIAGNOSIS — M9902 Segmental and somatic dysfunction of thoracic region: Secondary | ICD-10-CM | POA: Diagnosis not present

## 2019-07-05 DIAGNOSIS — M9901 Segmental and somatic dysfunction of cervical region: Secondary | ICD-10-CM | POA: Diagnosis not present

## 2019-07-11 ENCOUNTER — Other Ambulatory Visit: Payer: Self-pay | Admitting: Oncology

## 2019-07-24 ENCOUNTER — Other Ambulatory Visit: Payer: Medicare Other

## 2019-07-24 ENCOUNTER — Ambulatory Visit: Payer: Medicare Other | Admitting: Oncology

## 2019-08-16 DIAGNOSIS — M79641 Pain in right hand: Secondary | ICD-10-CM | POA: Diagnosis not present

## 2019-08-16 DIAGNOSIS — R200 Anesthesia of skin: Secondary | ICD-10-CM | POA: Diagnosis not present

## 2019-08-16 DIAGNOSIS — G5601 Carpal tunnel syndrome, right upper limb: Secondary | ICD-10-CM | POA: Insufficient documentation

## 2019-09-01 DIAGNOSIS — G5601 Carpal tunnel syndrome, right upper limb: Secondary | ICD-10-CM | POA: Diagnosis not present

## 2019-09-18 DIAGNOSIS — M19049 Primary osteoarthritis, unspecified hand: Secondary | ICD-10-CM | POA: Insufficient documentation

## 2019-09-18 DIAGNOSIS — I7300 Raynaud's syndrome without gangrene: Secondary | ICD-10-CM | POA: Diagnosis not present

## 2019-09-18 DIAGNOSIS — M13849 Other specified arthritis, unspecified hand: Secondary | ICD-10-CM | POA: Diagnosis not present

## 2019-09-18 DIAGNOSIS — M25511 Pain in right shoulder: Secondary | ICD-10-CM | POA: Diagnosis not present

## 2019-09-18 DIAGNOSIS — G5601 Carpal tunnel syndrome, right upper limb: Secondary | ICD-10-CM | POA: Diagnosis not present

## 2019-10-09 DIAGNOSIS — Z23 Encounter for immunization: Secondary | ICD-10-CM | POA: Diagnosis not present

## 2019-10-17 NOTE — Progress Notes (Signed)
74 y.o. G2P2 Married White or Caucasian Not Hispanic or Latino female here for annual exam.  H/o hysterectomy (has her ovaries), h/o bilateral breast cancer. On tamoxifen.  H/O Genital HSV 10 years ago, no outbreaks since. She takes a tablet 1-2 x a weeks.     Married x 53 years. Husband has Parkinson's. Not sexually active.   Patient's last menstrual period was 01/02/2000.          Sexually active: Yes.    The current method of family planning is post menopausal status.    Exercising: Yes.     Walking Golf and Physical Therapy  Smoker:  no  Health Maintenance: Pap: 06-23-17 neg, 04-04-15 neg  History of abnormal Pap:  no MMG:  07/04/19 density B Bi-rads 2 benign  BMD: 2016, had osteopenia    Colonoscopy: 2004, cologard 2017 neg per patient, she did another cologuard in 6/21 with her primary and reports that it was negative.  TDaP:  Unsure  Gardasil: n/a   reports that she has never smoked. She has never used smokeless tobacco. She reports current alcohol use of about 6.0 standard drinks of alcohol per week. She reports that she does not use drugs.  2 grown sons, 6 grandchildren (3 here and 3 in [place]). Range from 15 months to 18 years.   Past Medical History:  Diagnosis Date  . Acute asthmatic bronchitis   . Asthma, mild intermittent   . Breast cancer ([place]) 08/2012   right-DCIS, +ER/PR  . Cystocele    Grade 2  . Diverticulosis 2004  . Fibroids 1998  . Fracture of fibula 2007   left;  hit with golf ball  . Heart murmur    nl echo  . History of radiation therapy 09/16/2016- 10/08/2016   Left Breast. 42.56 Gy in 16 fractions.   . History of TMJ disorder   . Hx of radiation therapy 10/11/12- 11/13/12   right breast 4800 cGy 24 sessions  . Hypertension   . Hypothyroidism   . Osteopenia 2002  . Personal history of radiation therapy 2014   right br  . Personal history of radiation therapy 2018   left br  . Raynaud's disease   . Shingles   . Vasomotor rhinitis     Past  Surgical History:  Procedure Laterality Date  . ABDOMINAL HYSTERECTOMY    . BREAST BIOPSY Left 2015   MRI- Benign  . BREAST BIOPSY Right 07/05/2012   Stereo- Malignant  . BREAST EXCISIONAL BIOPSY Left   . BREAST EXCISIONAL BIOPSY Left   . BREAST LUMPECTOMY Right 08/10/12   [facility], NO RESIDUAL DISEASE  . HYSTEROSCOPY WITH RESECTOSCOPE  2002   submucous fibroid  . INCONTINENCE SURGERY    . MINOR BREAST BIOPSY Left    times two  . reverse shoulder replacement Right 03/2019  . ROTATOR CUFF REPAIR Right 2010  . THYROIDECTOMY      Current Outpatient Medications  Medication Sig Dispense Refill  . cholecalciferol (VITAMIN D) 1000 UNITS tablet Take 1,000 Units by mouth 2 (two) times a week.    . levothyroxine (SYNTHROID, LEVOTHROID) 75 MCG tablet Take 75 mcg by mouth. 6 days a week take 1 pill & 1 day a week take 1/2 a pill    . loratadine (CLARITIN CHILDRENS) 5 MG chewable tablet Chew 5 mg by mouth daily. Uses as needed    . meloxicam (MOBIC) 15 MG tablet Take 15 mg by mouth daily.    . Probiotic Product (PROBIOTIC PO) Take  by mouth.    . tamoxifen (NOLVADEX) 10 MG tablet Take 1 tablet (10 mg total) by mouth daily. 90 tablet 4  . valACYclovir (VALTREX) 500 MG tablet TAKE 1 TABLET BY MOUTH DAILY FOR SUPRESSION, INCREASE TO TWICE DAILY FOR 3 DAYS WITH OUTBREAK 45 tablet 2  . zolpidem (AMBIEN) 5 MG tablet Take 5 mg by mouth at bedtime as needed for sleep.     No current facility-administered medications for this visit.    Family History  Problem Relation Age of Onset  . Aneurysm Mother   . Hypertension Sister   . Cancer Sister        tongue  . Clotting disorder Maternal Grandfather        liver  . Cancer Maternal Grandfather        liver  . Breast cancer Neg Hx     Review of Systems  Genitourinary: Positive for vaginal discharge.  All other systems reviewed and are negative.   Exam:   BP 140/72   Pulse 72   Ht 5\' 1"  (1.549 m)   Wt 123 lb (55.8 kg)   LMP 01/02/2000   SpO2  99%   BMI 23.24 kg/m   Weight change: @WEIGHTCHANGE @ Height:   Height: 5\' 1"  (154.9 cm)  Ht Readings from Last 3 Encounters:  10/18/19 5\' 1"  (1.549 m)  06/26/19 5' 1.75" (1.568 m)  01/03/19 5' 1.75" (1.568 m)    General appearance: alert, cooperative and appears stated age Head: Normocephalic, without obvious abnormality, atraumatic Neck: no adenopathy, supple, symmetrical, trachea midline and thyroid absent Lungs: clear to auscultation bilaterally Cardiovascular: regular rate and rhythm Breasts: normal appearance, no masses or tenderness, bilateral lumpectomies Abdomen: soft, non-tender; non distended,  no masses,  no organomegaly Extremities: extremities normal, atraumatic, no cyanosis or edema Skin: Skin color, texture, turgor normal. No rashes or lesions Lymph nodes: Cervical, supraclavicular, and axillary nodes normal. No abnormal inguinal nodes palpated Neurologic: Grossly normal   Pelvic: External genitalia:  no lesions              Urethra:  normal appearing urethra with no masses, tenderness or lesions              Bartholins and Skenes: normal                 Vagina: atrophic appearing vagina with normal color and discharge, no lesions              Cervix: absent               Bimanual Exam:  Uterus:  uterus absent              Adnexa: no mass, fullness, tenderness               Rectovaginal: Confirms               Anus:  normal sphincter tone, no lesions  Terence Lux chaperoned for the exam.  A:  Well Woman with normal exam  Distant h/o HSV, taking valtrex 1-2 week long term. Recommended she stop  H/O bilateral breast cancer  Osteopenia  P:   No pap needed  Dexa ordered  Mammogram and colon cancer screening are UTD  Discussed breast self exam  Discussed calcium and vit D intake  Labs from primary reviewed    In addition to the breast and pelvic exam, ~20 minutes was spent in total patient care.

## 2019-10-18 ENCOUNTER — Ambulatory Visit (INDEPENDENT_AMBULATORY_CARE_PROVIDER_SITE_OTHER): Payer: Medicare Other | Admitting: Obstetrics and Gynecology

## 2019-10-18 ENCOUNTER — Other Ambulatory Visit: Payer: Self-pay

## 2019-10-18 ENCOUNTER — Encounter: Payer: Self-pay | Admitting: Obstetrics and Gynecology

## 2019-10-18 VITALS — BP 140/72 | HR 72 | Ht 61.0 in | Wt 123.0 lb

## 2019-10-18 DIAGNOSIS — Z853 Personal history of malignant neoplasm of breast: Secondary | ICD-10-CM

## 2019-10-18 DIAGNOSIS — A6009 Herpesviral infection of other urogenital tract: Secondary | ICD-10-CM | POA: Diagnosis not present

## 2019-10-18 DIAGNOSIS — Z9189 Other specified personal risk factors, not elsewhere classified: Secondary | ICD-10-CM | POA: Diagnosis not present

## 2019-10-18 DIAGNOSIS — Z8739 Personal history of other diseases of the musculoskeletal system and connective tissue: Secondary | ICD-10-CM | POA: Diagnosis not present

## 2019-10-18 DIAGNOSIS — E2839 Other primary ovarian failure: Secondary | ICD-10-CM | POA: Diagnosis not present

## 2019-10-18 NOTE — Patient Instructions (Signed)
EXERCISE AND DIET:  We recommended that you start or continue a regular exercise program for good health. Regular exercise means any activity that makes your heart beat faster and makes you sweat.  We recommend exercising at least 30 minutes per day at least 3 days a week, preferably 4 or 5.  We also recommend a diet low in fat and sugar.  Inactivity, poor dietary choices and obesity can cause diabetes, heart attack, stroke, and kidney damage, among others.    ALCOHOL AND SMOKING:  Women should limit their alcohol intake to no more than 7 drinks/beers/glasses of wine (combined, not each!) per week. Moderation of alcohol intake to this level decreases your risk of breast cancer and liver damage. And of course, no recreational drugs are part of a healthy lifestyle.  And absolutely no smoking or even second hand smoke. Most people know smoking can cause heart and lung diseases, but did you know it also contributes to weakening of your bones? Aging of your skin?  Yellowing of your teeth and nails?  CALCIUM AND VITAMIN D:  Adequate intake of calcium and Vitamin D are recommended.  The recommendations for exact amounts of these supplements seem to change often, but generally speaking 1,200 mg of calcium (between diet and supplement) and 800 units of Vitamin D per day seems prudent. Certain women may benefit from higher intake of Vitamin D.  If you are among these women, your doctor will have told you during your visit.    PAP SMEARS:  Pap smears, to check for cervical cancer or precancers,  have traditionally been done yearly, although recent scientific advances have shown that most women can have pap smears less often.  However, every woman still should have a physical exam from her gynecologist every year. It will include a breast check, inspection of the vulva and vagina to check for abnormal growths or skin changes, a visual exam of the cervix, and then an exam to evaluate the size and shape of the uterus and  ovaries.  And after 75 years of age, a rectal exam is indicated to check for rectal cancers. We will also provide age appropriate advice regarding health maintenance, like when you should have certain vaccines, screening for sexually transmitted diseases, bone density testing, colonoscopy, mammograms, etc.   MAMMOGRAMS:  All women over 40 years old should have a yearly mammogram. Many facilities now offer a "3D" mammogram, which may cost around $50 extra out of pocket. If possible,  we recommend you accept the option to have the 3D mammogram performed.  It both reduces the number of women who will be called back for extra views which then turn out to be normal, and it is better than the routine mammogram at detecting truly abnormal areas.    COLON CANCER SCREENING: Now recommend starting at age 45. At this time colonoscopy is not covered for routine screening until 50. There are take home tests that can be done between 45-49.   COLONOSCOPY:  Colonoscopy to screen for colon cancer is recommended for all women at age 50.  We know, you hate the idea of the prep.  We agree, BUT, having colon cancer and not knowing it is worse!!  Colon cancer so often starts as a polyp that can be seen and removed at colonscopy, which can quite literally save your life!  And if your first colonoscopy is normal and you have no family history of colon cancer, most women don't have to have it again for   10 years.  Once every ten years, you can do something that may end up saving your life, right?  We will be happy to help you get it scheduled when you are ready.  Be sure to check your insurance coverage so you understand how much it will cost.  It may be covered as a preventative service at no cost, but you should check your particular policy.      Breast Self-Awareness Breast self-awareness means being familiar with how your breasts look and feel. It involves checking your breasts regularly and reporting any changes to your  health care provider. Practicing breast self-awareness is important. A change in your breasts can be a sign of a serious medical problem. Being familiar with how your breasts look and feel allows you to find any problems early, when treatment is more likely to be successful. All women should practice breast self-awareness, including women who have had breast implants. How to do a breast self-exam One way to learn what is normal for your breasts and whether your breasts are changing is to do a breast self-exam. To do a breast self-exam: Look for Changes  1. Remove all the clothing above your waist. 2. Stand in front of a mirror in a room with good lighting. 3. Put your hands on your hips. 4. Push your hands firmly downward. 5. Compare your breasts in the mirror. Look for differences between them (asymmetry), such as: ? Differences in shape. ? Differences in size. ? Puckers, dips, and bumps in one breast and not the other. 6. Look at each breast for changes in your skin, such as: ? Redness. ? Scaly areas. 7. Look for changes in your nipples, such as: ? Discharge. ? Bleeding. ? Dimpling. ? Redness. ? A change in position. Feel for Changes Carefully feel your breasts for lumps and changes. It is best to do this while lying on your back on the floor and again while sitting or standing in the shower or tub with soapy water on your skin. Feel each breast in the following way:  Place the arm on the side of the breast you are examining above your head.  Feel your breast with the other hand.  Start in the nipple area and make  inch (2 cm) overlapping circles to feel your breast. Use the pads of your three middle fingers to do this. Apply light pressure, then medium pressure, then firm pressure. The light pressure will allow you to feel the tissue closest to the skin. The medium pressure will allow you to feel the tissue that is a little deeper. The firm pressure will allow you to feel the tissue  close to the ribs.  Continue the overlapping circles, moving downward over the breast until you feel your ribs below your breast.  Move one finger-width toward the center of the body. Continue to use the  inch (2 cm) overlapping circles to feel your breast as you move slowly up toward your collarbone.  Continue the up and down exam using all three pressures until you reach your armpit.  Write Down What You Find  Write down what is normal for each breast and any changes that you find. Keep a written record with breast changes or normal findings for each breast. By writing this information down, you do not need to depend only on memory for size, tenderness, or location. Write down where you are in your menstrual cycle, if you are still menstruating. If you are having trouble noticing differences   in your breasts, do not get discouraged. With time you will become more familiar with the variations in your breasts and more comfortable with the exam. How often should I examine my breasts? Examine your breasts every month. If you are breastfeeding, the best time to examine your breasts is after a feeding or after using a breast pump. If you menstruate, the best time to examine your breasts is 5-7 days after your period is over. During your period, your breasts are lumpier, and it may be more difficult to notice changes. When should I see my health care provider? See your health care provider if you notice:  A change in shape or size of your breasts or nipples.  A change in the skin of your breast or nipples, such as a reddened or scaly area.  Unusual discharge from your nipples.  A lump or thick area that was not there before.  Pain in your breasts.  Anything that concerns you.  

## 2019-10-24 DIAGNOSIS — D2272 Melanocytic nevi of left lower limb, including hip: Secondary | ICD-10-CM | POA: Diagnosis not present

## 2019-10-24 DIAGNOSIS — L930 Discoid lupus erythematosus: Secondary | ICD-10-CM | POA: Diagnosis not present

## 2019-10-24 DIAGNOSIS — D2371 Other benign neoplasm of skin of right lower limb, including hip: Secondary | ICD-10-CM | POA: Diagnosis not present

## 2019-10-24 DIAGNOSIS — D2271 Melanocytic nevi of right lower limb, including hip: Secondary | ICD-10-CM | POA: Diagnosis not present

## 2019-10-24 DIAGNOSIS — L821 Other seborrheic keratosis: Secondary | ICD-10-CM | POA: Diagnosis not present

## 2019-10-24 DIAGNOSIS — L820 Inflamed seborrheic keratosis: Secondary | ICD-10-CM | POA: Diagnosis not present

## 2019-10-24 DIAGNOSIS — D2372 Other benign neoplasm of skin of left lower limb, including hip: Secondary | ICD-10-CM | POA: Diagnosis not present

## 2019-11-10 DIAGNOSIS — Z23 Encounter for immunization: Secondary | ICD-10-CM | POA: Diagnosis not present

## 2019-12-12 DIAGNOSIS — Z96611 Presence of right artificial shoulder joint: Secondary | ICD-10-CM | POA: Diagnosis not present

## 2019-12-14 DIAGNOSIS — Z96611 Presence of right artificial shoulder joint: Secondary | ICD-10-CM | POA: Diagnosis not present

## 2019-12-19 DIAGNOSIS — Z96611 Presence of right artificial shoulder joint: Secondary | ICD-10-CM | POA: Diagnosis not present

## 2019-12-21 DIAGNOSIS — Z96611 Presence of right artificial shoulder joint: Secondary | ICD-10-CM | POA: Diagnosis not present

## 2020-01-03 DIAGNOSIS — H04123 Dry eye syndrome of bilateral lacrimal glands: Secondary | ICD-10-CM | POA: Diagnosis not present

## 2020-01-03 DIAGNOSIS — E785 Hyperlipidemia, unspecified: Secondary | ICD-10-CM | POA: Diagnosis not present

## 2020-01-03 DIAGNOSIS — G629 Polyneuropathy, unspecified: Secondary | ICD-10-CM | POA: Diagnosis not present

## 2020-01-03 DIAGNOSIS — I700 Atherosclerosis of aorta: Secondary | ICD-10-CM | POA: Diagnosis not present

## 2020-01-03 DIAGNOSIS — M109 Gout, unspecified: Secondary | ICD-10-CM | POA: Diagnosis not present

## 2020-01-03 DIAGNOSIS — M8580 Other specified disorders of bone density and structure, unspecified site: Secondary | ICD-10-CM | POA: Diagnosis not present

## 2020-01-03 DIAGNOSIS — E538 Deficiency of other specified B group vitamins: Secondary | ICD-10-CM | POA: Diagnosis not present

## 2020-01-03 DIAGNOSIS — L930 Discoid lupus erythematosus: Secondary | ICD-10-CM | POA: Diagnosis not present

## 2020-01-03 DIAGNOSIS — I5189 Other ill-defined heart diseases: Secondary | ICD-10-CM | POA: Diagnosis not present

## 2020-01-03 DIAGNOSIS — E890 Postprocedural hypothyroidism: Secondary | ICD-10-CM | POA: Diagnosis not present

## 2020-01-03 DIAGNOSIS — D0510 Intraductal carcinoma in situ of unspecified breast: Secondary | ICD-10-CM | POA: Diagnosis not present

## 2020-01-03 DIAGNOSIS — I10 Essential (primary) hypertension: Secondary | ICD-10-CM | POA: Diagnosis not present

## 2020-01-16 ENCOUNTER — Other Ambulatory Visit: Payer: Self-pay

## 2020-01-16 ENCOUNTER — Ambulatory Visit
Admission: RE | Admit: 2020-01-16 | Discharge: 2020-01-16 | Disposition: A | Discharge status: Home / Self Care | Payer: Medicare Other | Source: Ambulatory Visit | Attending: Obstetrics and Gynecology | Admitting: Obstetrics and Gynecology

## 2020-01-16 DIAGNOSIS — E2839 Other primary ovarian failure: Secondary | ICD-10-CM

## 2020-01-16 DIAGNOSIS — M85852 Other specified disorders of bone density and structure, left thigh: Secondary | ICD-10-CM | POA: Diagnosis not present

## 2020-01-16 DIAGNOSIS — M810 Age-related osteoporosis without current pathological fracture: Secondary | ICD-10-CM | POA: Diagnosis not present

## 2020-01-16 DIAGNOSIS — Z780 Asymptomatic menopausal state: Secondary | ICD-10-CM | POA: Diagnosis not present

## 2020-01-16 DIAGNOSIS — Z8739 Personal history of other diseases of the musculoskeletal system and connective tissue: Secondary | ICD-10-CM

## 2020-01-21 ENCOUNTER — Ambulatory Visit (INDEPENDENT_AMBULATORY_CARE_PROVIDER_SITE_OTHER): Payer: Medicare Other | Admitting: Obstetrics and Gynecology

## 2020-01-21 ENCOUNTER — Other Ambulatory Visit: Payer: Self-pay

## 2020-01-21 ENCOUNTER — Encounter: Payer: Self-pay | Admitting: Obstetrics and Gynecology

## 2020-01-21 VITALS — BP 150/80 | HR 66 | Ht 62.0 in | Wt 122.4 lb

## 2020-01-21 DIAGNOSIS — M8580 Other specified disorders of bone density and structure, unspecified site: Secondary | ICD-10-CM | POA: Diagnosis not present

## 2020-01-21 NOTE — Patient Instructions (Signed)
Osteoporosis  Osteoporosis is thinning and loss of density in your bones. Osteoporosis makes bones more brittle and fragile and more likely to break (fracture). Over time, osteoporosis can cause your bones to become so weak that they fracture after a minor fall. Bones in the hip, wrist, and spine are most likely to fracture due to osteoporosis. What are the causes? The exact cause of this condition is not known. What increases the risk? You may be at greater risk for osteoporosis if you:  Have a family history of the condition.  Have poor nutrition.  Use steroid medicines, such as prednisone.  Are female.  Are age 54 or older.  Smoke or have a history of smoking.  Are not physically active (are sedentary).  Are white (Caucasian) or of Asian descent.  Have a small body frame.  Take certain medicines, such as antiseizure medicines. What are the signs or symptoms? A fracture might be the first sign of osteoporosis, especially if the fracture results from a fall or injury that usually would not cause a bone to break. Other signs and symptoms include:  Pain in the neck or low back.  Stooped posture.  Loss of height. How is this diagnosed? This condition may be diagnosed based on:  Your medical history.  A physical exam.  A bone mineral density test, also called a DXA or DEXA test (dual-energy X-ray absorptiometry test). This test uses X-rays to measure the amount of minerals in your bones. How is this treated? The goal of treatment is to strengthen your bones and lower your risk for a fracture. Treatment may involve:  Making lifestyle changes, such as: ? Including foods with more calcium and vitamin D in your diet. ? Doing weight-bearing and muscle-strengthening exercises. ? Stopping tobacco use. ? Limiting alcohol intake.  Taking medicine to slow the process of bone loss or to increase bone density.  Taking daily supplements of calcium and vitamin D.  Taking  hormone replacement medicines, such as estrogen for women and testosterone for men.  Monitoring your levels of calcium and vitamin D. Follow these instructions at home:  Activity  Exercise as told by your health care provider. Ask your health care provider what exercises and activities are safe for you. You should do: ? Exercises that make you work against gravity (weight-bearing exercises), such as tai chi, yoga, or walking. ? Exercises to strengthen muscles, such as lifting weights. Lifestyle  Limit alcohol intake to no more than 1 drink a day for nonpregnant women and 2 drinks a day for men. One drink equals 12 oz of beer, 5 oz of wine, or 1 oz of hard liquor.  Do not use any products that contain nicotine or tobacco, such as cigarettes and e-cigarettes. If you need help quitting, ask your health care provider. Preventing falls  Use devices to help you move around (mobility aids) as needed, such as canes, walkers, scooters, or crutches.  Keep rooms well-lit and clutter-free.  Remove tripping hazards from walkways, including cords and throw rugs.  Install grab bars in bathrooms and safety rails on stairs.  Use rubber mats in the bathroom and other areas that are often wet or slippery.  Wear closed-toe shoes that fit well and support your feet. Wear shoes that have rubber soles or low heels.  Review your medicines with your health care provider. Some medicines can cause dizziness or changes in blood pressure, which can increase your risk of falling. General instructions  Include calcium and vitamin D in  your diet. Calcium is important for bone health, and vitamin D helps your body to absorb calcium. Good sources of calcium and vitamin D include: ? Certain fatty fish, such as salmon and tuna. ? Products that have calcium and vitamin D added to them (fortified products), such as fortified cereals. ? Egg yolks. ? Cheese. ? Liver.  Take over-the-counter and prescription medicines  only as told by your health care provider.  Keep all follow-up visits as told by your health care provider. This is important. Contact a health care provider if:  You have never been screened for osteoporosis and you are: ? A woman who is age 48 or older. ? A man who is age 24 or older. Get help right away if:  You fall or injure yourself. Summary  Osteoporosis is thinning and loss of density in your bones. This makes bones more brittle and fragile and more likely to break (fracture),even with minor falls.  The goal of treatment is to strengthen your bones and reduce your risk for a fracture.  Include calcium and vitamin D in your diet. Calcium is important for bone health, and vitamin D helps your body to absorb calcium.  Talk with your health care provider about screening for osteoporosis if you are a woman who is age 37 or older, or a man who is age 37 or older. This information is not intended to replace advice given to you by your health care provider. Make sure you discuss any questions you have with your health care provider. Document Revised: 12/31/2016 Document Reviewed: 11/12/2016 Elsevier Patient Education  [employer]. Osteopenia  Osteopenia is a loss of thickness (density) inside of the bones. Another name for osteopenia is low bone mass. Mild osteopenia is a normal part of aging. It is not a disease, and it does not cause symptoms. However, if you have osteopenia and continue to lose bone mass, you could develop a condition that causes the bones to become thin and break more easily (osteoporosis). You may also lose some height, have back pain, and have a stooped posture. Although osteopenia is not a disease, making changes to your lifestyle and diet can help to prevent osteopenia from developing into osteoporosis. What are the causes? Osteopenia is caused by loss of calcium in the bones.  Bones are constantly changing. Old bone cells are continually being replaced  with new bone cells. This process builds new bone. The mineral calcium is needed to build new bone and maintain bone density. Bone density is usually highest around age 41. After that, most people's bodies cannot replace all the bone they have lost with new bone. What increases the risk? You are more likely to develop this condition if:  You are older than age 84.  You are a woman who went through menopause early.  You have a long illness that keeps you in bed.  You do not get enough exercise.  You lack certain nutrients (malnutrition).  You have an overactive thyroid gland (hyperthyroidism).  You smoke.  You drink a lot of alcohol.  You are taking medicines that weaken the bones, such as steroids. What are the signs or symptoms? This condition does not cause any symptoms. You may have a slightly higher risk for bone breaks (fractures), so getting fractures more easily than normal may be an indication of osteopenia. How is this diagnosed? Your health care provider can diagnose this condition with a special type of X-ray exam that measures bone density (dual-energy X-ray absorptiometry,  DEXA). This test can measure bone density in your hips, spine, and wrists. Osteopenia has no symptoms, so this condition is usually diagnosed after a routine bone density screening test is done for osteoporosis. This routine screening is usually done for:  Women who are age 6 or older.  Men who are age 42 or older. If you have risk factors for osteopenia, you may have the screening test at an earlier age. How is this treated? Making dietary and lifestyle changes can lower your risk for osteoporosis. If you have severe osteopenia that is close to becoming osteoporosis, your health care provider may prescribe medicines and dietary supplements such as calcium and vitamin D. These supplements help to rebuild bone density. Follow these instructions at home:   Take over-the-counter and prescription  medicines only as told by your health care provider. These include vitamins and supplements.  Eat a diet that is high in calcium and vitamin D. ? Calcium is found in dairy products, beans, salmon, and leafy green vegetables like spinach and broccoli. ? Look for foods that have vitamin D and calcium added to them (fortified foods), such as orange juice, cereal, and bread.  Do 30 or more minutes of a weight-bearing exercise every day, such as walking, jogging, or playing a sport. These types of exercises strengthen the bones.  Take precautions at home to lower your risk of falling, such as: ? Keeping rooms well-lit and free of clutter, such as cords. ? Installing safety rails on stairs. ? Using rubber mats in the bathroom or other areas that are often wet or slippery.  Do not use any products that contain nicotine or tobacco, such as cigarettes and e-cigarettes. If you need help quitting, ask your health care provider.  Avoid alcohol or limit alcohol intake to no more than 1 drink a day for nonpregnant women and 2 drinks a day for men. One drink equals 12 oz of beer, 5 oz of wine, or 1 oz of hard liquor.  Keep all follow-up visits as told by your health care provider. This is important. Contact a health care provider if:  You have not had a bone density screening for osteoporosis and you are: ? A woman, age 99 or older. ? A man, age 54 or older.  You are a postmenopausal woman who has not had a bone density screening for osteoporosis.  You are older than age 48 and you want to know if you should have bone density screening for osteoporosis. Summary  Osteopenia is a loss of thickness (density) inside of the bones. Another name for osteopenia is low bone mass.  Osteopenia is not a disease, but it may increase your risk for a condition that causes the bones to become thin and break more easily (osteoporosis).  You may be at risk for osteopenia if you are older than age 50 or if you are a  woman who went through early menopause.  Osteopenia does not cause any symptoms, but it can be diagnosed with a bone density screening test.  Dietary and lifestyle changes are the first treatment for osteopenia. These may lower your risk for osteoporosis. This information is not intended to replace advice given to you by your health care provider. Make sure you discuss any questions you have with your health care provider. Document Revised: 12/31/2016 Document Reviewed: 10/27/2016 Elsevier Patient Education  2020 [employer].

## 2020-01-21 NOTE — Progress Notes (Signed)
GYNECOLOGY  VISIT   HPI: 74 y.o.   Married White or Caucasian Not Hispanic or Latino  female   G2P2 with Patient's last menstrual period was 01/02/2000.   here for bone density consult.  She is on Tamoxifen (has been on it for 5 years). She is having some discharge (long term), no change, no itching, burning or irritation. H/O hysterectomy.   Recent DEXA returned with a T score of -2.1, FRAX 19.2/5.1. On synthroid.    GYNECOLOGIC HISTORY: Patient's last menstrual period was 01/02/2000. Contraception:none  Menopausal hormone therapy: none         OB History    Gravida  2   Para  2   Term      Preterm      AB      Living  2     SAB      IAB      Ectopic      Multiple      Live Births  2              Patient Active Problem List   Diagnosis Date Noted   Arthritis of hand 09/18/2019   Raynaud's disease 09/18/2019   Carpal tunnel syndrome of right wrist 08/16/2019   Subacromial bursitis of left shoulder joint 11/16/2018   Impingement syndrome of left shoulder region 11/16/2018   Low back pain 10/02/2017   Upper airway cough syndrome 01/14/2017   Ductal carcinoma in situ of right breast 10/11/2016   Carcinoma of upper-outer quadrant of left breast in female, estrogen receptor positive ([place]) 09/01/2016   Malignant neoplasm of overlapping sites of left breast in female, estrogen receptor positive ([place]) 06/22/2016   Fracture closed, fibula, shaft 06/02/2016   Acute upper respiratory infection 05/24/2016   Intermittent asthma without complication 29/79/8921   Genital atrophy of female 11/08/2014   History of breast cancer 12/25/2012   Heart murmur 08/09/2012   Hypothyroid 08/09/2012   Ductal carcinoma in situ (DCIS) of right breast 07/02/2012   Cystocele 05/24/2012   HTN (hypertension) 02/25/2009   RHINITIS, VASOMOTOR 02/15/2007   Asthma 02/15/2007    Past Medical History:  Diagnosis Date   Acute asthmatic bronchitis    Asthma,  mild intermittent    Breast cancer ([place]) 08/2012   right-DCIS, +ER/PR   Cystocele    Grade 2   Diverticulosis 2004   Fibroids 1998   Fracture of fibula 2007   left;  hit with golf ball   Heart murmur    nl echo   History of radiation therapy 09/16/2016- 10/08/2016   Left Breast. 42.56 Gy in 16 fractions.    History of TMJ disorder    Hx of radiation therapy 10/11/12- 11/13/12   right breast 4800 cGy 24 sessions   Hypertension    Hypothyroidism    Osteopenia 2002   Personal history of radiation therapy 2014   right br   Personal history of radiation therapy 2018   left br   Raynaud's disease    Shingles    Vasomotor rhinitis     Past Surgical History:  Procedure Laterality Date   ABDOMINAL HYSTERECTOMY     BREAST BIOPSY Left 2015   MRI- Benign   BREAST BIOPSY Right 07/05/2012   Stereo- Malignant   BREAST EXCISIONAL BIOPSY Left    BREAST EXCISIONAL BIOPSY Left    BREAST LUMPECTOMY Right 08/10/12   [facility], NO RESIDUAL DISEASE   HYSTEROSCOPY WITH RESECTOSCOPE  2002   submucous fibroid   INCONTINENCE  SURGERY     MINOR BREAST BIOPSY Left    times two   reverse shoulder replacement Right 03/2019   ROTATOR CUFF REPAIR Right 2010   THYROIDECTOMY      Current Outpatient Medications  Medication Sig Dispense Refill   cholecalciferol (VITAMIN D) 1000 UNITS tablet Take 1,000 Units by mouth 2 (two) times a week.     levothyroxine (SYNTHROID, LEVOTHROID) 75 MCG tablet Take 75 mcg by mouth. 6 days a week take 1 pill & 1 day a week take 1/2 a pill     loratadine (CLARITIN) 5 MG chewable tablet Chew 5 mg by mouth daily. Uses as needed     meloxicam (MOBIC) 15 MG tablet Take 15 mg by mouth daily.     Probiotic Product (PROBIOTIC PO) Take by mouth.     tamoxifen (NOLVADEX) 10 MG tablet Take 1 tablet (10 mg total) by mouth daily. 90 tablet 4   valACYclovir (VALTREX) 500 MG tablet TAKE 1 TABLET BY MOUTH DAILY FOR SUPRESSION, INCREASE TO TWICE DAILY FOR 3  DAYS WITH OUTBREAK 45 tablet 2   zolpidem (AMBIEN) 5 MG tablet Take 5 mg by mouth at bedtime as needed for sleep.     No current facility-administered medications for this visit.     ALLERGIES: Pneumovax 23  [pneumococcal vac polyvalent], Effexor [venlafaxine], Penicillins, and Pneumovax [pneumococcal polysaccharide vaccine]  Family History  Problem Relation Age of Onset   Aneurysm Mother    Hypertension Sister    Cancer Sister        tongue   Clotting disorder Maternal Grandfather        liver   Cancer Maternal Grandfather        liver   Breast cancer Neg Hx     Social History   Socioeconomic History   Marital status: Married    Spouse name: Not on file   Number of children: 2   Years of education: Not on file   Highest education level: Not on file  Occupational History   Occupation: homemaker  Tobacco Use   Smoking status: Never Smoker   Smokeless tobacco: Never Used  Substance and Sexual Activity   Alcohol use: Yes    Alcohol/week: 6.0 standard drinks    Types: 6 Standard drinks or equivalent per week   Drug use: No   Sexual activity: Not Currently    Partners: Male    Birth control/protection: Post-menopausal, Surgical    Comment: hysterectomy  Other Topics Concern   Not on file  Social History Narrative   Not on file   Social Determinants of Health   Financial Resource Strain: Not on file  Food Insecurity: Not on file  Transportation Needs: Not on file  Physical Activity: Not on file  Stress: Not on file  Social Connections: Not on file  Intimate Partner Violence: Not on file    ROS  PHYSICAL EXAMINATION:    BP (!) 150/80    Pulse 66    Ht 5\' 2"  (1.575 m)    Wt 122 lb 6.4 oz (55.5 kg)    LMP 01/02/2000    SpO2 98%    BMI 22.39 kg/m     General appearance: alert, cooperative and appears stated age   ASSESSMENT Osteopenia with an elevated risk of fracture.     PLAN Discussed calcium 1,200 mg a day She is on a vit d  supplement  Continue weight bearing exercise Discussed starting a treatment. Reviewed Fosamax, no contraindications Will get a copy  of her labs from her primary She would also like to discuss with Dr Forde Dandy.    An After Visit Summary was printed and given to the patient.  Information from up to date was also given.   ~15 minutes in total spent in patient care.

## 2020-02-02 DIAGNOSIS — H269 Unspecified cataract: Secondary | ICD-10-CM

## 2020-02-02 HISTORY — DX: Unspecified cataract: H26.9

## 2020-02-08 DIAGNOSIS — Z96611 Presence of right artificial shoulder joint: Secondary | ICD-10-CM | POA: Diagnosis not present

## 2020-02-11 DIAGNOSIS — Z96611 Presence of right artificial shoulder joint: Secondary | ICD-10-CM | POA: Diagnosis not present

## 2020-02-18 DIAGNOSIS — M25611 Stiffness of right shoulder, not elsewhere classified: Secondary | ICD-10-CM | POA: Diagnosis not present

## 2020-02-18 DIAGNOSIS — M6281 Muscle weakness (generalized): Secondary | ICD-10-CM | POA: Diagnosis not present

## 2020-02-18 DIAGNOSIS — Z96611 Presence of right artificial shoulder joint: Secondary | ICD-10-CM | POA: Diagnosis not present

## 2020-02-18 DIAGNOSIS — R202 Paresthesia of skin: Secondary | ICD-10-CM | POA: Diagnosis not present

## 2020-02-18 DIAGNOSIS — M79641 Pain in right hand: Secondary | ICD-10-CM | POA: Diagnosis not present

## 2020-02-24 ENCOUNTER — Encounter: Payer: Self-pay | Admitting: Obstetrics and Gynecology

## 2020-02-25 DIAGNOSIS — Z96611 Presence of right artificial shoulder joint: Secondary | ICD-10-CM | POA: Diagnosis not present

## 2020-02-25 NOTE — Telephone Encounter (Signed)
Can you please get a copy of the patients labs from Dr Forde Dandy. Need to make sure her vit d level is okay, also looking for CBC, CMP. Thanks.  Once we have this (if it's all okay), we can send in the script for Fosamax as she requests.

## 2020-02-26 DIAGNOSIS — M25611 Stiffness of right shoulder, not elsewhere classified: Secondary | ICD-10-CM | POA: Diagnosis not present

## 2020-02-26 DIAGNOSIS — M79641 Pain in right hand: Secondary | ICD-10-CM | POA: Diagnosis not present

## 2020-02-26 DIAGNOSIS — R202 Paresthesia of skin: Secondary | ICD-10-CM | POA: Diagnosis not present

## 2020-02-26 DIAGNOSIS — M6281 Muscle weakness (generalized): Secondary | ICD-10-CM | POA: Diagnosis not present

## 2020-02-26 NOTE — Telephone Encounter (Signed)
I called Dr. Baldwin Crown office and left a message in [place] ([facility]?) voice mail requesting labs be faxed. Fax # provided.

## 2020-02-27 MED ORDER — ALENDRONATE SODIUM 70 MG PO TABS: 70.0000 mg | ORAL_TABLET | ORAL | 3 refills | Status: DC

## 2020-02-27 NOTE — Telephone Encounter (Signed)
I have put her labs on your desk. They were done 06/13/2019.

## 2020-03-04 DIAGNOSIS — M6281 Muscle weakness (generalized): Secondary | ICD-10-CM | POA: Diagnosis not present

## 2020-03-04 DIAGNOSIS — M25611 Stiffness of right shoulder, not elsewhere classified: Secondary | ICD-10-CM | POA: Diagnosis not present

## 2020-03-04 DIAGNOSIS — R202 Paresthesia of skin: Secondary | ICD-10-CM | POA: Diagnosis not present

## 2020-03-04 DIAGNOSIS — M79641 Pain in right hand: Secondary | ICD-10-CM | POA: Diagnosis not present

## 2020-03-05 DIAGNOSIS — Z96611 Presence of right artificial shoulder joint: Secondary | ICD-10-CM | POA: Diagnosis not present

## 2020-03-05 DIAGNOSIS — Z471 Aftercare following joint replacement surgery: Secondary | ICD-10-CM | POA: Diagnosis not present

## 2020-03-05 DIAGNOSIS — G5601 Carpal tunnel syndrome, right upper limb: Secondary | ICD-10-CM | POA: Diagnosis not present

## 2020-03-05 DIAGNOSIS — M25511 Pain in right shoulder: Secondary | ICD-10-CM | POA: Diagnosis not present

## 2020-03-10 DIAGNOSIS — Z96611 Presence of right artificial shoulder joint: Secondary | ICD-10-CM | POA: Diagnosis not present

## 2020-03-11 DIAGNOSIS — R202 Paresthesia of skin: Secondary | ICD-10-CM | POA: Diagnosis not present

## 2020-03-11 DIAGNOSIS — M6281 Muscle weakness (generalized): Secondary | ICD-10-CM | POA: Diagnosis not present

## 2020-03-11 DIAGNOSIS — M25611 Stiffness of right shoulder, not elsewhere classified: Secondary | ICD-10-CM | POA: Diagnosis not present

## 2020-03-11 DIAGNOSIS — M79641 Pain in right hand: Secondary | ICD-10-CM | POA: Diagnosis not present

## 2020-03-18 DIAGNOSIS — M25611 Stiffness of right shoulder, not elsewhere classified: Secondary | ICD-10-CM | POA: Diagnosis not present

## 2020-03-18 DIAGNOSIS — R202 Paresthesia of skin: Secondary | ICD-10-CM | POA: Diagnosis not present

## 2020-03-18 DIAGNOSIS — M79641 Pain in right hand: Secondary | ICD-10-CM | POA: Diagnosis not present

## 2020-03-18 DIAGNOSIS — M6281 Muscle weakness (generalized): Secondary | ICD-10-CM | POA: Diagnosis not present

## 2020-04-29 DIAGNOSIS — R202 Paresthesia of skin: Secondary | ICD-10-CM | POA: Diagnosis not present

## 2020-04-29 DIAGNOSIS — M6281 Muscle weakness (generalized): Secondary | ICD-10-CM | POA: Diagnosis not present

## 2020-04-29 DIAGNOSIS — M79641 Pain in right hand: Secondary | ICD-10-CM | POA: Diagnosis not present

## 2020-04-29 DIAGNOSIS — M25611 Stiffness of right shoulder, not elsewhere classified: Secondary | ICD-10-CM | POA: Diagnosis not present

## 2020-05-21 ENCOUNTER — Other Ambulatory Visit: Payer: Self-pay | Admitting: Oncology

## 2020-05-21 DIAGNOSIS — Z1231 Encounter for screening mammogram for malignant neoplasm of breast: Secondary | ICD-10-CM

## 2020-05-22 ENCOUNTER — Encounter: Payer: Self-pay | Admitting: Oncology

## 2020-05-22 ENCOUNTER — Other Ambulatory Visit: Payer: Self-pay

## 2020-05-22 MED ORDER — TAMOXIFEN CITRATE 10 MG PO TABS: 10.0000 mg | ORAL_TABLET | Freq: Every day | ORAL | 0 refills | Status: DC

## 2020-06-05 ENCOUNTER — Encounter: Payer: Self-pay | Admitting: Obstetrics and Gynecology

## 2020-06-05 DIAGNOSIS — H04123 Dry eye syndrome of bilateral lacrimal glands: Secondary | ICD-10-CM | POA: Diagnosis not present

## 2020-06-16 DIAGNOSIS — H04123 Dry eye syndrome of bilateral lacrimal glands: Secondary | ICD-10-CM | POA: Diagnosis not present

## 2020-06-18 DIAGNOSIS — M109 Gout, unspecified: Secondary | ICD-10-CM | POA: Diagnosis not present

## 2020-06-18 DIAGNOSIS — E890 Postprocedural hypothyroidism: Secondary | ICD-10-CM | POA: Diagnosis not present

## 2020-06-18 DIAGNOSIS — E785 Hyperlipidemia, unspecified: Secondary | ICD-10-CM | POA: Diagnosis not present

## 2020-06-18 DIAGNOSIS — E538 Deficiency of other specified B group vitamins: Secondary | ICD-10-CM | POA: Diagnosis not present

## 2020-06-19 ENCOUNTER — Ambulatory Visit (INDEPENDENT_AMBULATORY_CARE_PROVIDER_SITE_OTHER): Payer: Medicare Other | Admitting: Obstetrics and Gynecology

## 2020-06-19 ENCOUNTER — Other Ambulatory Visit: Payer: Self-pay

## 2020-06-19 ENCOUNTER — Encounter: Payer: Self-pay | Admitting: Obstetrics and Gynecology

## 2020-06-19 VITALS — BP 110/70 | HR 67

## 2020-06-19 DIAGNOSIS — N898 Other specified noninflammatory disorders of vagina: Secondary | ICD-10-CM | POA: Diagnosis not present

## 2020-06-19 DIAGNOSIS — M8580 Other specified disorders of bone density and structure, unspecified site: Secondary | ICD-10-CM

## 2020-06-19 DIAGNOSIS — Z853 Personal history of malignant neoplasm of breast: Secondary | ICD-10-CM | POA: Diagnosis not present

## 2020-06-19 LAB — WET PREP FOR TRICH, YEAST, CLUE

## 2020-06-19 NOTE — Progress Notes (Signed)
GYNECOLOGY  VISIT   HPI: 75 y.o.   Married White or Caucasian Not Hispanic or Latino  female   G2P2 with Patient's last menstrual period was 01/02/2000.   here for vaginal discharge and hot flushes. She has a h/o bilateral breast cancer and is on Tamoxifen.   She was on fosamax for 6 weeks, stopped it secondary to bad hot flashes. Hot flashes improved some after stopping the fosamax. DEXA from 01/16/20 returned with a T score of -2.1, FRAX 19.2/5.1.  Her hot flashes wax and wane, currently manageable. Feels she can tolerate it off of Fosamax.  She also has issues with long term vaginal discharge from tamoxifen. H/O hysterectomy. She has been using a gel externally with boric acid, using it externally only. She has a lot vaginal discharge, yellow/brown. She has had a hysterectomy. No itching, burning or irritation.   She is having a mammogram on 07/04/20, feeling anxious. Requests breast exam.   GYNECOLOGIC HISTORY: Patient's last menstrual period was 01/02/2000. Contraception: hysterectomy Menopausal hormone therapy: none        OB History    Gravida  2   Para  2   Term      Preterm      AB      Living  2     SAB      IAB      Ectopic      Multiple      Live Births  2              Patient Active Problem List   Diagnosis Date Noted  . Arthritis of hand 09/18/2019  . Raynaud's disease 09/18/2019  . Carpal tunnel syndrome of right wrist 08/16/2019  . Subacromial bursitis of left shoulder joint 11/16/2018  . Impingement syndrome of left shoulder region 11/16/2018  . Low back pain 10/02/2017  . Upper airway cough syndrome 01/14/2017  . Ductal carcinoma in situ of right breast 10/11/2016  . Carcinoma of upper-outer quadrant of left breast in female, estrogen receptor positive ([place]) 09/01/2016  . Malignant neoplasm of overlapping sites of left breast in female, estrogen receptor positive ([place]) 06/22/2016  . Fracture closed, fibula, shaft 06/02/2016  . Acute  upper respiratory infection 05/24/2016  . Intermittent asthma without complication 99/24/2683  . Genital atrophy of female 11/08/2014  . History of breast cancer 12/25/2012  . Heart murmur 08/09/2012  . Hypothyroid 08/09/2012  . Ductal carcinoma in situ (DCIS) of right breast 07/02/2012  . Cystocele 05/24/2012  . HTN (hypertension) 02/25/2009  . RHINITIS, VASOMOTOR 02/15/2007  . Asthma 02/15/2007    Past Medical History:  Diagnosis Date  . Acute asthmatic bronchitis   . Asthma, mild intermittent   . Breast cancer ([place]) 08/2012   right-DCIS, +ER/PR  . Cystocele    Grade 2  . Diverticulosis 2004  . Fibroids 1998  . Fracture of fibula 2007   left;  hit with golf ball  . Heart murmur    nl echo  . History of radiation therapy 09/16/2016- 10/08/2016   Left Breast. 42.56 Gy in 16 fractions.   . History of TMJ disorder   . Hx of radiation therapy 10/11/12- 11/13/12   right breast 4800 cGy 24 sessions  . Hypertension   . Hypothyroidism   . Osteopenia 2002  . Personal history of radiation therapy 2014   right br  . Personal history of radiation therapy 2018   left br  . Raynaud's disease   . Shingles   .  Vasomotor rhinitis     Past Surgical History:  Procedure Laterality Date  . ABDOMINAL HYSTERECTOMY    . BREAST BIOPSY Left 2015   MRI- Benign  . BREAST BIOPSY Right 07/05/2012   Stereo- Malignant  . BREAST EXCISIONAL BIOPSY Left   . BREAST EXCISIONAL BIOPSY Left   . BREAST LUMPECTOMY Right 08/10/12   [facility], NO RESIDUAL DISEASE  . HYSTEROSCOPY WITH RESECTOSCOPE  2002   submucous fibroid  . INCONTINENCE SURGERY    . MINOR BREAST BIOPSY Left    times two  . reverse shoulder replacement Right 03/2019  . ROTATOR CUFF REPAIR Right 2010  . THYROIDECTOMY      Current Outpatient Medications  Medication Sig Dispense Refill  . alendronate (FOSAMAX) 70 MG tablet Take 1 tablet (70 mg total) by mouth every 7 (seven) days. Take first thing in am with 6 oz. Water.  Be upright after  taking.  Eat nothing for one hour. 12 tablet 3  . cholecalciferol (VITAMIN D) 1000 UNITS tablet Take 1,000 Units by mouth 2 (two) times a week.    . levothyroxine (SYNTHROID, LEVOTHROID) 75 MCG tablet Take 75 mcg by mouth. 6 days a week take 1 pill & 1 day a week take 1/2 a pill    . loratadine (CLARITIN) 5 MG chewable tablet Chew 5 mg by mouth daily. Uses as needed    . meloxicam (MOBIC) 15 MG tablet Take 15 mg by mouth daily.    . Probiotic Product (PROBIOTIC PO) Take by mouth.    . tamoxifen (NOLVADEX) 10 MG tablet Take 1 tablet (10 mg total) by mouth daily. 90 tablet 0  . valACYclovir (VALTREX) 500 MG tablet TAKE 1 TABLET BY MOUTH DAILY FOR SUPRESSION, INCREASE TO TWICE DAILY FOR 3 DAYS WITH OUTBREAK 45 tablet 2  . zolpidem (AMBIEN) 5 MG tablet Take 5 mg by mouth at bedtime as needed for sleep.     No current facility-administered medications for this visit.     ALLERGIES: Pneumovax 23  [pneumococcal vac polyvalent], Effexor [venlafaxine], Penicillins, and Pneumovax [pneumococcal polysaccharide vaccine]  Family History  Problem Relation Age of Onset  . Aneurysm Mother   . Hypertension Sister   . Cancer Sister        tongue  . Clotting disorder Maternal Grandfather        liver  . Cancer Maternal Grandfather        liver  . Breast cancer Neg Hx     Social History   Socioeconomic History  . Marital status: Married    Spouse name: Not on file  . Number of children: 2  . Years of education: Not on file  . Highest education level: Not on file  Occupational History  . Occupation: homemaker  Tobacco Use  . Smoking status: Never Smoker  . Smokeless tobacco: Never Used  Substance and Sexual Activity  . Alcohol use: Yes    Alcohol/week: 6.0 standard drinks    Types: 6 Standard drinks or equivalent per week  . Drug use: No  . Sexual activity: Not Currently    Partners: Male    Birth control/protection: Post-menopausal, Surgical    Comment: hysterectomy  Other Topics Concern   . Not on file  Social History Narrative  . Not on file   Social Determinants of Health   Financial Resource Strain: Not on file  Food Insecurity: Not on file  Transportation Needs: Not on file  Physical Activity: Not on file  Stress: Not on file  Social Connections: Not on file  Intimate Partner Violence: Not on file    Review of Systems  Constitutional: Negative.   HENT: Negative.   Eyes: Negative.   Respiratory: Negative.   Cardiovascular: Negative.   Gastrointestinal: Negative.   Genitourinary: Negative.   Musculoskeletal: Negative.   Skin: Negative.   Neurological: Negative.   Endo/Heme/Allergies: Negative.   Psychiatric/Behavioral: Negative.     PHYSICAL EXAMINATION:    LMP 01/02/2000     General appearance: alert, cooperative and appears stated age Breasts: normal appearance, no masses or tenderness, evidence of bilateral lumpectomies  Pelvic: External genitalia:  no lesions              Urethra:  normal appearing urethra with no masses, tenderness or lesions              Bartholins and Skenes: normal                 Vagina: mildly atrophic appearing vagina with normal color and discharge, no lesions              Cervix: no lesions               Chaperone was present for exam.  1. Vaginal discharge No concerning findings on exam - WET PREP FOR [place], YEAST, CLUE: negative -try replens vaginal moisturizer  2. History of breast cancer On tamoxifen  3. Osteopenia with high risk of fracture Didn't tolerate fosamax. She will f/u with Dr Forde Dandy  Over 20 minutes in total patient care.

## 2020-06-19 NOTE — Patient Instructions (Signed)
Try replens vaginal moisturizer.  

## 2020-06-23 DIAGNOSIS — Z1211 Encounter for screening for malignant neoplasm of colon: Secondary | ICD-10-CM | POA: Diagnosis not present

## 2020-06-23 DIAGNOSIS — Z1212 Encounter for screening for malignant neoplasm of rectum: Secondary | ICD-10-CM | POA: Diagnosis not present

## 2020-06-25 DIAGNOSIS — D0510 Intraductal carcinoma in situ of unspecified breast: Secondary | ICD-10-CM | POA: Diagnosis not present

## 2020-06-25 DIAGNOSIS — M8580 Other specified disorders of bone density and structure, unspecified site: Secondary | ICD-10-CM | POA: Diagnosis not present

## 2020-06-25 DIAGNOSIS — G629 Polyneuropathy, unspecified: Secondary | ICD-10-CM | POA: Diagnosis not present

## 2020-06-25 DIAGNOSIS — Z1389 Encounter for screening for other disorder: Secondary | ICD-10-CM | POA: Diagnosis not present

## 2020-06-25 DIAGNOSIS — J45909 Unspecified asthma, uncomplicated: Secondary | ICD-10-CM | POA: Diagnosis not present

## 2020-06-25 DIAGNOSIS — E890 Postprocedural hypothyroidism: Secondary | ICD-10-CM | POA: Diagnosis not present

## 2020-06-25 DIAGNOSIS — E785 Hyperlipidemia, unspecified: Secondary | ICD-10-CM | POA: Diagnosis not present

## 2020-06-25 DIAGNOSIS — R82998 Other abnormal findings in urine: Secondary | ICD-10-CM | POA: Diagnosis not present

## 2020-06-25 DIAGNOSIS — Z0000 Encounter for general adult medical examination without abnormal findings: Secondary | ICD-10-CM | POA: Diagnosis not present

## 2020-06-25 DIAGNOSIS — I5189 Other ill-defined heart diseases: Secondary | ICD-10-CM | POA: Diagnosis not present

## 2020-06-25 DIAGNOSIS — Z1331 Encounter for screening for depression: Secondary | ICD-10-CM | POA: Diagnosis not present

## 2020-06-25 DIAGNOSIS — I700 Atherosclerosis of aorta: Secondary | ICD-10-CM | POA: Diagnosis not present

## 2020-06-25 DIAGNOSIS — I10 Essential (primary) hypertension: Secondary | ICD-10-CM | POA: Diagnosis not present

## 2020-07-01 ENCOUNTER — Other Ambulatory Visit: Payer: Self-pay | Admitting: Oncology

## 2020-07-01 DIAGNOSIS — Z853 Personal history of malignant neoplasm of breast: Secondary | ICD-10-CM

## 2020-07-01 LAB — COLOGUARD: COLOGUARD: NEGATIVE

## 2020-07-04 ENCOUNTER — Ambulatory Visit
Admission: RE | Admit: 2020-07-04 | Discharge: 2020-07-04 | Disposition: A | Discharge status: Home / Self Care | Payer: Medicare Other | Source: Ambulatory Visit | Attending: Oncology | Admitting: Oncology

## 2020-07-04 ENCOUNTER — Other Ambulatory Visit: Payer: Self-pay

## 2020-07-04 DIAGNOSIS — R922 Inconclusive mammogram: Secondary | ICD-10-CM | POA: Diagnosis not present

## 2020-07-04 DIAGNOSIS — Z853 Personal history of malignant neoplasm of breast: Secondary | ICD-10-CM | POA: Diagnosis not present

## 2020-08-19 ENCOUNTER — Other Ambulatory Visit: Payer: Self-pay | Admitting: Oncology

## 2020-08-19 NOTE — Telephone Encounter (Signed)
Patient is requesting refill on tamoxifen.  She has not seen our office in over one year.  We can give a one time 30 day supply, however patient needs to come in for a follow up visit.    Wilber Bihari, NP

## 2020-08-22 ENCOUNTER — Other Ambulatory Visit: Payer: Self-pay | Admitting: *Deleted

## 2020-08-22 MED ORDER — TAMOXIFEN CITRATE 10 MG PO TABS: 10.0000 mg | ORAL_TABLET | Freq: Every day | ORAL | 0 refills | Status: DC

## 2020-08-22 NOTE — Telephone Encounter (Signed)
Pt left message [place] the beach presently and was notified her prescription refill is ready for pick up locally in [place]. She is wanting to pick up " at the [facility] [place] the [facility] " but did not state which beach she is at.  Per return call to her this RN obtained VM- message left stating need to know if she is at [facility] or [facility]- which both [facility] are listed in her chart as well as her local [facility].  This RN also informed her - if she called the [facility] she wants to pick it up from they may be able to transfer the prescription. This RN requested her to call again if needed for [place] to resend to appropriate pharmacy.

## 2020-10-14 DIAGNOSIS — R051 Acute cough: Secondary | ICD-10-CM | POA: Diagnosis not present

## 2020-10-14 DIAGNOSIS — Z1152 Encounter for screening for COVID-19: Secondary | ICD-10-CM | POA: Diagnosis not present

## 2020-10-14 DIAGNOSIS — H04123 Dry eye syndrome of bilateral lacrimal glands: Secondary | ICD-10-CM | POA: Diagnosis not present

## 2020-10-14 DIAGNOSIS — J029 Acute pharyngitis, unspecified: Secondary | ICD-10-CM | POA: Diagnosis not present

## 2020-10-14 DIAGNOSIS — J069 Acute upper respiratory infection, unspecified: Secondary | ICD-10-CM | POA: Diagnosis not present

## 2020-10-14 DIAGNOSIS — R5383 Other fatigue: Secondary | ICD-10-CM | POA: Diagnosis not present

## 2020-10-30 DIAGNOSIS — L930 Discoid lupus erythematosus: Secondary | ICD-10-CM | POA: Diagnosis not present

## 2020-10-30 DIAGNOSIS — B029 Zoster without complications: Secondary | ICD-10-CM | POA: Diagnosis not present

## 2020-11-07 DIAGNOSIS — H04123 Dry eye syndrome of bilateral lacrimal glands: Secondary | ICD-10-CM | POA: Diagnosis not present

## 2020-11-07 DIAGNOSIS — H26493 Other secondary cataract, bilateral: Secondary | ICD-10-CM | POA: Diagnosis not present

## 2020-11-08 DIAGNOSIS — Z23 Encounter for immunization: Secondary | ICD-10-CM | POA: Diagnosis not present

## 2020-11-10 DIAGNOSIS — H26492 Other secondary cataract, left eye: Secondary | ICD-10-CM | POA: Diagnosis not present

## 2020-11-23 ENCOUNTER — Other Ambulatory Visit: Payer: Self-pay | Admitting: Oncology

## 2020-12-29 DIAGNOSIS — H26491 Other secondary cataract, right eye: Secondary | ICD-10-CM | POA: Diagnosis not present

## 2021-01-04 NOTE — Progress Notes (Signed)
[name] [name]  [phone] [phone]     ID: [name] [name] DOB: [birth year]  MR#: [id number]  GEX#:[id number]  Patient Care Team: Reynold Bowen, MD as PCP - General (Endocrinology) Minas Bonser, Virgie Dad, MD as Consulting Physician (Oncology) Willodean Rosenthal, MD as Referring Physician (Surgery) Kimmick, Linus Mako, MD as Referring Physician (Oncology) Eppie Gibson, MD as Attending Physician (Radiation Oncology) Salvadore Dom, MD as Consulting Physician (Obstetrics and Gynecology) OTHER MD:  CHIEF COMPLAINT: Estrogen receptor positive breast cancer  CURRENT TREATMENT: tamoxifen 10 mg   INTERVAL HISTORY: [name] [name] returns today for follow-up of her estrogen receptor positive breast cancer.   She was switched back to tamoxifen on 01/03/2019.  We cut the dose down to 10 mg daily.  At the same time she stopped the testosterone and the estrogen.  She is now tolerating tamoxifen well, with mild vaginal discharge as the only side effect.  She does not need a pad.  She tells me she saw Dr. Talbert Nan since the last visit here and exam was "fine"  Since her last visit, she underwent bone density screening on 01/16/2020 showing a T-score of -2.1, which is considered osteopenic.  She also underwent bilateral diagnostic mammography with tomography at [facility] on 07/04/2020 showing: breast density category C; no evidence of malignancy in either breast.    REVIEW OF SYSTEMS: [name] [name] exercises daily.  She either plays golf, or does a gym video, or she does weights or she walks.  She enjoys having her grandchildren over now that she has moved to [place] and have a warm pool.  She has some arthralgias and myalgias consistent with age.  She tells me she had shingles earlier this year.  She has not yet had the shingles vaccine.  A detailed review of systems was otherwise stable   COVID 19 VACCINATION STATUS: Pfizer x4   HISTORY OF CURRENT  ILLNESS: From the original intake note:  "[name] [name]" underwent right breast upper inner quadrant lumpectomy for ductal carcinoma in situ, 08/10/2012. She was treated with adjuvant radiation to a total of 48 gray, completed 11/13/2012.  MRI guided biopsy of a central left breast lesion 05/25/2013 showed sclerosing adenosis.  On 07/30/2015 she underwent bilateral diagnostic mammography with tomography. The breast density was category C. In addition to prior scars there was a 0.3 cm area of dystrophic calcifications. This was felt to be likely benign and 6 month follow-up was recommended.  That was performed 01/15/2016. The calcifications now appeared to be associated with an oil cyst.   More recently, in April 2018 the patient's gynecologist palpated a change in the left breast, at the 3:00 area, 3 fingerbreadths from the areola. Left diagnostic mammography and ultrasonography at [facility] 06/03/2016 showed only changes of prior excisional biopsies. On physical exam however there was a firm thickening in the lateral left breast, at the site of a prior biopsy. Targeted ultrasonography of this area (3:00 radiant 5 cm from the nipple) found an irregular hypoechoic region measuring 0.8 cm. This underlying the scar. Ultrasound of the left axilla was benign.  Biopsy of the left breast area in question 06/09/2016 showed (S [id number]) and invasive ductal carcinoma, E-cadherin positive, estrogen receptor 80% positive, progesterone receptor 60% positive, both with strong staining intensity, with an MIB-1 of 15% and no HER-2 amplification, the signals ratio being 1.00 and the number per cell 1.15.  The patient then was further evaluated at [facility] and on 07/12/2016 underwent left lumpectomy and sentinel lymph  node sampling, with the final pathology ([id number]) finding invasive adenocarcinoma with mixed lobular and ductal features, but E-cadherin positive, grade 2, measuring 1.9 cm. Both sentinel lymph nodes  were benign.  An Oncotype DX score was obtained on this sample, and it was 21; the patient was referred to radiation oncology, which was done in [place] and completed 10/08/2016.  The patient's subsequent history is as detailed below.   PAST MEDICAL HISTORY: Past Medical History:  Diagnosis Date   Acute asthmatic bronchitis    Asthma, mild intermittent    Breast cancer ([place]) 08/2012   right-DCIS, +ER/PR   Cystocele    Grade 2   Diverticulosis 2004   Fibroids 1998   Fracture of fibula 2007   left;  hit with golf ball   Heart murmur    nl echo   History of radiation therapy 09/16/2016- 10/08/2016   Left Breast. 42.56 Gy in 16 fractions.    History of TMJ disorder    Hx of radiation therapy 10/11/12- 11/13/12   right breast 4800 cGy 24 sessions   Hypertension    Hypothyroidism    Osteopenia 2002   Personal history of radiation therapy 2014   right br   Personal history of radiation therapy 2018   left br   Raynaud's disease    Shingles    Vasomotor rhinitis     PAST SURGICAL HISTORY: Past Surgical History:  Procedure Laterality Date   ABDOMINAL HYSTERECTOMY     BREAST BIOPSY Left 2015   MRI- Benign   BREAST BIOPSY Right 07/05/2012   Stereo- Malignant   BREAST EXCISIONAL BIOPSY Left    BREAST EXCISIONAL BIOPSY Left    BREAST LUMPECTOMY Right 08/10/12   [facility], NO RESIDUAL DISEASE   HYSTEROSCOPY WITH RESECTOSCOPE  2002   submucous fibroid   INCONTINENCE SURGERY     MINOR BREAST BIOPSY Left    times two   reverse shoulder replacement Right 03/2019   ROTATOR CUFF REPAIR Right 2010   THYROIDECTOMY      FAMILY HISTORY Family History  Problem Relation Age of Onset   Aneurysm Mother    Hypertension Sister    Cancer Sister        tongue   Clotting disorder Maternal Grandfather        liver   Cancer Maternal Grandfather        liver   Breast cancer Neg Hx   The patient's father died of pneumonia at age 29. The patient's mother died at the age of 68 from a  ruptured brain aneurysm. The patient had no brothers. The patient had 4 sisters. One sister developed tongue cancer. There is no history of breast or ovarian cancer in the family to her knowledge   GYNECOLOGIC HISTORY:  Patient's last menstrual period was 01/02/2000. Menarche age 17, first live birth age 41, the patient is [place] P2. She stopped having periods around the year 2000. She used hormone replacement approximately 13 years, until July 2014.   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 Her husband Sumi Lye owns a local [employer] franchise.  They moved to [place] in 2022.  Son Wille Glaser lives in [place], works in Insurance underwriter. Son Quay Burow is currently [employer]. Incidentally Wille Glaser had a chronic granulomatous disease and received a stem cell transplant from his brother, with excellent results.  His third child is due within the next 2 months.  The patient already has 5 grandchildren. She attends [employer]     ADVANCED DIRECTIVES:  In the absence of any documents to the contrary the patient's husband is her healthcare power of attorney   HEALTH MAINTENANCE: Social History   Tobacco Use   Smoking status: Never   Smokeless tobacco: Never  Substance Use Topics   Alcohol use: Yes    Alcohol/week: 6.0 standard drinks    Types: 6 Standard drinks or equivalent per week   Drug use: No     Colonoscopy:  PAP:  Bone density: 12/10/2014 showed a T score of -2.4   Allergies  Allergen Reactions   Pneumovax 23  [Pneumococcal Vac Polyvalent] Swelling    Swollen upper arm   Effexor [Venlafaxine] Nausea And Vomiting   Penicillins Hives   Pneumovax [Pneumococcal Polysaccharide Vaccine] Swelling    Current Outpatient Medications  Medication Sig Dispense Refill   alendronate (FOSAMAX) 70 MG tablet Take 1 tablet (70 mg total) by mouth every 7 (seven) days. Take first thing in am with 6 oz. Water.  Be upright after taking.  Eat nothing for one hour. (Patient not taking:  Reported on 06/19/2020) 12 tablet 3   cholecalciferol (VITAMIN D) 1000 UNITS tablet Take 1,000 Units by mouth 2 (two) times a week.     levothyroxine (SYNTHROID, LEVOTHROID) 75 MCG tablet Take 75 mcg by mouth. 6 days a week take 1 pill & 1 day a week take 1/2 a pill     loratadine (CLARITIN) 5 MG chewable tablet Chew 5 mg by mouth daily. Uses as needed     meloxicam (MOBIC) 15 MG tablet Take 15 mg by mouth daily.     Probiotic Product (PROBIOTIC PO) Take by mouth.     tamoxifen (NOLVADEX) 10 MG tablet TAKE 1 TABLET(10 MG) BY MOUTH DAILY 90 tablet 0   zolpidem (AMBIEN) 5 MG tablet Take 5 mg by mouth at bedtime as needed for sleep.     No current facility-administered medications for this visit.    OBJECTIVE: white woman in no acute distress  There were no vitals filed for this visit.    There is no height or weight on file to calculate BMI.   Wt Readings from Last 3 Encounters:  01/21/20 122 lb 6.4 oz (55.5 kg)  10/18/19 123 lb (55.8 kg)  06/26/19 121 lb 12.8 oz (55.2 kg)     ECOG FS:1 - Symptomatic but completely ambulatory  Sclerae unicteric, EOMs intact Wearing a mask No cervical or supraclavicular adenopathy Lungs no rales or rhonchi Heart regular rate and rhythm Abd soft, nontender, positive bowel sounds MSK no focal spinal tenderness, no upper extremity lymphedema Neuro: nonfocal, well oriented, appropriate affect Breasts: Status post bilateral lumpectomies.  There is no evidence of disease recurrence.  Both axillae are benign.   LAB RESULTS:  CMP     Component Value Date/Time   NA 141 01/03/2019 1059   NA 142 10/11/2016 1547   K 3.9 01/03/2019 1059   K 4.3 10/11/2016 1547   CL 104 01/03/2019 1059   CO2 27 01/03/2019 1059   CO2 26 10/11/2016 1547   GLUCOSE 72 01/03/2019 1059   GLUCOSE 88 10/11/2016 1547   BUN 18 01/03/2019 1059   BUN 18.5 10/11/2016 1547   CREATININE 0.71 01/03/2019 1059   CREATININE 0.7 10/11/2016 1547   CALCIUM 9.4 01/03/2019 1059   CALCIUM  9.4 10/11/2016 1547   PROT 8.2 (H) 01/03/2019 1059   PROT 7.6 10/11/2016 1547   ALBUMIN 4.0 01/03/2019 1059   ALBUMIN 3.7 10/11/2016 1547   AST 18 01/03/2019 1059  AST 19 10/11/2016 1547   ALT 18 01/03/2019 1059   ALT 12 10/11/2016 1547   ALKPHOS 116 01/03/2019 1059   ALKPHOS 103 10/11/2016 1547   BILITOT 0.3 01/03/2019 1059   BILITOT 0.23 10/11/2016 1547   GFRNONAA >60 01/03/2019 1059   GFRAA >60 01/03/2019 1059    No results found for: Ronnald Ramp, A1GS, A2GS, BETS, BETA2SER, GAMS, MSPIKE, SPEI  No results found for: Nils Pyle, [facility]  Lab Results  Component Value Date   WBC 5.8 01/03/2019   NEUTROABS 3.1 01/03/2019   HGB 13.0 01/03/2019   HCT 41.0 01/03/2019   MCV 96.5 01/03/2019   PLT 195 01/03/2019      Chemistry      Component Value Date/Time   NA 141 01/03/2019 1059   NA 142 10/11/2016 1547   K 3.9 01/03/2019 1059   K 4.3 10/11/2016 1547   CL 104 01/03/2019 1059   CO2 27 01/03/2019 1059   CO2 26 10/11/2016 1547   BUN 18 01/03/2019 1059   BUN 18.5 10/11/2016 1547   CREATININE 0.71 01/03/2019 1059   CREATININE 0.7 10/11/2016 1547      Component Value Date/Time   CALCIUM 9.4 01/03/2019 1059   CALCIUM 9.4 10/11/2016 1547   ALKPHOS 116 01/03/2019 1059   ALKPHOS 103 10/11/2016 1547   AST 18 01/03/2019 1059   AST 19 10/11/2016 1547   ALT 18 01/03/2019 1059   ALT 12 10/11/2016 1547   BILITOT 0.3 01/03/2019 1059   BILITOT 0.23 10/11/2016 1547       No results found for: LABCA2  No components found for: BZJIRC789  No results for input(s): INR in the last 168 hours.  No results found for: LABCA2  No results found for: FYB017  No results found for: PZW258  No results found for: NID782  No results found for: CA2729  No components found for: HGQUANT  No results found for: CEA1 / No results found for: CEA1   No results found for: AFPTUMOR  No results found for: CHROMOGRNA  No results found for: HGBA,  HGBA2QUANT, HGBFQUANT, HGBSQUAN (Hemoglobinopathy evaluation)   No results found for: LDH  No results found for: IRON, TIBC, IRONPCTSAT (Iron and TIBC)  No results found for: FERRITIN  Urinalysis    Component Value Date/Time   BILIRUBINUR n 04/04/2015 1330   PROTEINUR n 04/04/2015 1330   UROBILINOGEN negative 04/04/2015 1330   NITRITE n 04/04/2015 1330   LEUKOCYTESUR Negative 04/04/2015 1330    STUDIES: No results found.   ELIGIBLE FOR AVAILABLE RESEARCH PROTOCOL: no  ASSESSMENT: 76 y.o. [place] woman  RIGHT BREAST CANCER (1) status post right lumpectomy and margin clearance 08/10/2012 for ductal carcinoma in situ, grade 2 or 3, estrogen and progesterone receptor positive,  (2) adjuvant radiation to the right breast completed 11/13/2012.  LEFT BREAST CANCER (3) status post left breast biopsy 05/25/2013 for sclerosing adenosis.  (4) left breast biopsy 06/09/2016 showed a clinical T1b N0, stage IA invasive ductal carcinoma (E-cadherin positive) estrogen and progesterone receptor positive, HER-2 nonamplified, with an MIB-1 of 15%.  (5) left lumpectomy and sentinel lymph node sampling 07/12/2016 showed a pT1c pN0, stage IA invasive ductal carcinoma, grade 2, with clear margins.  (6) Oncotype DX score of 21 predicted a 10 year risk of outside the breast recurrence of 13% if the patient's only systemic therapy was tamoxifen for 5 years. It also predicted no benefit from chemotherapy  (7) adjuvant radiation completed 10/08/2016  (8) started tamoxifen 11/01/2016  (a)  tamoxifen discontinued AUG 2020 with hot flashes  (b) anastrozole TIW started August 2020, discontinued NOV 2020  (c) tamoxifen resumed NOV 2020 at 10 mg    PLAN: [name] [name] is now 4-1/2 years out from her most recent breast cancer surgery, with no evidence of disease recurrence.  This is very favorable.  She is tolerating tamoxifen well at half dose and the plan is to continue that 1 more year after which she  will be ready to "graduate".  In addition to her excellent exercise program I think she would benefit from tai chi and we discussed how she can do that  She knows to call for any other issue that may develop before her next visit here.  Total encounter time 20 minutes.Sarajane Jews C. Keera Altidor, MD  01/04/21 7:12 PM Medical Oncology and Hematology [facility] [place],  [address] Tel. [phone]    Fax. [phone]    I, Wilburn Mylar, am acting as scribe for Dr. Virgie Dad. Xion Debruyne.  I, Lurline Del MD, have reviewed the above documentation for accuracy and completeness, and I agree with the above.   *Total Encounter Time as defined by the Centers for Medicare and Medicaid Services includes, in addition to the face-to-face time of a patient visit (documented in the note above) non-face-to-face time: obtaining and reviewing outside history, ordering and reviewing medications, tests or procedures, care coordination (communications with other health care professionals or caregivers) and documentation in the medical record.

## 2021-01-05 ENCOUNTER — Other Ambulatory Visit: Payer: Self-pay

## 2021-01-05 ENCOUNTER — Inpatient Hospital Stay (HOSPITAL_BASED_OUTPATIENT_CLINIC_OR_DEPARTMENT_OTHER): Payer: Medicare Other | Admitting: Oncology

## 2021-01-05 ENCOUNTER — Inpatient Hospital Stay: Payer: Medicare Other | Attending: Oncology

## 2021-01-05 VITALS — BP 178/66 | HR 70 | Temp 97.7°F | Resp 16 | Ht 62.0 in | Wt 120.6 lb

## 2021-01-05 DIAGNOSIS — C50812 Malignant neoplasm of overlapping sites of left female breast: Secondary | ICD-10-CM

## 2021-01-05 DIAGNOSIS — C50112 Malignant neoplasm of central portion of left female breast: Secondary | ICD-10-CM | POA: Diagnosis not present

## 2021-01-05 DIAGNOSIS — Z79810 Long term (current) use of selective estrogen receptor modulators (SERMs): Secondary | ICD-10-CM | POA: Insufficient documentation

## 2021-01-05 DIAGNOSIS — D0511 Intraductal carcinoma in situ of right breast: Secondary | ICD-10-CM

## 2021-01-05 DIAGNOSIS — Z170 Estrogen receptor positive status [ER+]: Secondary | ICD-10-CM

## 2021-01-05 DIAGNOSIS — C50412 Malignant neoplasm of upper-outer quadrant of left female breast: Secondary | ICD-10-CM

## 2021-01-05 LAB — CBC WITH DIFFERENTIAL/PLATELET
Abs Immature Granulocytes: 0.02 10*3/uL (ref 0.00–0.07)
Basophils Absolute: 0 10*3/uL (ref 0.0–0.1)
Basophils Relative: 1 %
Eosinophils Absolute: 0.1 10*3/uL (ref 0.0–0.5)
Eosinophils Relative: 2 %
HCT: 37.4 % (ref 36.0–46.0)
Hemoglobin: 12 g/dL (ref 12.0–15.0)
Immature Granulocytes: 0 %
Lymphocytes Relative: 27 %
Lymphs Abs: 1.7 10*3/uL (ref 0.7–4.0)
MCH: 30.4 pg (ref 26.0–34.0)
MCHC: 32.1 g/dL (ref 30.0–36.0)
MCV: 94.7 fL (ref 80.0–100.0)
Monocytes Absolute: 0.7 10*3/uL (ref 0.1–1.0)
Monocytes Relative: 10 %
Neutro Abs: 3.8 10*3/uL (ref 1.7–7.7)
Neutrophils Relative %: 60 %
Platelets: 176 10*3/uL (ref 150–400)
RBC: 3.95 MIL/uL (ref 3.87–5.11)
RDW: 13.2 % (ref 11.5–15.5)
WBC: 6.3 10*3/uL (ref 4.0–10.5)
nRBC: 0 % (ref 0.0–0.2)

## 2021-01-05 LAB — COMPREHENSIVE METABOLIC PANEL
ALT: 17 U/L (ref 0–44)
AST: 23 U/L (ref 15–41)
Albumin: 3.8 g/dL (ref 3.5–5.0)
Alkaline Phosphatase: 74 U/L (ref 38–126)
Anion gap: 6 (ref 5–15)
BUN: 20 mg/dL (ref 8–23)
CO2: 25 mmol/L (ref 22–32)
Calcium: 8.7 mg/dL — ABNORMAL LOW (ref 8.9–10.3)
Chloride: 109 mmol/L (ref 98–111)
Creatinine, Ser: 0.73 mg/dL (ref 0.44–1.00)
GFR, Estimated: >60 mL/min (ref >60)
Glucose, Bld: 72 mg/dL (ref 70–99)
Potassium: 4.1 mmol/L (ref 3.5–5.1)
Sodium: 140 mmol/L (ref 135–145)
Total Bilirubin: 0.5 mg/dL (ref 0.3–1.2)
Total Protein: 7.7 g/dL (ref 6.5–8.1)

## 2021-01-05 MED ORDER — TAMOXIFEN CITRATE 10 MG PO TABS: ORAL_TABLET | ORAL | 4 refills | Status: DC

## 2021-01-06 DIAGNOSIS — E538 Deficiency of other specified B group vitamins: Secondary | ICD-10-CM | POA: Diagnosis not present

## 2021-01-06 DIAGNOSIS — L930 Discoid lupus erythematosus: Secondary | ICD-10-CM | POA: Diagnosis not present

## 2021-01-06 DIAGNOSIS — D0510 Intraductal carcinoma in situ of unspecified breast: Secondary | ICD-10-CM | POA: Diagnosis not present

## 2021-01-06 DIAGNOSIS — J45909 Unspecified asthma, uncomplicated: Secondary | ICD-10-CM | POA: Diagnosis not present

## 2021-01-06 DIAGNOSIS — E890 Postprocedural hypothyroidism: Secondary | ICD-10-CM | POA: Diagnosis not present

## 2021-01-06 DIAGNOSIS — D649 Anemia, unspecified: Secondary | ICD-10-CM | POA: Diagnosis not present

## 2021-01-06 DIAGNOSIS — E785 Hyperlipidemia, unspecified: Secondary | ICD-10-CM | POA: Diagnosis not present

## 2021-01-06 DIAGNOSIS — I700 Atherosclerosis of aorta: Secondary | ICD-10-CM | POA: Diagnosis not present

## 2021-01-06 DIAGNOSIS — I5189 Other ill-defined heart diseases: Secondary | ICD-10-CM | POA: Diagnosis not present

## 2021-01-06 DIAGNOSIS — I10 Essential (primary) hypertension: Secondary | ICD-10-CM | POA: Diagnosis not present

## 2021-01-06 DIAGNOSIS — M8580 Other specified disorders of bone density and structure, unspecified site: Secondary | ICD-10-CM | POA: Diagnosis not present

## 2021-01-06 DIAGNOSIS — G629 Polyneuropathy, unspecified: Secondary | ICD-10-CM | POA: Diagnosis not present

## 2021-02-05 DIAGNOSIS — Z8639 Personal history of other endocrine, nutritional and metabolic disease: Secondary | ICD-10-CM | POA: Diagnosis not present

## 2021-02-05 DIAGNOSIS — M19041 Primary osteoarthritis, right hand: Secondary | ICD-10-CM | POA: Diagnosis not present

## 2021-02-05 DIAGNOSIS — I7300 Raynaud's syndrome without gangrene: Secondary | ICD-10-CM | POA: Diagnosis not present

## 2021-02-05 DIAGNOSIS — C50911 Malignant neoplasm of unspecified site of right female breast: Secondary | ICD-10-CM | POA: Diagnosis not present

## 2021-02-05 DIAGNOSIS — E890 Postprocedural hypothyroidism: Secondary | ICD-10-CM | POA: Diagnosis not present

## 2021-02-05 DIAGNOSIS — Z170 Estrogen receptor positive status [ER+]: Secondary | ICD-10-CM | POA: Diagnosis not present

## 2021-02-05 DIAGNOSIS — M19042 Primary osteoarthritis, left hand: Secondary | ICD-10-CM | POA: Diagnosis not present

## 2021-02-05 DIAGNOSIS — F5104 Psychophysiologic insomnia: Secondary | ICD-10-CM | POA: Diagnosis not present

## 2021-02-05 DIAGNOSIS — C50912 Malignant neoplasm of unspecified site of left female breast: Secondary | ICD-10-CM | POA: Diagnosis not present

## 2021-02-05 DIAGNOSIS — Z6821 Body mass index (BMI) 21.0-21.9, adult: Secondary | ICD-10-CM | POA: Diagnosis not present

## 2021-02-05 DIAGNOSIS — M810 Age-related osteoporosis without current pathological fracture: Secondary | ICD-10-CM | POA: Diagnosis not present

## 2021-02-05 DIAGNOSIS — L930 Discoid lupus erythematosus: Secondary | ICD-10-CM | POA: Diagnosis not present

## 2021-03-19 DIAGNOSIS — I788 Other diseases of capillaries: Secondary | ICD-10-CM | POA: Diagnosis not present

## 2021-03-19 DIAGNOSIS — S91022A Laceration with foreign body, left ankle, initial encounter: Secondary | ICD-10-CM | POA: Diagnosis not present

## 2021-04-15 DIAGNOSIS — S81811A Laceration without foreign body, right lower leg, initial encounter: Secondary | ICD-10-CM | POA: Diagnosis not present

## 2021-04-15 DIAGNOSIS — L720 Epidermal cyst: Secondary | ICD-10-CM | POA: Diagnosis not present

## 2021-04-15 DIAGNOSIS — D485 Neoplasm of uncertain behavior of skin: Secondary | ICD-10-CM | POA: Diagnosis not present

## 2021-05-20 DIAGNOSIS — L814 Other melanin hyperpigmentation: Secondary | ICD-10-CM | POA: Diagnosis not present

## 2021-05-20 DIAGNOSIS — D2262 Melanocytic nevi of left upper limb, including shoulder: Secondary | ICD-10-CM | POA: Diagnosis not present

## 2021-05-20 DIAGNOSIS — L821 Other seborrheic keratosis: Secondary | ICD-10-CM | POA: Diagnosis not present

## 2021-05-20 DIAGNOSIS — Z1283 Encounter for screening for malignant neoplasm of skin: Secondary | ICD-10-CM | POA: Diagnosis not present

## 2021-05-20 DIAGNOSIS — Z7189 Other specified counseling: Secondary | ICD-10-CM | POA: Diagnosis not present

## 2021-05-20 DIAGNOSIS — L930 Discoid lupus erythematosus: Secondary | ICD-10-CM | POA: Diagnosis not present

## 2021-05-20 DIAGNOSIS — D225 Melanocytic nevi of trunk: Secondary | ICD-10-CM | POA: Diagnosis not present

## 2021-05-20 DIAGNOSIS — L905 Scar conditions and fibrosis of skin: Secondary | ICD-10-CM | POA: Diagnosis not present

## 2021-05-20 DIAGNOSIS — D2261 Melanocytic nevi of right upper limb, including shoulder: Secondary | ICD-10-CM | POA: Diagnosis not present

## 2021-05-29 ENCOUNTER — Telehealth: Payer: Self-pay | Admitting: Hematology and Oncology

## 2021-05-29 NOTE — Telephone Encounter (Signed)
Rescheduled appointment per providers template. Left message.  ? ?

## 2021-06-03 ENCOUNTER — Ambulatory Visit: Payer: Medicare Other | Admitting: Hematology and Oncology

## 2021-06-04 ENCOUNTER — Ambulatory Visit: Payer: Medicare Other | Admitting: Hematology and Oncology

## 2021-06-16 ENCOUNTER — Other Ambulatory Visit: Payer: Self-pay | Admitting: Obstetrics and Gynecology

## 2021-06-16 ENCOUNTER — Other Ambulatory Visit: Payer: Self-pay | Admitting: Endocrinology

## 2021-06-16 ENCOUNTER — Encounter: Payer: Self-pay | Admitting: Obstetrics and Gynecology

## 2021-06-16 DIAGNOSIS — Z9889 Other specified postprocedural states: Secondary | ICD-10-CM

## 2021-06-16 NOTE — Telephone Encounter (Signed)
The radiologist will likely want this scheduled as diagnostic mammo with poss breast u/s due to the pain.  Do you want to examine her prior to ordering? ? ?The schedules there have been running weeks ahead. Not sure we will be able to get her in earlier.  ?

## 2021-06-17 NOTE — Telephone Encounter (Signed)
Dr. Talbert Nan- ?How to schedule?  As Diagnostic or screening as she requested? ?

## 2021-06-17 NOTE — Telephone Encounter (Signed)
Go ahead and schedule her please. ?

## 2021-06-18 ENCOUNTER — Other Ambulatory Visit: Payer: Self-pay

## 2021-06-18 DIAGNOSIS — N644 Mastodynia: Secondary | ICD-10-CM

## 2021-06-18 NOTE — Telephone Encounter (Signed)
Diagnostic on the right please

## 2021-06-22 DIAGNOSIS — Z0000 Encounter for general adult medical examination without abnormal findings: Secondary | ICD-10-CM | POA: Diagnosis not present

## 2021-06-23 DIAGNOSIS — I10 Essential (primary) hypertension: Secondary | ICD-10-CM | POA: Diagnosis not present

## 2021-06-23 DIAGNOSIS — E890 Postprocedural hypothyroidism: Secondary | ICD-10-CM | POA: Diagnosis not present

## 2021-06-23 DIAGNOSIS — M109 Gout, unspecified: Secondary | ICD-10-CM | POA: Diagnosis not present

## 2021-06-23 DIAGNOSIS — M859 Disorder of bone density and structure, unspecified: Secondary | ICD-10-CM | POA: Diagnosis not present

## 2021-06-23 DIAGNOSIS — H04123 Dry eye syndrome of bilateral lacrimal glands: Secondary | ICD-10-CM | POA: Diagnosis not present

## 2021-06-23 DIAGNOSIS — E785 Hyperlipidemia, unspecified: Secondary | ICD-10-CM | POA: Diagnosis not present

## 2021-06-24 DIAGNOSIS — Z96611 Presence of right artificial shoulder joint: Secondary | ICD-10-CM | POA: Diagnosis not present

## 2021-06-30 ENCOUNTER — Other Ambulatory Visit: Payer: Self-pay | Admitting: Obstetrics and Gynecology

## 2021-06-30 ENCOUNTER — Ambulatory Visit: Payer: Medicare Other

## 2021-06-30 ENCOUNTER — Ambulatory Visit
Admission: RE | Admit: 2021-06-30 | Discharge: 2021-06-30 | Disposition: A | Discharge status: Home / Self Care | Payer: Medicare Other | Source: Ambulatory Visit | Attending: Obstetrics and Gynecology | Admitting: Obstetrics and Gynecology

## 2021-06-30 DIAGNOSIS — I10 Essential (primary) hypertension: Secondary | ICD-10-CM | POA: Diagnosis not present

## 2021-06-30 DIAGNOSIS — G629 Polyneuropathy, unspecified: Secondary | ICD-10-CM | POA: Diagnosis not present

## 2021-06-30 DIAGNOSIS — R82998 Other abnormal findings in urine: Secondary | ICD-10-CM | POA: Diagnosis not present

## 2021-06-30 DIAGNOSIS — J45909 Unspecified asthma, uncomplicated: Secondary | ICD-10-CM | POA: Diagnosis not present

## 2021-06-30 DIAGNOSIS — L930 Discoid lupus erythematosus: Secondary | ICD-10-CM | POA: Diagnosis not present

## 2021-06-30 DIAGNOSIS — N644 Mastodynia: Secondary | ICD-10-CM

## 2021-06-30 DIAGNOSIS — Z0000 Encounter for general adult medical examination without abnormal findings: Secondary | ICD-10-CM | POA: Diagnosis not present

## 2021-06-30 DIAGNOSIS — E785 Hyperlipidemia, unspecified: Secondary | ICD-10-CM | POA: Diagnosis not present

## 2021-06-30 DIAGNOSIS — E890 Postprocedural hypothyroidism: Secondary | ICD-10-CM | POA: Diagnosis not present

## 2021-06-30 DIAGNOSIS — M8580 Other specified disorders of bone density and structure, unspecified site: Secondary | ICD-10-CM | POA: Diagnosis not present

## 2021-06-30 DIAGNOSIS — Z853 Personal history of malignant neoplasm of breast: Secondary | ICD-10-CM | POA: Diagnosis not present

## 2021-06-30 DIAGNOSIS — R7989 Other specified abnormal findings of blood chemistry: Secondary | ICD-10-CM | POA: Diagnosis not present

## 2021-06-30 DIAGNOSIS — I700 Atherosclerosis of aorta: Secondary | ICD-10-CM | POA: Diagnosis not present

## 2021-06-30 DIAGNOSIS — Z1339 Encounter for screening examination for other mental health and behavioral disorders: Secondary | ICD-10-CM | POA: Diagnosis not present

## 2021-06-30 DIAGNOSIS — Z1331 Encounter for screening for depression: Secondary | ICD-10-CM | POA: Diagnosis not present

## 2021-07-09 DIAGNOSIS — R21 Rash and other nonspecific skin eruption: Secondary | ICD-10-CM | POA: Diagnosis not present

## 2021-07-09 DIAGNOSIS — L905 Scar conditions and fibrosis of skin: Secondary | ICD-10-CM | POA: Diagnosis not present

## 2021-07-09 DIAGNOSIS — L930 Discoid lupus erythematosus: Secondary | ICD-10-CM | POA: Diagnosis not present

## 2021-07-23 DIAGNOSIS — R21 Rash and other nonspecific skin eruption: Secondary | ICD-10-CM | POA: Diagnosis not present

## 2021-07-23 DIAGNOSIS — L0102 Bockhart's impetigo: Secondary | ICD-10-CM | POA: Diagnosis not present

## 2021-07-23 DIAGNOSIS — D485 Neoplasm of uncertain behavior of skin: Secondary | ICD-10-CM | POA: Diagnosis not present

## 2021-07-30 DIAGNOSIS — L718 Other rosacea: Secondary | ICD-10-CM | POA: Diagnosis not present

## 2021-08-10 DIAGNOSIS — H01001 Unspecified blepharitis right upper eyelid: Secondary | ICD-10-CM | POA: Diagnosis not present

## 2021-08-10 DIAGNOSIS — Z419 Encounter for procedure for purposes other than remedying health state, unspecified: Secondary | ICD-10-CM | POA: Diagnosis not present

## 2021-08-10 DIAGNOSIS — L309 Dermatitis, unspecified: Secondary | ICD-10-CM | POA: Diagnosis not present

## 2021-09-16 DIAGNOSIS — D485 Neoplasm of uncertain behavior of skin: Secondary | ICD-10-CM | POA: Diagnosis not present

## 2021-09-16 DIAGNOSIS — L739 Follicular disorder, unspecified: Secondary | ICD-10-CM | POA: Diagnosis not present

## 2021-10-20 ENCOUNTER — Emergency Department (HOSPITAL_BASED_OUTPATIENT_CLINIC_OR_DEPARTMENT_OTHER): Payer: Medicare Other | Admitting: Radiology

## 2021-10-20 ENCOUNTER — Encounter (HOSPITAL_BASED_OUTPATIENT_CLINIC_OR_DEPARTMENT_OTHER): Payer: Self-pay

## 2021-10-20 ENCOUNTER — Emergency Department (HOSPITAL_BASED_OUTPATIENT_CLINIC_OR_DEPARTMENT_OTHER)
Admission: EM | Admit: 2021-10-20 | Discharge: 2021-10-20 | Disposition: A | Discharge status: Home / Self Care | Payer: Medicare Other | Attending: Emergency Medicine | Admitting: Emergency Medicine

## 2021-10-20 ENCOUNTER — Emergency Department (HOSPITAL_BASED_OUTPATIENT_CLINIC_OR_DEPARTMENT_OTHER): Payer: Medicare Other

## 2021-10-20 ENCOUNTER — Other Ambulatory Visit: Payer: Self-pay

## 2021-10-20 ENCOUNTER — Other Ambulatory Visit (HOSPITAL_BASED_OUTPATIENT_CLINIC_OR_DEPARTMENT_OTHER): Payer: Self-pay

## 2021-10-20 DIAGNOSIS — G4489 Other headache syndrome: Secondary | ICD-10-CM | POA: Diagnosis not present

## 2021-10-20 DIAGNOSIS — Z23 Encounter for immunization: Secondary | ICD-10-CM | POA: Diagnosis not present

## 2021-10-20 DIAGNOSIS — Y9289 Other specified places as the place of occurrence of the external cause: Secondary | ICD-10-CM | POA: Insufficient documentation

## 2021-10-20 DIAGNOSIS — S098XXA Other specified injuries of head, initial encounter: Secondary | ICD-10-CM | POA: Diagnosis not present

## 2021-10-20 DIAGNOSIS — W010XXA Fall on same level from slipping, tripping and stumbling without subsequent striking against object, initial encounter: Secondary | ICD-10-CM | POA: Insufficient documentation

## 2021-10-20 DIAGNOSIS — S0080XA Unspecified superficial injury of other part of head, initial encounter: Secondary | ICD-10-CM | POA: Diagnosis present

## 2021-10-20 DIAGNOSIS — S0993XA Unspecified injury of face, initial encounter: Secondary | ICD-10-CM | POA: Diagnosis not present

## 2021-10-20 DIAGNOSIS — S0081XA Abrasion of other part of head, initial encounter: Secondary | ICD-10-CM | POA: Diagnosis not present

## 2021-10-20 DIAGNOSIS — S199XXA Unspecified injury of neck, initial encounter: Secondary | ICD-10-CM | POA: Diagnosis not present

## 2021-10-20 DIAGNOSIS — S0083XA Contusion of other part of head, initial encounter: Secondary | ICD-10-CM | POA: Diagnosis not present

## 2021-10-20 DIAGNOSIS — T148XXA Other injury of unspecified body region, initial encounter: Secondary | ICD-10-CM

## 2021-10-20 DIAGNOSIS — M25532 Pain in left wrist: Secondary | ICD-10-CM | POA: Diagnosis not present

## 2021-10-20 DIAGNOSIS — W19XXXA Unspecified fall, initial encounter: Secondary | ICD-10-CM | POA: Diagnosis not present

## 2021-10-20 DIAGNOSIS — I10 Essential (primary) hypertension: Secondary | ICD-10-CM | POA: Diagnosis not present

## 2021-10-20 MED ORDER — TETANUS-DIPHTH-ACELL PERTUSSIS 5-2.5-18.5 LF-MCG/0.5 IM SUSY
0.5000 mL | PREFILLED_SYRINGE | Freq: Once | INTRAMUSCULAR | Status: AC
  Administered 2021-10-20: 0.5 mL via INTRAMUSCULAR
  Filled 2021-10-20: qty 0.5

## 2021-10-20 NOTE — ED Notes (Signed)
Patient transported to CT 

## 2021-10-20 NOTE — ED Provider Notes (Signed)
[facility] EMERGENCY DEPT Provider Note   CSN: [id number] Arrival date & time: 10/20/21  9767     History  Chief Complaint  Patient presents with   [name] [name] is a 76 y.o. female.  Patient here after mechanical fall at home.  Tripped over a cord and hit the front of her head.  Hematoma to left forehead.  Small laceration over the bridge of nose.  No bloody nose.  Not on blood thinners.  No other major medical problems.  Nothing makes it worse or better.  No extremity pain.  Was ambulatory after the fall.  Not having any pain in her arms or legs.  No neck pain, no loss of consciousness.  The history is provided by the patient.       Home Medications Prior to Admission medications   Medication Sig Start Date End Date Taking? Authorizing Provider  cholecalciferol (VITAMIN D) 1000 UNITS tablet Take 1,000 Units by mouth 2 (two) times a week.    [provider]  levothyroxine (SYNTHROID, LEVOTHROID) 75 MCG tablet Take 75 mcg by mouth. 6 days a week take 1 pill & 1 day a week take 1/2 a pill    [provider]  loratadine (CLARITIN) 5 MG chewable tablet Chew 5 mg by mouth daily. Uses as needed    [provider]  meloxicam (MOBIC) 15 MG tablet Take 15 mg by mouth daily. 09/27/19   [provider]  Probiotic Product (PROBIOTIC PO) Take by mouth.    [provider]  tamoxifen (NOLVADEX) 10 MG tablet TAKE 1 TABLET(10 MG) BY MOUTH DAILY 01/05/21   Magrinat, Virgie Dad, MD  zolpidem (AMBIEN) 5 MG tablet Take 5 mg by mouth at bedtime as needed for sleep.    [provider]      Allergies    Pneumovax 23  [pneumococcal vac polyvalent], Effexor [venlafaxine], Penicillins, and Pneumovax [pneumococcal polysaccharide vaccine]    Review of Systems   Review of Systems  Physical Exam Updated Vital Signs BP (!) 195/70 (BP Location: Right Arm)   Pulse 60   Temp 98.9 F (37.2 C) (Oral)   Resp 18   LMP 01/02/2000    SpO2 100%  Physical Exam Vitals and nursing note reviewed.  Constitutional:      General: She is not in acute distress.    Appearance: She is well-developed.  HENT:     Head:     Comments: Large left forehead hematoma, less than a centimeter superficial laceration over the bridge of the nose that is hemostatic Eyes:     Extraocular Movements: Extraocular movements intact.     Conjunctiva/sclera: Conjunctivae normal.     Pupils: Pupils are equal, round, and reactive to light.  Cardiovascular:     Rate and Rhythm: Normal rate and regular rhythm.     Heart sounds: No murmur heard. Pulmonary:     Effort: Pulmonary effort is normal. No respiratory distress.     Breath sounds: Normal breath sounds.  Abdominal:     Palpations: Abdomen is soft.     Tenderness: There is no abdominal tenderness.  Musculoskeletal:        General: No swelling or tenderness.     Cervical back: Normal range of motion and neck supple. No tenderness.  Skin:    General: Skin is warm and dry.     Capillary Refill: Capillary refill takes less than 2 seconds.  Neurological:     General: No focal  deficit present.     Mental Status: She is alert and oriented to person, place, and time.     Cranial Nerves: No cranial nerve deficit.     Sensory: No sensory deficit.     Motor: No weakness.     Coordination: Coordination normal.  Psychiatric:        Mood and Affect: Mood normal.     ED Results / Procedures / Treatments   Labs (all labs ordered are listed, but only abnormal results are displayed) Labs Reviewed - No data to display  EKG None  Radiology CT Cervical Spine Wo Contrast  Result Date: 10/20/2021 CLINICAL DATA:  Neck trauma (Age >= 65y).  Fall. EXAM: CT CERVICAL SPINE WITHOUT CONTRAST TECHNIQUE: Multidetector CT imaging of the cervical spine was performed without intravenous contrast. Multiplanar CT image reconstructions were also generated. RADIATION DOSE REDUCTION: This exam was performed  according to the departmental dose-optimization program which includes automated exposure control, adjustment of the mA and/or kV according to patient size and/or use of iterative reconstruction technique. COMPARISON:  None Available. FINDINGS: Alignment: Slight degenerative anterolisthesis of C4 on C5 of 2 mm related to facet disease. Skull base and vertebrae: No acute fracture. No primary bone lesion or focal pathologic process. Soft tissues and spinal canal: No prevertebral fluid or swelling. No visible canal hematoma. Disc levels: Degenerative disc disease at C5-6 and C6-7. Bilateral degenerative facet disease, right greater than left. Upper chest: No acute findings Other: None IMPRESSION: Degenerative changes.  No acute bony abnormality. Electronically Signed   By: Rolm Baptise M.D.   On: 10/20/2021 10:41   CT Head Wo Contrast  Result Date: 10/20/2021 CLINICAL DATA:  Head trauma, moderate-severe.  Fall. EXAM: CT HEAD WITHOUT CONTRAST TECHNIQUE: Contiguous axial images were obtained from the base of the skull through the vertex without intravenous contrast. RADIATION DOSE REDUCTION: This exam was performed according to the departmental dose-optimization program which includes automated exposure control, adjustment of the mA and/or kV according to patient size and/or use of iterative reconstruction technique. COMPARISON:  None Available. FINDINGS: Brain: Mild age related volume loss. No acute intracranial abnormality. Specifically, no hemorrhage, hydrocephalus, mass lesion, acute infarction, or significant intracranial injury. Vascular: No hyperdense vessel or unexpected calcification. Skull: No acute calvarial abnormality. Sinuses/Orbits: No acute findings Other: Soft tissue swelling over the left forehead. IMPRESSION: No acute intracranial abnormality. Electronically Signed   By: Rolm Baptise M.D.   On: 10/20/2021 10:40   CT Maxillofacial Wo Contrast  Result Date: 10/20/2021 CLINICAL DATA:  Facial  trauma, blunt.  Fall EXAM: CT MAXILLOFACIAL WITHOUT CONTRAST TECHNIQUE: Multidetector CT imaging of the maxillofacial structures was performed. Multiplanar CT image reconstructions were also generated. RADIATION DOSE REDUCTION: This exam was performed according to the departmental dose-optimization program which includes automated exposure control, adjustment of the mA and/or kV according to patient size and/or use of iterative reconstruction technique. COMPARISON:  None Available. FINDINGS: Osseous: No fracture or mandibular dislocation. No destructive process. Orbits: Negative. No traumatic or inflammatory finding. Sinuses: No air-fluid levels. Mucosal thickening in the maxillary sinuses. Soft tissues: Soft tissue swelling over the left forehead. Limited intracranial: See head CT report IMPRESSION: No facial or orbital fracture. Electronically Signed   By: Rolm Baptise M.D.   On: 10/20/2021 10:39   DG Wrist Complete Left  Result Date: 10/20/2021 CLINICAL DATA:  Fall, left wrist pain EXAM: LEFT WRIST - COMPLETE 3+ VIEW COMPARISON:  None Available. FINDINGS: No acute bony abnormality. Specifically, no fracture, subluxation, or dislocation.  Mild degenerative changes in the left wrist. Cystic change in the lunate. Soft tissues unremarkable. IMPRESSION: No acute bony abnormality. Electronically Signed   By: Rolm Baptise M.D.   On: 10/20/2021 10:15    Procedures Procedures    Medications Ordered in ED Medications  Tdap (BOOSTRIX) injection 0.5 mL (0.5 mLs Intramuscular Given 10/20/21 1036)    ED Course/ Medical Decision Making/ A&P                           Medical Decision Making Amount and/or Complexity of Data Reviewed Radiology: ordered.  Risk Prescription drug management.   Chapman Moss is here after mechanical fall.  Large hematoma over the left forehead.  Small superficial abrasion/laceration to the digit and nose that is hemostatic and well approximated.  We will get CT scan of the  head, neck, face.  She has no midline spinal tenderness.  No other extremity tenderness.  Well-appearing.  Not on blood thinners.  Does not take an aspirin.  Will rule out traumatic injuries  Per my review of radiology report there is no acute injuries.  X-ray of the left wrist per my review and interpretation shows no fracture or malalignment.  Overall suspect bone bruise.  Recommend Tylenol, ice, ibuprofen, rest.  Discharged in good condition.  Tetanus shot updated.  This chart was dictated using voice recognition software.  Despite best efforts to proofread,  errors can occur which can change the documentation meaning.         Final Clinical Impression(s) / ED Diagnoses Final diagnoses:  Traumatic hematoma of forehead, initial encounter  Abrasion    Rx / DC Orders ED Discharge Orders     None         Lennice Sites, DO 10/20/21 1046

## 2021-10-20 NOTE — Discharge Instructions (Signed)
CT imaging shows no injuries.  X-ray of her forearm is normal.  Overall suspect hematoma/bruise of your forehead.  This should resolve with time.  Recommend ice, Tylenol, ibuprofen.  I would hold off on aggressively scrubbing at your nose.  Wash gently around this area and you can remove Steri-Strips in about 5 to 7 days.

## 2021-10-20 NOTE — ED Notes (Signed)
Steri strip placed by EDP [place].   Pt also c/o pain to her Left wrist area. Bruising noted. Ice pack given.

## 2021-10-20 NOTE — ED Triage Notes (Signed)
Pt BIB [facility] from [facility] had a mechanical fall from a standing position. Pt got her foot caught up in her hairdryer cord fell onto her Left side. Pt denies LOC, N/V or back pain.  Pt presents with a hematoma to Left forehead above her eye, small lac to bridge of her nose, bleeding controlled, no blood thinners.  Pt ac/ox4, ambulatory on arrival from EMS stretcher to ED bed.   BP 162/100 HR 70 RR 18 99% RA  CBG 106

## 2021-10-22 DIAGNOSIS — M25532 Pain in left wrist: Secondary | ICD-10-CM | POA: Diagnosis not present

## 2021-10-22 DIAGNOSIS — M25522 Pain in left elbow: Secondary | ICD-10-CM | POA: Diagnosis not present

## 2021-10-29 ENCOUNTER — Telehealth: Payer: Self-pay

## 2021-10-29 NOTE — Telephone Encounter (Signed)
     Patient  visit on 9/18  at [facility]  Have you been able to follow up with your primary care physician? Yes   The patient was or was not able to obtain any needed medicine or equipment.Yes  Are there diet recommendations that you are having difficulty following?na  Patient expresses understanding of discharge instructions and education provided has no other needs at this time. Yes     [facility], [facility], Care Management  [phone] [address], [place], [place] [address] Phone: [phone] Email: [email or web address]

## 2021-11-02 DIAGNOSIS — M25532 Pain in left wrist: Secondary | ICD-10-CM | POA: Diagnosis not present

## 2021-11-02 DIAGNOSIS — M25522 Pain in left elbow: Secondary | ICD-10-CM | POA: Diagnosis not present

## 2021-11-02 DIAGNOSIS — S52135A Nondisplaced fracture of neck of left radius, initial encounter for closed fracture: Secondary | ICD-10-CM | POA: Diagnosis not present

## 2021-11-03 DIAGNOSIS — S52132A Displaced fracture of neck of left radius, initial encounter for closed fracture: Secondary | ICD-10-CM | POA: Insufficient documentation

## 2021-11-03 DIAGNOSIS — L718 Other rosacea: Secondary | ICD-10-CM | POA: Diagnosis not present

## 2021-11-03 DIAGNOSIS — L738 Other specified follicular disorders: Secondary | ICD-10-CM | POA: Diagnosis not present

## 2021-11-12 ENCOUNTER — Ambulatory Visit: Payer: Medicare Other | Admitting: Obstetrics and Gynecology

## 2021-11-17 DIAGNOSIS — Z23 Encounter for immunization: Secondary | ICD-10-CM | POA: Diagnosis not present

## 2021-11-23 ENCOUNTER — Ambulatory Visit: Payer: Medicare Other | Admitting: Obstetrics and Gynecology

## 2021-11-23 DIAGNOSIS — M25522 Pain in left elbow: Secondary | ICD-10-CM | POA: Diagnosis not present

## 2021-11-23 DIAGNOSIS — S52135A Nondisplaced fracture of neck of left radius, initial encounter for closed fracture: Secondary | ICD-10-CM | POA: Diagnosis not present

## 2021-12-14 ENCOUNTER — Telehealth: Payer: Self-pay | Admitting: Hematology and Oncology

## 2021-12-17 NOTE — Progress Notes (Signed)
76 y.o. G2P2 Married White or Caucasian Not Hispanic or Latino female here for annual exam.  H/O hysterectomy (has her ovaries).   H/O bilateral breast cancer, she stopped taking her tamoxifen two weeks ago. She has an appointment with the Oncologist tomorrow and will discuss the tamoxifen further.    H/O osteoporosis (based on an elevated FRAX), she was on fosamax for ~6 weeks in 2022, stopped it secondary to hot flashes. She was going to f/u with Dr Forde Dandy to further discuss treatment. He felt she was covered with the Tamoxifen.   H/O hsv, no outbreaks, doesn't need a refill on valtrex.  She is taking gabapentin 200 mg po qhs for sleep and nerve pain (residual from shoulder surgery).   Patient's last menstrual period was 01/02/2000.          Sexually active: No.  The current method of family planning is status post hysterectomy.    Exercising: Yes.     Walking, trainer, golfing  Smoker:  no  Health Maintenance: Pap:  06-23-17 neg, 04-04-15 neg   History of abnormal Pap:  no MMG:  06/30/21 Bi-rads 2 benign BMD:   01/16/20 osteopenic, T score of -2.1. FRAX 19.2/5.1%  Colonoscopy:  2004, cologard 2017 neg per patient, she did another cologuard in 6/21 with her primary and reports that it was negative.   TDaP:  9/23 Gardasil: n/a   reports that she has never smoked. She has never used smokeless tobacco. She reports current alcohol use of about 6.0 standard drinks of alcohol per week. She reports that she does not use drugs. Husband has Parkinson's, doing well. They are living at [place], but travel a lot.  She has 2 kids and 6 grandchildren.   Past Medical History:  Diagnosis Date   Acute asthmatic bronchitis    Asthma, mild intermittent    Breast cancer ([facility]) 08/2012   right-DCIS, +ER/PR   Cystocele    Grade 2   Diverticulosis 2004   Fibroids 1998   Fracture of fibula 2007   left;  hit with golf ball   Heart murmur    nl echo   History of radiation therapy 09/16/2016- 10/08/2016    Left Breast. 42.56 Gy in 16 fractions.    History of TMJ disorder    Hx of radiation therapy 10/11/12- 11/13/12   right breast 4800 cGy 24 sessions   Hypertension    Hypothyroidism    Osteopenia 2002   Personal history of radiation therapy 2014   right br   Personal history of radiation therapy 2018   left br   Raynaud's disease    Shingles    Vasomotor rhinitis     Past Surgical History:  Procedure Laterality Date   ABDOMINAL HYSTERECTOMY     BREAST BIOPSY Left 2015   MRI- Benign   BREAST BIOPSY Right 07/05/2012   Stereo- Malignant   BREAST EXCISIONAL BIOPSY Left    BREAST EXCISIONAL BIOPSY Left    BREAST LUMPECTOMY Right 08/10/12   [facility], NO RESIDUAL DISEASE   HYSTEROSCOPY WITH RESECTOSCOPE  2002   submucous fibroid   INCONTINENCE SURGERY     MINOR BREAST BIOPSY Left    times two   reverse shoulder replacement Right 03/2019   ROTATOR CUFF REPAIR Right 2010   THYROIDECTOMY      Current Outpatient Medications  Medication Sig Dispense Refill   cholecalciferol (VITAMIN D) 1000 UNITS tablet Take 1,000 Units by mouth 2 (two) times a week.     levothyroxine (SYNTHROID,  LEVOTHROID) 75 MCG tablet Take 75 mcg by mouth. 6 days a week take 1 pill & 1 day a week take 1/2 a pill     meloxicam (MOBIC) 15 MG tablet Take 15 mg by mouth daily.     Probiotic Product (PROBIOTIC PO) Take by mouth.     zolpidem (AMBIEN) 5 MG tablet Take 5 mg by mouth at bedtime as needed for sleep.     loratadine (CLARITIN) 5 MG chewable tablet Chew 5 mg by mouth daily. Uses as needed (Patient not taking: Reported on 12/29/2021)     tamoxifen (NOLVADEX) 10 MG tablet TAKE 1 TABLET(10 MG) BY MOUTH DAILY (Patient not taking: Reported on 12/29/2021) 90 tablet 4   No current facility-administered medications for this visit.    Family History  Problem Relation Age of Onset   Aneurysm Mother    Hypertension Sister    Cancer Sister        tongue   Clotting disorder Maternal Grandfather        liver    Cancer Maternal Grandfather        liver   Breast cancer Neg Hx     Review of Systems  All other systems reviewed and are negative.   Exam:   BP 126/70   Pulse 88   Ht '5\' 1"'$  (1.549 m)   Wt 121 lb (54.9 kg)   LMP 01/02/2000   SpO2 99%   BMI 22.86 kg/m   Weight change: '@WEIGHTCHANGE'$ @ Height:   Height: '5\' 1"'$  (154.9 cm)  Ht Readings from Last 3 Encounters:  12/29/21 '5\' 1"'$  (1.549 m)  01/05/21 '5\' 2"'$  (1.575 m)  01/21/20 '5\' 2"'$  (1.575 m)    General appearance: alert, cooperative and appears stated age Head: Normocephalic, without obvious abnormality, atraumatic Neck: no adenopathy, supple, symmetrical, trachea midline and thyroid normal to inspection and palpation Lungs: clear to auscultation bilaterally Cardiovascular: regular rate and rhythm Breasts: normal appearance, no masses or tenderness Abdomen: soft, non-tender; non distended,  no masses,  no organomegaly Extremities: extremities normal, atraumatic, no cyanosis or edema Skin: Skin color, texture, turgor normal. No rashes or lesions Lymph nodes: Cervical, supraclavicular, and axillary nodes normal. No abnormal inguinal nodes palpated Neurologic: Grossly normal   Pelvic: External genitalia:  no lesions              Urethra:  normal appearing urethra with no masses, tenderness or lesions              Bartholins and Skenes: normal                 Vagina: atrophic appearing vagina with normal color and discharge, no lesions              Cervix: absent               Bimanual Exam:  Uterus:  uterus absent              Adnexa: no mass, fullness, tenderness               Rectovaginal: Confirms               Anus:  normal sphincter tone, no lesions  Gae Dry chaperoned for the exam.  1. Encounter for breast and pelvic examination Discussed breast self exam Labs with primary Mammogram UTD  2. Age-related osteoporosis without current pathological fracture - DG Bone Density; Future Discussed calcium and vit D  intake Discussed weight bearing exercise, working on balance Avoid  falls  3. Hypoestrogenism - DG Bone Density; Future  4. History of breast cancer F/U with Oncology tomorrow

## 2021-12-21 ENCOUNTER — Ambulatory Visit: Payer: Medicare Other | Admitting: Hematology and Oncology

## 2021-12-21 ENCOUNTER — Other Ambulatory Visit: Payer: Medicare Other

## 2021-12-29 ENCOUNTER — Other Ambulatory Visit: Payer: Self-pay | Admitting: *Deleted

## 2021-12-29 ENCOUNTER — Ambulatory Visit (INDEPENDENT_AMBULATORY_CARE_PROVIDER_SITE_OTHER): Payer: Medicare Other | Admitting: Obstetrics and Gynecology

## 2021-12-29 ENCOUNTER — Encounter: Payer: Self-pay | Admitting: Obstetrics and Gynecology

## 2021-12-29 VITALS — BP 126/70 | HR 88 | Ht 61.0 in | Wt 121.0 lb

## 2021-12-29 DIAGNOSIS — Z9189 Other specified personal risk factors, not elsewhere classified: Secondary | ICD-10-CM | POA: Diagnosis not present

## 2021-12-29 DIAGNOSIS — M810 Age-related osteoporosis without current pathological fracture: Secondary | ICD-10-CM

## 2021-12-29 DIAGNOSIS — D2262 Melanocytic nevi of left upper limb, including shoulder: Secondary | ICD-10-CM | POA: Diagnosis not present

## 2021-12-29 DIAGNOSIS — L930 Discoid lupus erythematosus: Secondary | ICD-10-CM | POA: Diagnosis not present

## 2021-12-29 DIAGNOSIS — Z853 Personal history of malignant neoplasm of breast: Secondary | ICD-10-CM

## 2021-12-29 DIAGNOSIS — D1801 Hemangioma of skin and subcutaneous tissue: Secondary | ICD-10-CM | POA: Diagnosis not present

## 2021-12-29 DIAGNOSIS — Z01419 Encounter for gynecological examination (general) (routine) without abnormal findings: Secondary | ICD-10-CM

## 2021-12-29 DIAGNOSIS — E2839 Other primary ovarian failure: Secondary | ICD-10-CM | POA: Diagnosis not present

## 2021-12-29 DIAGNOSIS — D2261 Melanocytic nevi of right upper limb, including shoulder: Secondary | ICD-10-CM | POA: Diagnosis not present

## 2021-12-29 DIAGNOSIS — L821 Other seborrheic keratosis: Secondary | ICD-10-CM | POA: Diagnosis not present

## 2021-12-29 DIAGNOSIS — Z170 Estrogen receptor positive status [ER+]: Secondary | ICD-10-CM

## 2021-12-29 DIAGNOSIS — L738 Other specified follicular disorders: Secondary | ICD-10-CM | POA: Diagnosis not present

## 2021-12-30 ENCOUNTER — Other Ambulatory Visit: Payer: Self-pay

## 2021-12-30 ENCOUNTER — Inpatient Hospital Stay (HOSPITAL_BASED_OUTPATIENT_CLINIC_OR_DEPARTMENT_OTHER): Payer: Medicare Other | Admitting: Hematology and Oncology

## 2021-12-30 ENCOUNTER — Inpatient Hospital Stay: Payer: Medicare Other | Attending: Hematology and Oncology

## 2021-12-30 VITALS — BP 119/105 | HR 72 | Temp 98.1°F | Resp 16 | Ht 61.0 in | Wt 121.8 lb

## 2021-12-30 DIAGNOSIS — Z170 Estrogen receptor positive status [ER+]: Secondary | ICD-10-CM | POA: Diagnosis not present

## 2021-12-30 DIAGNOSIS — C50812 Malignant neoplasm of overlapping sites of left female breast: Secondary | ICD-10-CM

## 2021-12-30 DIAGNOSIS — Z853 Personal history of malignant neoplasm of breast: Secondary | ICD-10-CM | POA: Diagnosis not present

## 2021-12-30 DIAGNOSIS — Z08 Encounter for follow-up examination after completed treatment for malignant neoplasm: Secondary | ICD-10-CM | POA: Diagnosis not present

## 2021-12-30 LAB — CMP (CANCER CENTER ONLY)
ALT: 12 U/L (ref 0–44)
AST: 16 U/L (ref 15–41)
Albumin: 4.2 g/dL (ref 3.5–5.0)
Alkaline Phosphatase: 85 U/L (ref 38–126)
Anion gap: 6 (ref 5–15)
BUN: 23 mg/dL (ref 8–23)
CO2: 26 mmol/L (ref 22–32)
Calcium: 9.2 mg/dL (ref 8.9–10.3)
Chloride: 107 mmol/L (ref 98–111)
Creatinine: 0.67 mg/dL (ref 0.44–1.00)
GFR, Estimated: >60 mL/min (ref >60)
Glucose, Bld: 123 mg/dL — ABNORMAL HIGH (ref 70–99)
Potassium: 3.6 mmol/L (ref 3.5–5.1)
Sodium: 139 mmol/L (ref 135–145)
Total Bilirubin: 0.3 mg/dL (ref 0.3–1.2)
Total Protein: 7.8 g/dL (ref 6.5–8.1)

## 2021-12-30 LAB — CBC WITH DIFFERENTIAL (CANCER CENTER ONLY)
Abs Immature Granulocytes: 0.01 10*3/uL (ref 0.00–0.07)
Basophils Absolute: 0 10*3/uL (ref 0.0–0.1)
Basophils Relative: 1 %
Eosinophils Absolute: 0.1 10*3/uL (ref 0.0–0.5)
Eosinophils Relative: 2 %
HCT: 36.6 % (ref 36.0–46.0)
Hemoglobin: 11.9 g/dL — ABNORMAL LOW (ref 12.0–15.0)
Immature Granulocytes: 0 %
Lymphocytes Relative: 29 %
Lymphs Abs: 2.4 10*3/uL (ref 0.7–4.0)
MCH: 30.9 pg (ref 26.0–34.0)
MCHC: 32.5 g/dL (ref 30.0–36.0)
MCV: 95.1 fL (ref 80.0–100.0)
Monocytes Absolute: 0.8 10*3/uL (ref 0.1–1.0)
Monocytes Relative: 10 %
Neutro Abs: 4.8 10*3/uL (ref 1.7–7.7)
Neutrophils Relative %: 58 %
Platelet Count: 182 10*3/uL (ref 150–400)
RBC: 3.85 MIL/uL — ABNORMAL LOW (ref 3.87–5.11)
RDW: 13.5 % (ref 11.5–15.5)
WBC Count: 8.2 10*3/uL (ref 4.0–10.5)
nRBC: 0 % (ref 0.0–0.2)

## 2021-12-30 NOTE — Progress Notes (Signed)
[place]  [phone] [phone]     ID: REAH JUSTO DOB: [birth year]  MR#: [id number]  KZL#:[id number]  Patient Care Team: Reynold Bowen, MD as PCP - General (Endocrinology) Magrinat, Virgie Dad, MD (Inactive) as Consulting Physician (Oncology) Willodean Rosenthal, MD as Referring Physician (Surgery) Kimmick, Linus Mako, MD as Referring Physician (Oncology) Eppie Gibson, MD as Attending Physician (Radiation Oncology) Salvadore Dom, MD as Consulting Physician (Obstetrics and Gynecology) OTHER MD:  CHIEF COMPLAINT: Estrogen receptor positive breast cancer  CURRENT TREATMENT: tamoxifen 10 mg   INTERVAL HISTORY: [name] [name] returns today for follow-up of her estrogen receptor positive breast cancer.  She has been taking Tamoxifen 10 mg as instructed by Dr Jana Hakim and has been doing very well overall. She is very active, exercises daily. No change in breathing or asymmetrical leg swelling A detailed review of systems was otherwise stable   COVID 19 VACCINATION STATUS: Pfizer x4   HISTORY OF CURRENT ILLNESS: From the original intake note:  "[name] [name]" underwent right breast upper inner quadrant lumpectomy for ductal carcinoma in situ, 08/10/2012. She was treated with adjuvant radiation to a total of 48 gray, completed 11/13/2012.  MRI guided biopsy of a central left breast lesion 05/25/2013 showed sclerosing adenosis.  On 07/30/2015 she underwent bilateral diagnostic mammography with tomography. The breast density was category C. In addition to prior scars there was a 0.3 cm area of dystrophic calcifications. This was felt to be likely benign and 6 month follow-up was recommended.  That was performed 01/15/2016. The calcifications now appeared to be associated with an oil cyst.   More recently, in April 2018 the patient's gynecologist palpated a change in the left breast, at the 3:00 area, 3 fingerbreadths from the areola. Left  diagnostic mammography and ultrasonography at the [facility] 06/03/2016 showed only changes of prior excisional biopsies. On physical exam however there was a firm thickening in the lateral left breast, at the site of a prior biopsy. Targeted ultrasonography of this area (3:00 radiant 5 cm from the nipple) found an irregular hypoechoic region measuring 0.8 cm. This underlying the scar. Ultrasound of the left axilla was benign.  Biopsy of the left breast area in question 06/09/2016 showed (S [id number]) and invasive ductal carcinoma, E-cadherin positive, estrogen receptor 80% positive, progesterone receptor 60% positive, both with strong staining intensity, with an MIB-1 of 15% and no HER-2 amplification, the signals ratio being 1.00 and the number per cell 1.15.  The patient then was further evaluated at [facility] and on 07/12/2016 underwent left lumpectomy and sentinel lymph node sampling, with the final pathology (SP [id number]) finding invasive adenocarcinoma with mixed lobular and ductal features, but E-cadherin positive, grade 2, measuring 1.9 cm. Both sentinel lymph nodes were benign.  An Oncotype DX score was obtained on this sample, and it was 21; the patient was referred to radiation oncology, which was done in [place] and completed 10/08/2016.  The patient's subsequent history is as detailed below.   PAST MEDICAL HISTORY: Past Medical History:  Diagnosis Date   Acute asthmatic bronchitis    Asthma, mild intermittent    Breast cancer ([place]) 08/2012   right-DCIS, +ER/PR   Cystocele    Grade 2   Diverticulosis 2004   Fibroids 1998   Fracture of fibula 2007   left;  hit with golf ball   Heart murmur    nl echo   History of radiation therapy 09/16/2016- 10/08/2016   Left Breast. 42.56 Gy in 16 fractions.  History of TMJ disorder    Hx of radiation therapy 10/11/12- 11/13/12   right breast 4800 cGy 24 sessions   Hypertension    Hypothyroidism    Osteopenia 2002   Personal history of  radiation therapy 2014   right br   Personal history of radiation therapy 2018   left br   Raynaud's disease    Shingles    Vasomotor rhinitis     PAST SURGICAL HISTORY: Past Surgical History:  Procedure Laterality Date   ABDOMINAL HYSTERECTOMY     BREAST BIOPSY Left 2015   MRI- Benign   BREAST BIOPSY Right 07/05/2012   Stereo- Malignant   BREAST EXCISIONAL BIOPSY Left    BREAST EXCISIONAL BIOPSY Left    BREAST LUMPECTOMY Right 08/10/12   [place], NO RESIDUAL DISEASE   HYSTEROSCOPY WITH RESECTOSCOPE  2002   submucous fibroid   INCONTINENCE SURGERY     MINOR BREAST BIOPSY Left    times two   reverse shoulder replacement Right 03/2019   ROTATOR CUFF REPAIR Right 2010   THYROIDECTOMY      FAMILY HISTORY Family History  Problem Relation Age of Onset   Aneurysm Mother    Hypertension Sister    Cancer Sister        tongue   Clotting disorder Maternal Grandfather        liver   Cancer Maternal Grandfather        liver   Breast cancer Neg Hx   The patient's father died of pneumonia at age 44. The patient's mother died at the age of 63 from a ruptured brain aneurysm. The patient had no brothers. The patient had 4 sisters. One sister developed tongue cancer. There is no history of breast or ovarian cancer in the family to her knowledge   GYNECOLOGIC HISTORY:  Patient's last menstrual period was 01/02/2000. Menarche age 28, first live birth age 56, the patient is [employer] P2. She stopped having periods around the year 2000. She used hormone replacement approximately 13 years, until July 2014.   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 Her husband Catlynn Grondahl owns a local [employer] franchise.  They moved to [place] in 2022.  Son Wille Glaser lives in [place], works in Insurance underwriter. Son Quay Burow is currently [employer]. Incidentally Wille Glaser had a chronic granulomatous disease and received a stem cell transplant from his brother, with excellent results.  His third child  is due within the next 2 months.  The patient already has 5 grandchildren. She attends [employer]     ADVANCED DIRECTIVES: In the absence of any documents to the contrary the patient's husband is her healthcare power of attorney   HEALTH MAINTENANCE: Social History   Tobacco Use   Smoking status: Never   Smokeless tobacco: Never  Substance Use Topics   Alcohol use: Yes    Alcohol/week: 6.0 standard drinks of alcohol    Types: 6 Standard drinks or equivalent per week   Drug use: No     Colonoscopy:  PAP:  Bone density: 12/10/2014 showed a T score of -2.4   Allergies  Allergen Reactions   Pneumovax 23  [Pneumococcal Vac Polyvalent] Swelling    Swollen upper arm   Effexor [Venlafaxine] Nausea And Vomiting   Penicillins Hives   Pneumovax [Pneumococcal Polysaccharide Vaccine] Swelling    Current Outpatient Medications  Medication Sig Dispense Refill   cholecalciferol (VITAMIN D) 1000 UNITS tablet Take 1,000 Units by mouth 2 (two) times a week.  levothyroxine (SYNTHROID, LEVOTHROID) 75 MCG tablet Take 75 mcg by mouth. 6 days a week take 1 pill & 1 day a week take 1/2 a pill     loratadine (CLARITIN) 5 MG chewable tablet Chew 5 mg by mouth daily. Uses as needed (Patient not taking: Reported on 12/29/2021)     meloxicam (MOBIC) 15 MG tablet Take 15 mg by mouth daily.     Probiotic Product (PROBIOTIC PO) Take by mouth.     tamoxifen (NOLVADEX) 10 MG tablet TAKE 1 TABLET(10 MG) BY MOUTH DAILY (Patient not taking: Reported on 12/29/2021) 90 tablet 4   zolpidem (AMBIEN) 5 MG tablet Take 5 mg by mouth at bedtime as needed for sleep.     No current facility-administered medications for this visit.    OBJECTIVE: white woman in no acute distress  Vitals:   12/30/21 1323  BP: (!) 119/105  Pulse: 72  Resp: 16  Temp: 98.1 F (36.7 C)  SpO2: 100%      Body mass index is 23.01 kg/m.   Wt Readings from Last 3 Encounters:  12/30/21 121 lb 12.8 oz (55.2 kg)  12/29/21 121 lb  (54.9 kg)  01/05/21 120 lb 9.6 oz (54.7 kg)     ECOG FS:1 - Symptomatic but completely ambulatory  Sclerae unicteric, EOMs intact Wearing a mask No cervical or supraclavicular adenopathy Bilateral breasts inspected, no palpable masses or regional adenopathy.   LAB RESULTS:  CMP     Component Value Date/Time   NA 139 12/30/2021 1314   NA 142 10/11/2016 1547   K 3.6 12/30/2021 1314   K 4.3 10/11/2016 1547   CL 107 12/30/2021 1314   CO2 26 12/30/2021 1314   CO2 26 10/11/2016 1547   GLUCOSE 123 (H) 12/30/2021 1314   GLUCOSE 88 10/11/2016 1547   BUN 23 12/30/2021 1314   BUN 18.5 10/11/2016 1547   CREATININE 0.67 12/30/2021 1314   CREATININE 0.7 10/11/2016 1547   CALCIUM 9.2 12/30/2021 1314   CALCIUM 9.4 10/11/2016 1547   PROT 7.8 12/30/2021 1314   PROT 7.6 10/11/2016 1547   ALBUMIN 4.2 12/30/2021 1314   ALBUMIN 3.7 10/11/2016 1547   AST 16 12/30/2021 1314   AST 19 10/11/2016 1547   ALT 12 12/30/2021 1314   ALT 12 10/11/2016 1547   ALKPHOS 85 12/30/2021 1314   ALKPHOS 103 10/11/2016 1547   BILITOT 0.3 12/30/2021 1314   BILITOT 0.23 10/11/2016 1547   GFRNONAA >60 12/30/2021 1314   GFRAA >60 01/03/2019 1059    No results found for: "TOTALPROTELP", "ALBUMINELP", "A1GS", "A2GS", "BETS", "BETA2SER", "GAMS", "MSPIKE", "SPEI"  No results found for: "KPAFRELGTCHN", "LAMBDASER", "KAPLAMBRATIO"  Lab Results  Component Value Date   WBC 8.2 12/30/2021   NEUTROABS 4.8 12/30/2021   HGB 11.9 (L) 12/30/2021   HCT 36.6 12/30/2021   MCV 95.1 12/30/2021   PLT 182 12/30/2021      Chemistry      Component Value Date/Time   NA 139 12/30/2021 1314   NA 142 10/11/2016 1547   K 3.6 12/30/2021 1314   K 4.3 10/11/2016 1547   CL 107 12/30/2021 1314   CO2 26 12/30/2021 1314   CO2 26 10/11/2016 1547   BUN 23 12/30/2021 1314   BUN 18.5 10/11/2016 1547   CREATININE 0.67 12/30/2021 1314   CREATININE 0.7 10/11/2016 1547      Component Value Date/Time   CALCIUM 9.2 12/30/2021 1314    CALCIUM 9.4 10/11/2016 1547   ALKPHOS 85 12/30/2021 1314  ALKPHOS 103 10/11/2016 1547   AST 16 12/30/2021 1314   AST 19 10/11/2016 1547   ALT 12 12/30/2021 1314   ALT 12 10/11/2016 1547   BILITOT 0.3 12/30/2021 1314   BILITOT 0.23 10/11/2016 1547       No results found for: "LABCA2"  No components found for: "ZCHYIF027"  No results for input(s): "INR" in the last 168 hours.  No results found for: "LABCA2"  No results found for: "XAJ287"  No results found for: "CAN125"  No results found for: "CAN153"  No results found for: "CA2729"  No components found for: "HGQUANT"  No results found for: "CEA1", "CEA" / No results found for: "CEA1", "CEA"   No results found for: "AFPTUMOR"  No results found for: "CHROMOGRNA"  No results found for: "HGBA", "HGBA2QUANT", "HGBFQUANT", "HGBSQUAN" (Hemoglobinopathy evaluation)   No results found for: "LDH"  No results found for: "IRON", "TIBC", "IRONPCTSAT" (Iron and TIBC)  No results found for: "FERRITIN"  Urinalysis    Component Value Date/Time   BILIRUBINUR n 04/04/2015 1330   PROTEINUR n 04/04/2015 1330   UROBILINOGEN negative 04/04/2015 1330   NITRITE n 04/04/2015 1330   LEUKOCYTESUR Negative 04/04/2015 1330    STUDIES: No results found.   ELIGIBLE FOR AVAILABLE RESEARCH PROTOCOL: no  ASSESSMENT: 76 y.o. [place] woman  RIGHT BREAST CANCER (1) status post right lumpectomy and margin clearance 08/10/2012 for ductal carcinoma in situ, grade 2 or 3, estrogen and progesterone receptor positive,  (2) adjuvant radiation to the right breast completed 11/13/2012.  LEFT BREAST CANCER (3) status post left breast biopsy 05/25/2013 for sclerosing adenosis.  (4) left breast biopsy 06/09/2016 showed a clinical T1b N0, stage IA invasive ductal carcinoma (E-cadherin positive) estrogen and progesterone receptor positive, HER-2 nonamplified, with an MIB-1 of 15%.  (5) left lumpectomy and sentinel lymph node sampling  07/12/2016 showed a pT1c pN0, stage IA invasive ductal carcinoma, grade 2, with clear margins.  (6) Oncotype DX score of 21 predicted a 10 year risk of outside the breast recurrence of 13% if the patient's only systemic therapy was tamoxifen for 5 years. It also predicted no benefit from chemotherapy  (7) adjuvant radiation completed 10/08/2016  (8) started tamoxifen 11/01/2016  (a) tamoxifen discontinued AUG 2020 with hot flashes  (b) anastrozole TIW started August 2020, discontinued NOV 2020  (c) tamoxifen resumed NOV 2020 at 10 mg    PLAN:  [name] [name] is now 5 years out from her most recent breast cancer surgery, with no evidence of disease recurrence.  This is very favorable. We discussed that she is now done with anti estrogen therapy. She is a robust 76 yr old in excellent shape. No concerns on exam today. Mammogram due in May 2024, ordered. She will RTC in one yr or sooner as needed.  Total time spent: 20 min *Total Encounter Time as defined by the Centers for Medicare and Medicaid Services includes, in addition to the face-to-face time of a patient visit (documented in the note above) non-face-to-face time: obtaining and reviewing outside history, ordering and reviewing medications, tests or procedures, care coordination (communications with other health care professionals or caregivers) and documentation in the medical record.

## 2021-12-31 ENCOUNTER — Encounter: Payer: Self-pay | Admitting: Obstetrics and Gynecology

## 2021-12-31 DIAGNOSIS — M8580 Other specified disorders of bone density and structure, unspecified site: Secondary | ICD-10-CM | POA: Diagnosis not present

## 2021-12-31 DIAGNOSIS — I700 Atherosclerosis of aorta: Secondary | ICD-10-CM | POA: Diagnosis not present

## 2021-12-31 DIAGNOSIS — I10 Essential (primary) hypertension: Secondary | ICD-10-CM | POA: Diagnosis not present

## 2021-12-31 DIAGNOSIS — E2839 Other primary ovarian failure: Secondary | ICD-10-CM

## 2021-12-31 DIAGNOSIS — G629 Polyneuropathy, unspecified: Secondary | ICD-10-CM | POA: Diagnosis not present

## 2021-12-31 DIAGNOSIS — Z853 Personal history of malignant neoplasm of breast: Secondary | ICD-10-CM | POA: Diagnosis not present

## 2021-12-31 DIAGNOSIS — G4700 Insomnia, unspecified: Secondary | ICD-10-CM | POA: Diagnosis not present

## 2021-12-31 DIAGNOSIS — H04123 Dry eye syndrome of bilateral lacrimal glands: Secondary | ICD-10-CM | POA: Diagnosis not present

## 2021-12-31 DIAGNOSIS — M810 Age-related osteoporosis without current pathological fracture: Secondary | ICD-10-CM

## 2021-12-31 DIAGNOSIS — L930 Discoid lupus erythematosus: Secondary | ICD-10-CM | POA: Diagnosis not present

## 2021-12-31 DIAGNOSIS — E890 Postprocedural hypothyroidism: Secondary | ICD-10-CM | POA: Diagnosis not present

## 2022-01-04 NOTE — Telephone Encounter (Signed)
Can you please check with the breast center and see what they suggest. Ideally the DEXA should be done on the same machine.

## 2022-01-06 NOTE — Telephone Encounter (Signed)
If they can't get her in for 5 months, then I would recommend that she do her DEXA here.

## 2022-01-07 NOTE — Telephone Encounter (Signed)
Dexa order placed. Sent to appointments to schedule.

## 2022-01-15 ENCOUNTER — Other Ambulatory Visit: Payer: Medicare Other

## 2022-01-15 ENCOUNTER — Ambulatory Visit: Payer: Medicare Other | Admitting: Hematology and Oncology

## 2022-01-19 ENCOUNTER — Ambulatory Visit (INDEPENDENT_AMBULATORY_CARE_PROVIDER_SITE_OTHER): Payer: Medicare Other

## 2022-01-19 ENCOUNTER — Other Ambulatory Visit: Payer: Self-pay | Admitting: Obstetrics and Gynecology

## 2022-01-19 DIAGNOSIS — Z13820 Encounter for screening for osteoporosis: Secondary | ICD-10-CM

## 2022-01-19 DIAGNOSIS — Z780 Asymptomatic menopausal state: Secondary | ICD-10-CM | POA: Diagnosis not present

## 2022-01-19 DIAGNOSIS — M8589 Other specified disorders of bone density and structure, multiple sites: Secondary | ICD-10-CM

## 2022-01-19 DIAGNOSIS — E2839 Other primary ovarian failure: Secondary | ICD-10-CM

## 2022-01-19 DIAGNOSIS — M810 Age-related osteoporosis without current pathological fracture: Secondary | ICD-10-CM

## 2022-01-21 DIAGNOSIS — K1239 Other oral mucositis (ulcerative): Secondary | ICD-10-CM | POA: Diagnosis not present

## 2022-01-21 DIAGNOSIS — K1370 Unspecified lesions of oral mucosa: Secondary | ICD-10-CM | POA: Diagnosis not present

## 2022-03-01 ENCOUNTER — Other Ambulatory Visit: Payer: Self-pay

## 2022-04-05 ENCOUNTER — Encounter: Payer: Self-pay | Admitting: Obstetrics and Gynecology

## 2022-04-05 DIAGNOSIS — N951 Menopausal and female climacteric states: Secondary | ICD-10-CM

## 2022-04-05 NOTE — Telephone Encounter (Signed)
Last AEX 12/29/2021--no f/u scheduled and no current recall placed. Place recall for late 12/2022 since hx of breast cancer?   Please advise.

## 2022-04-06 NOTE — Telephone Encounter (Signed)
Rx pending if you feel appropriate to prescribe. Thanks.

## 2022-04-07 MED ORDER — GABAPENTIN 100 MG PO CAPS: ORAL_CAPSULE | ORAL | 0 refills | Status: DC

## 2022-04-20 DIAGNOSIS — R0602 Shortness of breath: Secondary | ICD-10-CM | POA: Diagnosis not present

## 2022-04-21 DIAGNOSIS — D485 Neoplasm of uncertain behavior of skin: Secondary | ICD-10-CM | POA: Diagnosis not present

## 2022-04-21 DIAGNOSIS — L930 Discoid lupus erythematosus: Secondary | ICD-10-CM | POA: Diagnosis not present

## 2022-05-17 DIAGNOSIS — N182 Chronic kidney disease, stage 2 (mild): Secondary | ICD-10-CM | POA: Diagnosis not present

## 2022-05-17 DIAGNOSIS — M19041 Primary osteoarthritis, right hand: Secondary | ICD-10-CM | POA: Diagnosis not present

## 2022-05-17 DIAGNOSIS — M19042 Primary osteoarthritis, left hand: Secondary | ICD-10-CM | POA: Diagnosis not present

## 2022-05-17 DIAGNOSIS — F1090 Alcohol use, unspecified, uncomplicated: Secondary | ICD-10-CM | POA: Diagnosis not present

## 2022-05-17 DIAGNOSIS — Z789 Other specified health status: Secondary | ICD-10-CM | POA: Diagnosis not present

## 2022-06-08 DIAGNOSIS — I10 Essential (primary) hypertension: Secondary | ICD-10-CM | POA: Diagnosis not present

## 2022-06-08 DIAGNOSIS — M5416 Radiculopathy, lumbar region: Secondary | ICD-10-CM | POA: Diagnosis not present

## 2022-06-23 DIAGNOSIS — H04123 Dry eye syndrome of bilateral lacrimal glands: Secondary | ICD-10-CM | POA: Diagnosis not present

## 2022-06-24 DIAGNOSIS — D649 Anemia, unspecified: Secondary | ICD-10-CM | POA: Diagnosis not present

## 2022-06-24 DIAGNOSIS — M8580 Other specified disorders of bone density and structure, unspecified site: Secondary | ICD-10-CM | POA: Diagnosis not present

## 2022-06-24 DIAGNOSIS — R7989 Other specified abnormal findings of blood chemistry: Secondary | ICD-10-CM | POA: Diagnosis not present

## 2022-06-24 DIAGNOSIS — E890 Postprocedural hypothyroidism: Secondary | ICD-10-CM | POA: Diagnosis not present

## 2022-06-24 DIAGNOSIS — M109 Gout, unspecified: Secondary | ICD-10-CM | POA: Diagnosis not present

## 2022-06-24 DIAGNOSIS — I700 Atherosclerosis of aorta: Secondary | ICD-10-CM | POA: Diagnosis not present

## 2022-06-24 DIAGNOSIS — I10 Essential (primary) hypertension: Secondary | ICD-10-CM | POA: Diagnosis not present

## 2022-06-24 DIAGNOSIS — Z0000 Encounter for general adult medical examination without abnormal findings: Secondary | ICD-10-CM | POA: Diagnosis not present

## 2022-06-30 DIAGNOSIS — Z419 Encounter for procedure for purposes other than remedying health state, unspecified: Secondary | ICD-10-CM | POA: Diagnosis not present

## 2022-06-30 DIAGNOSIS — H01001 Unspecified blepharitis right upper eyelid: Secondary | ICD-10-CM | POA: Diagnosis not present

## 2022-06-30 DIAGNOSIS — L718 Other rosacea: Secondary | ICD-10-CM | POA: Diagnosis not present

## 2022-06-30 DIAGNOSIS — L930 Discoid lupus erythematosus: Secondary | ICD-10-CM | POA: Diagnosis not present

## 2022-07-02 ENCOUNTER — Ambulatory Visit: Payer: Medicare Other

## 2022-07-08 ENCOUNTER — Ambulatory Visit: Payer: Medicare Other

## 2022-07-08 DIAGNOSIS — G629 Polyneuropathy, unspecified: Secondary | ICD-10-CM | POA: Diagnosis not present

## 2022-07-08 DIAGNOSIS — J45909 Unspecified asthma, uncomplicated: Secondary | ICD-10-CM | POA: Diagnosis not present

## 2022-07-08 DIAGNOSIS — Z0000 Encounter for general adult medical examination without abnormal findings: Secondary | ICD-10-CM | POA: Diagnosis not present

## 2022-07-08 DIAGNOSIS — I5189 Other ill-defined heart diseases: Secondary | ICD-10-CM | POA: Diagnosis not present

## 2022-07-08 DIAGNOSIS — I10 Essential (primary) hypertension: Secondary | ICD-10-CM | POA: Diagnosis not present

## 2022-07-08 DIAGNOSIS — E785 Hyperlipidemia, unspecified: Secondary | ICD-10-CM | POA: Diagnosis not present

## 2022-07-08 DIAGNOSIS — Z853 Personal history of malignant neoplasm of breast: Secondary | ICD-10-CM | POA: Diagnosis not present

## 2022-07-08 DIAGNOSIS — G4700 Insomnia, unspecified: Secondary | ICD-10-CM | POA: Diagnosis not present

## 2022-07-08 DIAGNOSIS — E890 Postprocedural hypothyroidism: Secondary | ICD-10-CM | POA: Diagnosis not present

## 2022-07-08 DIAGNOSIS — I700 Atherosclerosis of aorta: Secondary | ICD-10-CM | POA: Diagnosis not present

## 2022-07-08 DIAGNOSIS — M5416 Radiculopathy, lumbar region: Secondary | ICD-10-CM | POA: Diagnosis not present

## 2022-07-08 DIAGNOSIS — M8580 Other specified disorders of bone density and structure, unspecified site: Secondary | ICD-10-CM | POA: Diagnosis not present

## 2022-07-20 ENCOUNTER — Ambulatory Visit: Payer: Medicare Other

## 2022-07-21 ENCOUNTER — Ambulatory Visit
Admission: RE | Admit: 2022-07-21 | Discharge: 2022-07-21 | Disposition: A | Discharge status: Home / Self Care | Payer: Medicare Other | Source: Ambulatory Visit | Attending: Hematology and Oncology | Admitting: Hematology and Oncology

## 2022-07-21 DIAGNOSIS — C50812 Malignant neoplasm of overlapping sites of left female breast: Secondary | ICD-10-CM

## 2022-07-21 DIAGNOSIS — Z1231 Encounter for screening mammogram for malignant neoplasm of breast: Secondary | ICD-10-CM | POA: Diagnosis not present

## 2022-08-25 ENCOUNTER — Other Ambulatory Visit: Payer: Self-pay

## 2022-08-25 DIAGNOSIS — N951 Menopausal and female climacteric states: Secondary | ICD-10-CM

## 2022-08-25 NOTE — Telephone Encounter (Signed)
Medication refill request: gabapentin 100mg  Last AEX:  12-29-21 Next AEX: not scheduled Last MMG (if hormonal medication request): 07-21-22 birads 1:neg Refill authorized: please approve if appropriate

## 2022-08-26 ENCOUNTER — Other Ambulatory Visit: Payer: Self-pay | Admitting: Endocrinology

## 2022-08-26 DIAGNOSIS — M5416 Radiculopathy, lumbar region: Secondary | ICD-10-CM

## 2022-08-26 MED ORDER — GABAPENTIN 100 MG PO CAPS: ORAL_CAPSULE | ORAL | 0 refills | Status: DC

## 2022-09-18 ENCOUNTER — Ambulatory Visit: Admission: RE | Admit: 2022-09-18 | Payer: Medicare Other | Source: Ambulatory Visit

## 2022-09-18 DIAGNOSIS — M5126 Other intervertebral disc displacement, lumbar region: Secondary | ICD-10-CM | POA: Diagnosis not present

## 2022-09-18 DIAGNOSIS — Z853 Personal history of malignant neoplasm of breast: Secondary | ICD-10-CM | POA: Diagnosis not present

## 2022-09-18 DIAGNOSIS — M5416 Radiculopathy, lumbar region: Secondary | ICD-10-CM | POA: Diagnosis not present

## 2022-09-18 DIAGNOSIS — M5125 Other intervertebral disc displacement, thoracolumbar region: Secondary | ICD-10-CM | POA: Diagnosis not present

## 2022-09-18 MED ORDER — GADOPICLENOL 0.5 MMOL/ML IV SOLN
7.5000 mL | Freq: Once | INTRAVENOUS | Status: AC | PRN
  Administered 2022-09-18: 5 mL via INTRAVENOUS

## 2022-09-19 ENCOUNTER — Other Ambulatory Visit: Payer: Medicare Other

## 2022-11-04 DIAGNOSIS — Z23 Encounter for immunization: Secondary | ICD-10-CM | POA: Diagnosis not present

## 2022-11-11 DIAGNOSIS — M5416 Radiculopathy, lumbar region: Secondary | ICD-10-CM | POA: Diagnosis not present

## 2022-11-25 DIAGNOSIS — M47816 Spondylosis without myelopathy or radiculopathy, lumbar region: Secondary | ICD-10-CM | POA: Diagnosis not present

## 2022-12-01 DIAGNOSIS — H6992 Unspecified Eustachian tube disorder, left ear: Secondary | ICD-10-CM | POA: Diagnosis not present

## 2022-12-01 DIAGNOSIS — S0003XA Contusion of scalp, initial encounter: Secondary | ICD-10-CM | POA: Diagnosis not present

## 2022-12-01 DIAGNOSIS — R42 Dizziness and giddiness: Secondary | ICD-10-CM | POA: Diagnosis not present

## 2022-12-01 DIAGNOSIS — I10 Essential (primary) hypertension: Secondary | ICD-10-CM | POA: Diagnosis not present

## 2022-12-03 DIAGNOSIS — H6992 Unspecified Eustachian tube disorder, left ear: Secondary | ICD-10-CM | POA: Diagnosis not present

## 2022-12-03 DIAGNOSIS — I10 Essential (primary) hypertension: Secondary | ICD-10-CM | POA: Diagnosis not present

## 2022-12-03 DIAGNOSIS — M26609 Unspecified temporomandibular joint disorder, unspecified side: Secondary | ICD-10-CM | POA: Diagnosis not present

## 2022-12-03 DIAGNOSIS — R42 Dizziness and giddiness: Secondary | ICD-10-CM | POA: Diagnosis not present

## 2022-12-17 DIAGNOSIS — H6502 Acute serous otitis media, left ear: Secondary | ICD-10-CM | POA: Diagnosis not present

## 2023-01-03 ENCOUNTER — Other Ambulatory Visit: Payer: Self-pay | Admitting: Obstetrics and Gynecology

## 2023-01-03 DIAGNOSIS — N951 Menopausal and female climacteric states: Secondary | ICD-10-CM

## 2023-01-04 ENCOUNTER — Other Ambulatory Visit: Payer: Self-pay | Admitting: Obstetrics and Gynecology

## 2023-01-04 DIAGNOSIS — N951 Menopausal and female climacteric states: Secondary | ICD-10-CM

## 2023-01-04 MED ORDER — GABAPENTIN 100 MG PO CAPS: ORAL_CAPSULE | ORAL | 0 refills | Status: AC

## 2023-01-04 NOTE — Telephone Encounter (Signed)
Medication refill request: gabapentin 100mg  Last AEX:  12-29-21 Next AEX: not scheduled Last MMG (if hormonal medication request): 07-21-22 birads 1:neg Refill authorized: please approve if appropriate

## 2023-01-06 DIAGNOSIS — L930 Discoid lupus erythematosus: Secondary | ICD-10-CM | POA: Diagnosis not present

## 2023-01-06 DIAGNOSIS — G629 Polyneuropathy, unspecified: Secondary | ICD-10-CM | POA: Diagnosis not present

## 2023-01-06 DIAGNOSIS — J45909 Unspecified asthma, uncomplicated: Secondary | ICD-10-CM | POA: Diagnosis not present

## 2023-01-06 DIAGNOSIS — E785 Hyperlipidemia, unspecified: Secondary | ICD-10-CM | POA: Diagnosis not present

## 2023-01-06 DIAGNOSIS — I700 Atherosclerosis of aorta: Secondary | ICD-10-CM | POA: Diagnosis not present

## 2023-01-06 DIAGNOSIS — M5416 Radiculopathy, lumbar region: Secondary | ICD-10-CM | POA: Diagnosis not present

## 2023-01-06 DIAGNOSIS — Z853 Personal history of malignant neoplasm of breast: Secondary | ICD-10-CM | POA: Diagnosis not present

## 2023-01-06 DIAGNOSIS — M109 Gout, unspecified: Secondary | ICD-10-CM | POA: Diagnosis not present

## 2023-01-06 DIAGNOSIS — E890 Postprocedural hypothyroidism: Secondary | ICD-10-CM | POA: Diagnosis not present

## 2023-01-06 DIAGNOSIS — D649 Anemia, unspecified: Secondary | ICD-10-CM | POA: Diagnosis not present

## 2023-01-06 DIAGNOSIS — I10 Essential (primary) hypertension: Secondary | ICD-10-CM | POA: Diagnosis not present

## 2023-01-06 DIAGNOSIS — M8580 Other specified disorders of bone density and structure, unspecified site: Secondary | ICD-10-CM | POA: Diagnosis not present

## 2023-01-10 ENCOUNTER — Other Ambulatory Visit: Payer: Self-pay | Admitting: *Deleted

## 2023-01-10 DIAGNOSIS — Z170 Estrogen receptor positive status [ER+]: Secondary | ICD-10-CM

## 2023-01-11 ENCOUNTER — Inpatient Hospital Stay: Payer: Medicare Other

## 2023-01-11 ENCOUNTER — Inpatient Hospital Stay: Payer: Medicare Other | Attending: Hematology and Oncology | Admitting: Hematology and Oncology

## 2023-01-11 VITALS — BP 196/66 | HR 101 | Temp 97.9°F | Resp 18 | Wt 123.7 lb

## 2023-01-11 DIAGNOSIS — C50812 Malignant neoplasm of overlapping sites of left female breast: Secondary | ICD-10-CM | POA: Insufficient documentation

## 2023-01-11 DIAGNOSIS — M8580 Other specified disorders of bone density and structure, unspecified site: Secondary | ICD-10-CM | POA: Insufficient documentation

## 2023-01-11 DIAGNOSIS — Z170 Estrogen receptor positive status [ER+]: Secondary | ICD-10-CM

## 2023-01-11 LAB — CBC WITH DIFFERENTIAL (CANCER CENTER ONLY)
Abs Immature Granulocytes: 0.01 10*3/uL (ref 0.00–0.07)
Basophils Absolute: 0 10*3/uL (ref 0.0–0.1)
Basophils Relative: 1 %
Eosinophils Absolute: 0.1 10*3/uL (ref 0.0–0.5)
Eosinophils Relative: 2 %
HCT: 37.1 % (ref 36.0–46.0)
Hemoglobin: 12.2 g/dL (ref 12.0–15.0)
Immature Granulocytes: 0 %
Lymphocytes Relative: 29 %
Lymphs Abs: 1.9 10*3/uL (ref 0.7–4.0)
MCH: 30.6 pg (ref 26.0–34.0)
MCHC: 32.9 g/dL (ref 30.0–36.0)
MCV: 93 fL (ref 80.0–100.0)
Monocytes Absolute: 0.7 10*3/uL (ref 0.1–1.0)
Monocytes Relative: 10 %
Neutro Abs: 3.7 10*3/uL (ref 1.7–7.7)
Neutrophils Relative %: 58 %
Platelet Count: 266 10*3/uL (ref 150–400)
RBC: 3.99 MIL/uL (ref 3.87–5.11)
RDW: 13.2 % (ref 11.5–15.5)
WBC Count: 6.4 10*3/uL (ref 4.0–10.5)
nRBC: 0 % (ref 0.0–0.2)

## 2023-01-11 LAB — CMP (CANCER CENTER ONLY)
ALT: 12 U/L (ref 0–44)
AST: 19 U/L (ref 15–41)
Albumin: 4.3 g/dL (ref 3.5–5.0)
Alkaline Phosphatase: 99 U/L (ref 38–126)
Anion gap: 6 (ref 5–15)
BUN: 16 mg/dL (ref 8–23)
CO2: 29 mmol/L (ref 22–32)
Calcium: 9.7 mg/dL (ref 8.9–10.3)
Chloride: 105 mmol/L (ref 98–111)
Creatinine: 0.6 mg/dL (ref 0.44–1.00)
GFR, Estimated: >60 mL/min (ref >60)
Glucose, Bld: 82 mg/dL (ref 70–99)
Potassium: 4.2 mmol/L (ref 3.5–5.1)
Sodium: 140 mmol/L (ref 135–145)
Total Bilirubin: 0.3 mg/dL (ref <1.2)
Total Protein: 8.3 g/dL — ABNORMAL HIGH (ref 6.5–8.1)

## 2023-01-11 NOTE — Telephone Encounter (Signed)
Spoke with patient, states she is unable to talk, will return call at a later date/time.

## 2023-01-11 NOTE — Progress Notes (Signed)
[facility]  [phone] [phone]     ID: [name] [name] DOB: Jul 26, 1945  MR#: [id number]  CSN#:[id number]  Patient Care Team: Adrian Prince, MD as PCP - General (Endocrinology) Magrinat, Valentino Hue, MD (Inactive) as Consulting Physician (Oncology) Carylon Perches, MD as Referring Physician (Surgery) Kimmick, Bonnetta Barry, MD as Referring Physician (Oncology) Lonie Peak, MD as Attending Physician (Radiation Oncology) Romualdo Bolk, MD (Inactive) as Consulting Physician (Obstetrics and Gynecology) OTHER MD:  CHIEF COMPLAINT: Estrogen receptor positive breast cancer  CURRENT TREATMENT: tamoxifen 10 mg  INTERVAL HISTORY: Discussed the use of AI scribe software for clinical note transcription with the patient, who gave verbal consent to proceed.  History of Present Illness    The patient, with a history of breast cancer, osteopenia, and shoulder surgery, presents for a routine check-up. She has been off tamoxifen for over a year and reports tolerating it well during the time she was on it. She has been managing her osteopenia with vitamin D and regular exercise. She has been experiencing sciatica issues and has been seeing another doctor for this. She has not noticed any changes in her breasts, bowel habits, or urination.She also reports a history of shoulder surgery three years ago. Rest of the pertinent 10 point ROS reviewed and neg.   COVID 19 VACCINATION STATUS: Pfizer x4   HISTORY OF CURRENT ILLNESS: From the original intake note:  "[name] [name]" underwent right breast upper inner quadrant lumpectomy for ductal carcinoma in situ, 08/10/2012. She was treated with adjuvant radiation to a total of 48 gray, completed 11/13/2012.  MRI guided biopsy of a central left breast lesion 05/25/2013 showed sclerosing adenosis.  On 07/30/2015 she underwent bilateral diagnostic mammography with tomography. The breast density was category C. In  addition to prior scars there was a 0.3 cm area of dystrophic calcifications. This was felt to be likely benign and 6 month follow-up was recommended.  That was performed 01/15/2016. The calcifications now appeared to be associated with an oil cyst.   More recently, in April 2018 the patient's gynecologist palpated a change in the left breast, at the 3:00 area, 3 fingerbreadths from the areola. Left diagnostic mammography and ultrasonography at the [facility] 06/03/2016 showed only changes of prior excisional biopsies. On physical exam however there was a firm thickening in the lateral left breast, at the site of a prior biopsy. Targeted ultrasonography of this area (3:00 radiant 5 cm from the nipple) found an irregular hypoechoic region measuring 0.8 cm. This underlying the scar. Ultrasound of the left axilla was benign.  Biopsy of the left breast area in question 06/09/2016 showed (S [id number]) and invasive ductal carcinoma, E-cadherin positive, estrogen receptor 80% positive, progesterone receptor 60% positive, both with strong staining intensity, with an MIB-1 of 15% and no HER-2 amplification, the signals ratio being 1.00 and the number per cell 1.15.  The patient then was further evaluated at [facility] and on 07/12/2016 underwent left lumpectomy and sentinel lymph node sampling, with the final pathology (SP [id number]) finding invasive adenocarcinoma with mixed lobular and ductal features, but E-cadherin positive, grade 2, measuring 1.9 cm. Both sentinel lymph nodes were benign.  An Oncotype DX score was obtained on this sample, and it was 21; the patient was referred to radiation oncology, which was done in [place] and completed 10/08/2016.  The patient's subsequent history is as detailed below.   PAST MEDICAL HISTORY: Past Medical History:  Diagnosis Date   Acute asthmatic bronchitis    Asthma,  mild intermittent    Breast cancer (HCC) 08/2012   right-DCIS, +ER/PR   Cystocele    Grade 2    Diverticulosis 2004   Fibroids 1998   Fracture of fibula 2007   left;  hit with golf ball   Heart murmur    nl echo   History of radiation therapy 09/16/2016- 10/08/2016   Left Breast. 42.56 Gy in 16 fractions.    History of TMJ disorder    Hx of radiation therapy 10/11/12- 11/13/12   right breast 4800 cGy 24 sessions   Hypertension    Hypothyroidism    Osteopenia 2002   Personal history of radiation therapy 2014   right br   Personal history of radiation therapy 2018   left br   Raynaud's disease    Shingles    Vasomotor rhinitis     PAST SURGICAL HISTORY: Past Surgical History:  Procedure Laterality Date   ABDOMINAL HYSTERECTOMY     BREAST BIOPSY Left 2015   MRI- Benign   BREAST BIOPSY Right 07/05/2012   Stereo- Malignant   BREAST EXCISIONAL BIOPSY Left    BREAST EXCISIONAL BIOPSY Left    BREAST LUMPECTOMY Right 08/10/12   [facility], NO RESIDUAL DISEASE   HYSTEROSCOPY WITH RESECTOSCOPE  2002   submucous fibroid   INCONTINENCE SURGERY     MINOR BREAST BIOPSY Left    times two   reverse shoulder replacement Right 03/2019   ROTATOR CUFF REPAIR Right 2010   THYROIDECTOMY      FAMILY HISTORY Family History  Problem Relation Age of Onset   Aneurysm Mother    Hypertension Sister    Cancer Sister        tongue   Clotting disorder Maternal Grandfather        liver   Cancer Maternal Grandfather        liver   Breast cancer Neg Hx   The patient's father died of pneumonia at age 74. The patient's mother died at the age of 6 from a ruptured brain aneurysm. The patient had no brothers. The patient had 4 sisters. One sister developed tongue cancer. There is no history of breast or ovarian cancer in the family to her knowledge   GYNECOLOGIC HISTORY:  Patient's last menstrual period was 01/02/2000. Menarche age 47, first live birth age 34, the patient is GX P2. She stopped having periods around the year 2000. She used hormone replacement approximately 13 years, until July  2014.   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 Her husband [name] owns a local [employer] franchise.  They moved to [place] in 2022.  Son Cletis Athens lives in [place], works in Community education officer. Son [name] is currently [employer]. Incidentally Cletis Athens had a chronic granulomatous disease and received a stem cell transplant from his brother, with excellent results.  His third child is due within the next 2 months.  The patient already has 5 grandchildren. She attends [employer]    ADVANCED DIRECTIVES: In the absence of any documents to the contrary the patient's husband is her healthcare power of attorney   HEALTH MAINTENANCE: Social History   Tobacco Use   Smoking status: Never   Smokeless tobacco: Never  Substance Use Topics   Alcohol use: Yes    Alcohol/week: 6.0 standard drinks of alcohol    Types: 6 Standard drinks or equivalent per week   Drug use: No     Colonoscopy:  PAP:  Bone density: 12/10/2014 showed a T score of -2.4  Allergies  Allergen Reactions   Pneumovax 23  [Pneumococcal Vac Polyvalent] Swelling    Swollen upper arm   Effexor [Venlafaxine] Nausea And Vomiting   Penicillins Hives   Pneumovax [Pneumococcal Polysaccharide Vaccine] Swelling    Current Outpatient Medications  Medication Sig Dispense Refill   cholecalciferol (VITAMIN D) 1000 UNITS tablet Take 1,000 Units by mouth 2 (two) times a week.     gabapentin (NEURONTIN) 100 MG capsule Take one cap po q am and 2 caps po at bedtime.  Needs appointment for further refills. 270 capsule 0   levothyroxine (SYNTHROID, LEVOTHROID) 75 MCG tablet Take 75 mcg by mouth. 6 days a week take 1 pill & 1 day a week take 1/2 a pill     loratadine (CLARITIN) 5 MG chewable tablet Chew 5 mg by mouth daily. Uses as needed (Patient not taking: Reported on 12/29/2021)     meloxicam (MOBIC) 15 MG tablet Take 15 mg by mouth daily.     Probiotic Product (PROBIOTIC PO) Take by mouth.     zolpidem (AMBIEN) 5  MG tablet Take 5 mg by mouth at bedtime as needed for sleep.     No current facility-administered medications for this visit.    OBJECTIVE: white woman in no acute distress  Vitals:   01/11/23 1125  BP: (!) 196/66  Pulse: (!) 101  Resp: 18  Temp: 97.9 F (36.6 C)  SpO2: 93%      Body mass index is 23.37 kg/m.   Wt Readings from Last 3 Encounters:  01/11/23 123 lb 11.2 oz (56.1 kg)  12/30/21 121 lb 12.8 oz (55.2 kg)  12/29/21 121 lb (54.9 kg)     ECOG FS:1 - Symptomatic but completely ambulatory  Sclerae unicteric, EOMs intact Wearing a mask No cervical or supraclavicular adenopathy Bilateral breasts inspected, no palpable masses or regional adenopathy.  LAB RESULTS:  CMP     Component Value Date/Time   NA 139 12/30/2021 1314   NA 142 10/11/2016 1547   K 3.6 12/30/2021 1314   K 4.3 10/11/2016 1547   CL 107 12/30/2021 1314   CO2 26 12/30/2021 1314   CO2 26 10/11/2016 1547   GLUCOSE 123 (H) 12/30/2021 1314   GLUCOSE 88 10/11/2016 1547   BUN 23 12/30/2021 1314   BUN 18.5 10/11/2016 1547   CREATININE 0.67 12/30/2021 1314   CREATININE 0.7 10/11/2016 1547   CALCIUM 9.2 12/30/2021 1314   CALCIUM 9.4 10/11/2016 1547   PROT 7.8 12/30/2021 1314   PROT 7.6 10/11/2016 1547   ALBUMIN 4.2 12/30/2021 1314   ALBUMIN 3.7 10/11/2016 1547   AST 16 12/30/2021 1314   AST 19 10/11/2016 1547   ALT 12 12/30/2021 1314   ALT 12 10/11/2016 1547   ALKPHOS 85 12/30/2021 1314   ALKPHOS 103 10/11/2016 1547   BILITOT 0.3 12/30/2021 1314   BILITOT 0.23 10/11/2016 1547   GFRNONAA >60 12/30/2021 1314   GFRAA >60 01/03/2019 1059    No results found for: "TOTALPROTELP", "ALBUMINELP", "A1GS", "A2GS", "BETS", "BETA2SER", "GAMS", "MSPIKE", "SPEI"  No results found for: "KPAFRELGTCHN", "LAMBDASER", "KAPLAMBRATIO"  Lab Results  Component Value Date   WBC 6.4 01/11/2023   NEUTROABS 3.7 01/11/2023   HGB 12.2 01/11/2023   HCT 37.1 01/11/2023   MCV 93.0 01/11/2023   PLT 266 01/11/2023       Chemistry      Component Value Date/Time   NA 139 12/30/2021 1314   NA 142 10/11/2016 1547   K 3.6 12/30/2021 1314  K 4.3 10/11/2016 1547   CL 107 12/30/2021 1314   CO2 26 12/30/2021 1314   CO2 26 10/11/2016 1547   BUN 23 12/30/2021 1314   BUN 18.5 10/11/2016 1547   CREATININE 0.67 12/30/2021 1314   CREATININE 0.7 10/11/2016 1547      Component Value Date/Time   CALCIUM 9.2 12/30/2021 1314   CALCIUM 9.4 10/11/2016 1547   ALKPHOS 85 12/30/2021 1314   ALKPHOS 103 10/11/2016 1547   AST 16 12/30/2021 1314   AST 19 10/11/2016 1547   ALT 12 12/30/2021 1314   ALT 12 10/11/2016 1547   BILITOT 0.3 12/30/2021 1314   BILITOT 0.23 10/11/2016 1547       No results found for: "LABCA2"  No components found for: "MVHQIO962"  No results for input(s): "INR" in the last 168 hours.  No results found for: "LABCA2"  No results found for: "XBM841"  No results found for: "CAN125"  No results found for: "CAN153"  No results found for: "CA2729"  No components found for: "HGQUANT"  No results found for: "CEA1", "CEA" / No results found for: "CEA1", "CEA"   No results found for: "AFPTUMOR"  No results found for: "CHROMOGRNA"  No results found for: "HGBA", "HGBA2QUANT", "HGBFQUANT", "HGBSQUAN" (Hemoglobinopathy evaluation)   No results found for: "LDH"  No results found for: "IRON", "TIBC", "IRONPCTSAT" (Iron and TIBC)  No results found for: "FERRITIN"  Urinalysis    Component Value Date/Time   BILIRUBINUR n 04/04/2015 1330   PROTEINUR n 04/04/2015 1330   UROBILINOGEN negative 04/04/2015 1330   NITRITE n 04/04/2015 1330   LEUKOCYTESUR Negative 04/04/2015 1330    STUDIES: No results found.   ELIGIBLE FOR AVAILABLE RESEARCH PROTOCOL: no  ASSESSMENT: 77 y.o. [place] woman  RIGHT BREAST CANCER (1) status post right lumpectomy and margin clearance 08/10/2012 for ductal carcinoma in situ, grade 2 or 3, estrogen and progesterone receptor positive,  (2)  adjuvant radiation to the right breast completed 11/13/2012.  LEFT BREAST CANCER (3) status post left breast biopsy 05/25/2013 for sclerosing adenosis.  (4) left breast biopsy 06/09/2016 showed a clinical T1b N0, stage IA invasive ductal carcinoma (E-cadherin positive) estrogen and progesterone receptor positive, HER-2 nonamplified, with an MIB-1 of 15%.  (5) left lumpectomy and sentinel lymph node sampling 07/12/2016 showed a pT1c pN0, stage IA invasive ductal carcinoma, grade 2, with clear margins.  (6) Oncotype DX score of 21 predicted a 10 year risk of outside the breast recurrence of 13% if the patient's only systemic therapy was tamoxifen for 5 years. It also predicted no benefit from chemotherapy  (7) adjuvant radiation completed 10/08/2016  (8) started tamoxifen 11/01/2016  (a) tamoxifen discontinued AUG 2020 with hot flashes  (b) anastrozole TIW started August 2020, discontinued NOV 2020  (c) tamoxifen resumed NOV 2020 at 10 mg    PLAN:  Breast Cancer History Completed 5 years of Tamoxifen therapy. No current breast complaints. Normal mammogram. Normal breast exam today. -Continue annual mammograms and clinical breast exams.  Osteopenia -Continue Vitamin D supplementation and regular exercise.  Bowel Regularity Patient reports regular bowel movements with the use of Citrucel. -Continue current regimen.  Follow-up in December 2025 or sooner if any issues arise.  Total time spent: 20 min *Total Encounter Time as defined by the Centers for Medicare and Medicaid Services includes, in addition to the face-to-face time of a patient visit (documented in the note above) non-face-to-face time: obtaining and reviewing outside history, ordering and reviewing medications, tests or procedures, care coordination (communications  with other health care professionals or caregivers) and documentation in the medical record.

## 2023-01-19 DIAGNOSIS — M5416 Radiculopathy, lumbar region: Secondary | ICD-10-CM | POA: Diagnosis not present

## 2023-01-21 DIAGNOSIS — H6121 Impacted cerumen, right ear: Secondary | ICD-10-CM | POA: Diagnosis not present

## 2023-01-21 DIAGNOSIS — M2669 Other specified disorders of temporomandibular joint: Secondary | ICD-10-CM | POA: Diagnosis not present

## 2023-01-21 DIAGNOSIS — H93293 Other abnormal auditory perceptions, bilateral: Secondary | ICD-10-CM | POA: Diagnosis not present

## 2023-01-25 DIAGNOSIS — M5416 Radiculopathy, lumbar region: Secondary | ICD-10-CM | POA: Diagnosis not present

## 2023-02-03 DIAGNOSIS — R0982 Postnasal drip: Secondary | ICD-10-CM | POA: Diagnosis not present

## 2023-02-03 DIAGNOSIS — J342 Deviated nasal septum: Secondary | ICD-10-CM | POA: Diagnosis not present

## 2023-02-03 DIAGNOSIS — H9313 Tinnitus, bilateral: Secondary | ICD-10-CM | POA: Diagnosis not present

## 2023-02-03 DIAGNOSIS — H903 Sensorineural hearing loss, bilateral: Secondary | ICD-10-CM | POA: Diagnosis not present

## 2023-02-03 DIAGNOSIS — M26623 Arthralgia of bilateral temporomandibular joint: Secondary | ICD-10-CM | POA: Diagnosis not present

## 2023-03-23 DIAGNOSIS — L309 Dermatitis, unspecified: Secondary | ICD-10-CM | POA: Diagnosis not present

## 2023-03-23 DIAGNOSIS — D485 Neoplasm of uncertain behavior of skin: Secondary | ICD-10-CM | POA: Diagnosis not present

## 2023-03-23 DIAGNOSIS — L930 Discoid lupus erythematosus: Secondary | ICD-10-CM | POA: Diagnosis not present

## 2023-04-05 DIAGNOSIS — M5416 Radiculopathy, lumbar region: Secondary | ICD-10-CM | POA: Diagnosis not present

## 2023-04-11 DIAGNOSIS — L245 Irritant contact dermatitis due to other chemical products: Secondary | ICD-10-CM | POA: Diagnosis not present

## 2023-04-11 DIAGNOSIS — L578 Other skin changes due to chronic exposure to nonionizing radiation: Secondary | ICD-10-CM | POA: Diagnosis not present

## 2023-04-11 DIAGNOSIS — K130 Diseases of lips: Secondary | ICD-10-CM | POA: Diagnosis not present

## 2023-05-31 DIAGNOSIS — I10 Essential (primary) hypertension: Secondary | ICD-10-CM | POA: Diagnosis not present

## 2023-05-31 DIAGNOSIS — R55 Syncope and collapse: Secondary | ICD-10-CM | POA: Diagnosis not present

## 2023-05-31 DIAGNOSIS — I700 Atherosclerosis of aorta: Secondary | ICD-10-CM | POA: Diagnosis not present

## 2023-05-31 DIAGNOSIS — I5189 Other ill-defined heart diseases: Secondary | ICD-10-CM | POA: Diagnosis not present

## 2023-05-31 DIAGNOSIS — I493 Ventricular premature depolarization: Secondary | ICD-10-CM | POA: Diagnosis not present

## 2023-05-31 DIAGNOSIS — E890 Postprocedural hypothyroidism: Secondary | ICD-10-CM | POA: Diagnosis not present

## 2023-05-31 DIAGNOSIS — E785 Hyperlipidemia, unspecified: Secondary | ICD-10-CM | POA: Diagnosis not present

## 2023-05-31 DIAGNOSIS — D649 Anemia, unspecified: Secondary | ICD-10-CM | POA: Diagnosis not present

## 2023-05-31 NOTE — Telephone Encounter (Signed)
 Patient never returned call regarding refill.   Per review of EPIC, patient is scheduled for B&P exam with Jami on 06/09/23.   Routing FYI.   Encounter closed.

## 2023-06-06 ENCOUNTER — Encounter: Payer: Self-pay | Admitting: Cardiovascular Disease

## 2023-06-06 ENCOUNTER — Ambulatory Visit: Attending: Cardiovascular Disease | Admitting: Cardiovascular Disease

## 2023-06-06 ENCOUNTER — Ambulatory Visit

## 2023-06-06 VITALS — BP 184/80 | HR 57 | Ht 61.0 in | Wt 121.0 lb

## 2023-06-06 DIAGNOSIS — R002 Palpitations: Secondary | ICD-10-CM

## 2023-06-06 DIAGNOSIS — I351 Nonrheumatic aortic (valve) insufficiency: Secondary | ICD-10-CM | POA: Diagnosis not present

## 2023-06-06 DIAGNOSIS — R55 Syncope and collapse: Secondary | ICD-10-CM | POA: Diagnosis not present

## 2023-06-06 DIAGNOSIS — R011 Cardiac murmur, unspecified: Secondary | ICD-10-CM | POA: Diagnosis not present

## 2023-06-06 DIAGNOSIS — I10 Essential (primary) hypertension: Secondary | ICD-10-CM

## 2023-06-06 DIAGNOSIS — R0989 Other specified symptoms and signs involving the circulatory and respiratory systems: Secondary | ICD-10-CM

## 2023-06-06 NOTE — Progress Notes (Unsigned)
 Enrolled patient for a 14 day Zio XT  monitor to be mailed to patients home

## 2023-06-06 NOTE — Assessment & Plan Note (Signed)
 2D echo performed 10/17/2018 showed normal LV systolic function and mild to moderate AI but no evidence of aortic stenosis.  I am going to repeat a 2D echocardiogram.

## 2023-06-06 NOTE — Assessment & Plan Note (Signed)
 History of heart murmur with echo in 2020 and 2024 that revealed normal LV function and no evidence of valvular abnormalities.  She does have a soft outflow tract murmur probably related to aortic sclerosis.

## 2023-06-06 NOTE — Progress Notes (Signed)
 06/06/2023 [name] [name]   09-Aug-1945  [id number]  Primary Physician [name] Coons, MD Primary Cardiologist: [name] Leigh MD [name] [name], [place]  HPI:  [name] [name] is a 78 y.o. thin and fit appearing married Caucasian female mother of 2 sons, grandmother of 6 grandchildren referred by Dr. Nathalie [name], her PCP, for evaluation of a recent episode of syncope.  Her past medical history is fairly unremarkable.  There is no history of diabetes, hypertension or hyperlipidemia.  There is no family history of heart disease.  She is never had a heart attack or stroke.  She has had breast cancer twice with lumpectomy and radiation therapy and has remained cancer free.  She has reactive airways disease and arthritis as well.  She is fairly active, plays golf, walks and works out with a trainer 2 to 3 days a week without symptoms.  She was at a soccer game 1 week ago, on a hot day, and standing the entire time.  She had an episode of dizziness and loss of consciousness for 10 to 15 seconds and then woke up.  This may have happened 6 years ago but is extremely rare for her and is explainable by the conditions.   Current Meds  Medication Sig   cholecalciferol (VITAMIN D) 1000 UNITS tablet Take 1,000 Units by mouth 2 (two) times a week.   clobetasol  (TEMOVATE ) 0.05 % external solution APPLY TO AFFECTED AREA TWICE DAILY UP TO TWO WEEKS AS DIRECTED. TAPER USE WITH IMPROVEMENT   clobetasol  cream (TEMOVATE ) 0.05 % APPLY A SMALL AMOUNT TO THE AFFECTED AREA TWICE DAILY FOR 1-2 WEEKS   gabapentin  (NEURONTIN ) 100 MG capsule Take one cap po q am and 2 caps po at bedtime.  Needs appointment for further refills.   levothyroxine  (SYNTHROID , LEVOTHROID) 75 MCG tablet Take 75 mcg by mouth. 6 days a week take 1 pill & 1 day a week take 1/2 a pill   loratadine (CLARITIN) 5 MG chewable tablet Chew 5 mg by mouth daily. Uses as needed   Probiotic Product (PROBIOTIC PO) Take by mouth.   zolpidem  (AMBIEN ) 5 MG tablet  Take 5 mg by mouth at bedtime as needed for sleep.     Allergies  Allergen Reactions   Pneumovax 23  [Pneumococcal Vac Polyvalent] Swelling    Swollen upper arm   Effexor  [Venlafaxine ] Nausea And Vomiting   Penicillins Hives   Pneumovax [Pneumococcal Polysaccharide Vaccine] Swelling    Social History   Socioeconomic History   Marital status: Married    Spouse name: Not on file   Number of children: 2   Years of education: Not on file   Highest education level: Not on file  Occupational History   Occupation: homemaker  Tobacco Use   Smoking status: Never   Smokeless tobacco: Never  Substance and Sexual Activity   Alcohol  use: Yes    Alcohol /week: 6.0 standard drinks of alcohol     Types: 6 Standard drinks or equivalent per week   Drug use: No   Sexual activity: Not Currently    Partners: Male    Birth control/protection: Post-menopausal, Surgical    Comment: hysterectomy, hx HSV infection  Other Topics Concern   Not on file  Social History Narrative   Not on file   Social Drivers of Health   Financial Resource Strain: Not on file  Food Insecurity: Not on file  Transportation Needs: Not on file  Physical Activity: Not on file  Stress: Not on  file  Social Connections: Unknown (06/14/2021)   [facility]   Social Network    Social Network: Not on file  Intimate Partner Violence: Unknown (05/06/2021)   Received from [facility], [facility]   HITS    Physically Hurt: Not on file    Insult or Talk Down To: Not on file    Threaten Physical Harm: Not on file    Scream or Curse: Not on file     Review of Systems: General: negative for chills, fever, night sweats or weight changes.  Cardiovascular: negative for chest pain, dyspnea on exertion, edema, orthopnea, palpitations, paroxysmal nocturnal dyspnea or shortness of breath Dermatological: negative for rash Respiratory: negative for cough or wheezing Urologic: negative for  hematuria Abdominal: negative for nausea, vomiting, diarrhea, bright red blood per rectum, melena, or hematemesis Neurologic: negative for visual changes, syncope, or dizziness All other systems reviewed and are otherwise negative except as noted above.    Blood pressure (!) 184/80, pulse (!) 57, height 5\' 1"  (1.549 m), weight 121 lb (54.9 kg), last menstrual period 01/02/2000, SpO2 95%.  General appearance: alert and no distress Neck: no adenopathy, no JVD, supple, symmetrical, trachea midline, thyroid  not enlarged, symmetric, no tenderness/mass/nodules, and bilateral carotid bruits Lungs: clear to auscultation bilaterally Heart: Soft outflow tract murmur Extremities: extremities normal, atraumatic, no cyanosis or edema Pulses: 2+ and symmetric Skin: Skin color, texture, turgor normal. No rashes or lesions Neurologic: Grossly normal  EKG EKG Interpretation Date/Time:  Monday Jun 06 2023 08:34:51 EDT Ventricular Rate:  57 PR Interval:  178 QRS Duration:  88 QT Interval:  430 QTC Calculation: 418 R Axis:   -5  Text Interpretation: Sinus bradycardia Left ventricular hypertrophy with repolarization abnormality ( R in aVL ) When compared with ECG of 11-Mar-2008 14:23, No significant change was found Confirmed by [name] [name] [phone]) on 06/06/2023 8:43:56 AM    ASSESSMENT AND PLAN:   Heart murmur History of heart murmur with echo in 2020 and 2024 that revealed normal LV function and no evidence of valvular abnormalities.  She does have a soft outflow tract murmur probably related to aortic sclerosis.  Syncope and collapse Ms. Dewar was referred to me by Dr. Nathalie [name] for an episode of syncope.  This occurred 1 week ago while she was at a soccer game.  It was a hot day and she was standing the entire time.  Dr. Jesse [name] thought that this was related to dehydration.  I am going to get a 2-week Zio patch to further evaluate.  Aortic insufficiency 2D echo performed 10/17/2018 showed normal  LV systolic function and mild to moderate AI but no evidence of aortic stenosis.  I am going to repeat a 2D echocardiogram.     [name] Leigh MD [name], [facility] 06/06/2023 9:03 AM

## 2023-06-06 NOTE — Patient Instructions (Signed)
 Medication Instructions:  Your physician recommends that you continue on your current medications as directed. Please refer to the Current Medication list given to you today.  *If you need a refill on your cardiac medications before your next appointment, please call your pharmacy*   Testing/Procedures: Your physician has requested that you have an echocardiogram. Echocardiography is a painless test that uses sound waves to create images of your heart. It provides your doctor with information about the size and shape of your heart and how well your heart's chambers and valves are working. This procedure takes approximately one hour. There are no restrictions for this procedure. Please do NOT wear cologne, perfume, aftershave, or lotions (deodorant is allowed). Please arrive 15 minutes prior to your appointment time.  Please note: We ask at that you not bring children with you during ultrasound (echo/ vascular) testing. Due to room size and safety concerns, children are not allowed in the ultrasound rooms during exams. Our front office staff cannot provide observation of children in our lobby area while testing is being conducted. An adult accompanying a patient to their appointment will only be allowed in the ultrasound room at the discretion of the ultrasound technician under special circumstances. We apologize for any inconvenience.  Your physician has requested that you have a carotid duplex. This test is an ultrasound of the carotid arteries in your neck. It looks at blood flow through these arteries that supply the brain with blood. Allow one hour for this exam. There are no restrictions or special instructions. This will take place at [address], 4th floor  Please note: We ask at that you not bring children with you during ultrasound (echo/ vascular) testing. Due to room size and safety concerns, children are not allowed in the ultrasound rooms during exams. Our front office staff cannot  provide observation of children in our lobby area while testing is being conducted. An adult accompanying a patient to their appointment will only be allowed in the ultrasound room at the discretion of the ultrasound technician under special circumstances. We apologize for any inconvenience.   ZIO XT- Long Term Monitor Instructions  Your physician has requested you wear a ZIO patch monitor for 14 days.  This is a single patch monitor. [employer] supplies one patch monitor per enrollment. Additional stickers are not available. Please do not apply patch if you will be having a Nuclear Stress Test,  Echocardiogram, Cardiac CT, MRI, or Chest Xray during the period you would be wearing the  monitor. The patch cannot be worn during these tests. You cannot remove and re-apply the  ZIO XT patch monitor.  Your ZIO patch monitor will be mailed 3 day USPS to your address on file. It may take 3-5 days  to receive your monitor after you have been enrolled.  Once you have received your monitor, please review the enclosed instructions. Your monitor  has already been registered assigning a specific monitor serial # to you.  Billing and Patient Assistance Program Information  We have supplied [employer] with any of your insurance information on file for billing purposes. [employer] offers a sliding scale Patient Assistance Program for patients that do not have  insurance, or whose insurance does not completely cover the cost of the ZIO monitor.  You must apply for the Patient Assistance Program to qualify for this discounted rate.  To apply, please call [employer] at [phone], select option 4, select option 2, ask to apply for  Patient Assistance Program. Sanna Crystal will ask  your household income, and how many people  are in your household. They will quote your out-of-pocket cost based on that information.  [employer] will also be able to set up a 21-month, interest-free payment plan if needed.  Applying the monitor    Shave hair from upper left chest.  Hold abrader disc by orange tab. Rub abrader in 40 strokes over the upper left chest as  indicated in your monitor instructions.  Clean area with 4 enclosed alcohol  pads. Let dry.  Apply patch as indicated in monitor instructions. Patch will be placed under collarbone on left  side of chest with arrow pointing upward.  Rub patch adhesive wings for 2 minutes. Remove white label marked "1". Remove the white  label marked "2". Rub patch adhesive wings for 2 additional minutes.  While looking in a mirror, press and release button in center of patch. A small green light will  flash 3-4 times. This will be your only indicator that the monitor has been turned on.  Do not shower for the first 24 hours. You may shower after the first 24 hours.  Press the button if you feel a symptom. You will hear a small click. Record Date, Time and  Symptom in the Patient Logbook.  When you are ready to remove the patch, follow instructions on the last 2 pages of Patient  Logbook. Stick patch monitor onto the last page of Patient Logbook.  Place Patient Logbook in the blue and white box. Use locking tab on box and tape box closed  securely. The blue and white box has prepaid postage on it. Please place it in the mailbox as  soon as possible. Your physician should have your test results approximately 7 days after the  monitor has been mailed back to [facility].  Call [facility] Customer Care at [phone] if you have questions regarding  your ZIO XT patch monitor. Call them immediately if you see an orange light blinking on your  monitor.  If your monitor falls off in less than 4 days, contact our Monitor department at [phone].  If your monitor becomes loose or falls off after 4 days call [employer] at [phone] for  suggestions on securing your monitor   Follow-Up: At [facility], you and your health needs are our priority.  As part of our  continuing mission to provide you with exceptional heart care, our providers are all part of one team.  This team includes your primary Cardiologist (physician) and Advanced Practice Providers or APPs (Physician Assistants and Nurse Practitioners) who all work together to provide you with the care you need, when you need it.  Your next appointment:   We will see you on an as needed basis   Provider:   Lauro Portal, MD    We recommend signing up for the patient portal called "MyChart".  Sign up information is provided on this After Visit Summary.  MyChart is used to connect with patients for Virtual Visits (Telemedicine).  Patients are able to view lab/test results, encounter notes, upcoming appointments, etc.  Non-urgent messages can be sent to your provider as well.   To learn more about what you can do with MyChart, go to [email or web address].

## 2023-06-06 NOTE — Assessment & Plan Note (Signed)
 Ms. [name] [name] was referred to me by Dr. Nathalie Baize for an episode of syncope.  This occurred 1 week ago while she was at a soccer game.  It was a hot day and she was standing the entire time.  Dr. Jesse Moritz thought that this was related to dehydration.  I am going to get a 2-week Zio patch to further evaluate.

## 2023-06-09 ENCOUNTER — Ambulatory Visit (INDEPENDENT_AMBULATORY_CARE_PROVIDER_SITE_OTHER): Admitting: Radiology

## 2023-06-09 ENCOUNTER — Encounter: Payer: Self-pay | Admitting: Radiology

## 2023-06-09 ENCOUNTER — Encounter: Admitting: Radiology

## 2023-06-09 VITALS — BP 148/76 | HR 80 | Ht 61.0 in | Wt 118.8 lb

## 2023-06-09 DIAGNOSIS — N941 Unspecified dyspareunia: Secondary | ICD-10-CM | POA: Diagnosis not present

## 2023-06-09 DIAGNOSIS — N898 Other specified noninflammatory disorders of vagina: Secondary | ICD-10-CM | POA: Diagnosis not present

## 2023-06-09 DIAGNOSIS — M810 Age-related osteoporosis without current pathological fracture: Secondary | ICD-10-CM | POA: Diagnosis not present

## 2023-06-09 DIAGNOSIS — Z79810 Long term (current) use of selective estrogen receptor modulators (SERMs): Secondary | ICD-10-CM | POA: Diagnosis not present

## 2023-06-09 DIAGNOSIS — B009 Herpesviral infection, unspecified: Secondary | ICD-10-CM | POA: Diagnosis not present

## 2023-06-09 DIAGNOSIS — Z9189 Other specified personal risk factors, not elsewhere classified: Secondary | ICD-10-CM | POA: Diagnosis not present

## 2023-06-09 DIAGNOSIS — Z853 Personal history of malignant neoplasm of breast: Secondary | ICD-10-CM

## 2023-06-09 DIAGNOSIS — N951 Menopausal and female climacteric states: Secondary | ICD-10-CM | POA: Diagnosis not present

## 2023-06-09 DIAGNOSIS — Z01419 Encounter for gynecological examination (general) (routine) without abnormal findings: Secondary | ICD-10-CM

## 2023-06-09 MED ORDER — VEOZAH 45 MG PO TABS: 1.0000 | ORAL_TABLET | Freq: Every day | ORAL | 0 refills | Status: DC

## 2023-06-09 MED ORDER — IMVEXXY MAINTENANCE PACK 10 MCG VA INST: 1.0000 | VAGINAL_INSERT | VAGINAL | 11 refills | Status: DC

## 2023-06-09 NOTE — Progress Notes (Addendum)
 [name] [name] 03-Sep-1945 [id number]   History:  78 y.o. G2P2 presents for annual exam.Hx of bilateral breast cancer, has been off Tamoxifen  x 2 years. C/o hot flashes, little improvement with gabapentin . Vaginal dryness with dyspareunia (when she was sexually active, husband has Parkinson's). Would like to try another option to manage her symptoms. Hx of HSV, no recent outbreaks.  Gynecologic History Hysterectomy 2022 fibroids  OB History     Gravida  2   Para  2   Term      Preterm      AB      Living  2      SAB      IAB      Ectopic      Multiple      Live Births  2            Health Maintenance Last Pap: 2019. Results were: normal paps d/c'd Last mammogram: 07/2022. Results were: normal Last colonoscopy: hemoccult 2024 per pt neg  Last Dexa: 2023. Results were: osteopenia with increased risk of fx T score -2.2   Risk Factors for Medicare Patients >/= 5 sexual partners in a lifetime: No First intercourse <48 years of age: No H/O STD at any age: Yes Abnormal pap smear, < 3 negative paps within the last 7 years: No DES exposure (women born between [id number]): No Patient is on post breast cancer medication like Femara or, if medication like this is not needed, 5 years post breast cancer diagnosis: No      11/24/2016   11:28 AM 09/01/2016    1:05 PM  Depression screen PHQ 2/9  Decreased Interest 0 0  Down, Depressed, Hopeless 0 0  PHQ - 2 Score 0 0     Past medical history, past surgical history, family history and social history were all reviewed and documented in the EPIC chart.  ROS:  A ROS was performed and pertinent positives and negatives are included.  Exam:  Vitals:   06/09/23 1332 06/09/23 1342  BP: (!) 156/82 (!) 148/76  Pulse: 80   SpO2: 94%   Weight: 118 lb 12.8 oz (53.9 kg)   Height: 5\' 1"  (1.549 m)    Body mass index is 22.45 kg/m.  General appearance:  Normal Respiratory  Auscultation:  Clear without wheezing or  rhonchi Cardiovascular  Auscultation:  Regular rate, without rubs, murmurs or gallops  Edema/varicosities:  Not grossly evident Abdominal  Soft,nontender, without masses, guarding or rebound.  Liver/spleen:  No organomegaly noted  Hernia:  None appreciated  Skin  Inspection:  Grossly normal Breasts: Examined lying and sitting.   Right: Without masses, retractions, nipple discharge or axillary adenopathy.   Left: Without masses, retractions, nipple discharge or axillary adenopathy. Genitourinary   Inguinal/mons:  Normal without inguinal adenopathy  External genitalia:  Normal appearing vulva with no masses, tenderness, or lesions  BUS/Urethra/Skene's glands:  Normal  Vagina:  Normal appearing with normal color and discharge, no lesions. Atrophy moderate  Cervix:  no lesions or discharge  Uterus:  absent  Adnexa/parametria:     Rt: Normal in size, without masses or tenderness.   Lt: Normal in size, without masses or tenderness.  Anus and perineum: Normal   Ellis Guys, CMA present for exam  Assessment/Plan:   1. Encounter for breast and pelvic examination (Primary) Return 1 year for exam  2. Vasomotor symptoms due to menopause - Hepatic function panel. Repeat in 3 months after starting. Sample pack given - Fezolinetant  (VEOZAH )  45 MG TABS; Take 1 tablet (45 mg total) by mouth daily.  Dispense: 90 tablet; Refill: 0  3. Dyspareunia in female - Prasterone (INTRAROSA) 6.5 MG INST; Place 1 Insert vaginally at bedtime.  Dispense: 30 each; Refill: 6  4. Vaginal dryness  5. Age-related osteoporosis without current pathological fracture Discussed Prolia or evenity to build bone. Will discuss with PCP who manges her bone health. Due for DEXA 12/25   Return in 1 year for annual or sooner prn.  Ahlani Wickes B WHNP-BC 2:15 PM 06/09/2023

## 2023-06-10 LAB — HEPATIC FUNCTION PANEL
AG Ratio: 1.4 (calc) (ref 1.0–2.5)
ALT: 10 U/L (ref 6–29)
AST: 13 U/L (ref 10–35)
Albumin: 4.2 g/dL (ref 3.6–5.1)
Alkaline phosphatase (APISO): 98 U/L (ref 37–153)
Bilirubin, Direct: 0.1 mg/dL (ref 0.0–0.2)
Globulin: 3.1 g/dL (ref 1.9–3.7)
Indirect Bilirubin: 0.2 mg/dL (ref 0.2–1.2)
Total Bilirubin: 0.3 mg/dL (ref 0.2–1.2)
Total Protein: 7.3 g/dL (ref 6.1–8.1)

## 2023-06-10 MED ORDER — INTRAROSA 6.5 MG VA INST: 1.0000 | VAGINAL_INSERT | Freq: Every evening | VAGINAL | 6 refills | Status: AC

## 2023-06-10 NOTE — Addendum Note (Signed)
 Addended by: Teona Vargus on: 06/10/2023 02:38 PM   Modules accepted: Orders

## 2023-06-13 ENCOUNTER — Encounter: Payer: Self-pay | Admitting: Hematology and Oncology

## 2023-06-13 DIAGNOSIS — L309 Dermatitis, unspecified: Secondary | ICD-10-CM | POA: Diagnosis not present

## 2023-06-13 DIAGNOSIS — L718 Other rosacea: Secondary | ICD-10-CM | POA: Diagnosis not present

## 2023-06-13 DIAGNOSIS — L308 Other specified dermatitis: Secondary | ICD-10-CM | POA: Diagnosis not present

## 2023-06-13 DIAGNOSIS — K130 Diseases of lips: Secondary | ICD-10-CM | POA: Diagnosis not present

## 2023-06-13 DIAGNOSIS — L570 Actinic keratosis: Secondary | ICD-10-CM | POA: Diagnosis not present

## 2023-06-14 ENCOUNTER — Telehealth: Payer: Self-pay

## 2023-06-14 NOTE — Telephone Encounter (Signed)
 Message received on Triage line from [place] at [facility]. PA for Intrarosa approved, valid 06/14/23 -06/13/24, will also be sending fax notification.

## 2023-06-14 NOTE — Telephone Encounter (Signed)
 PA for Intrarosa approved.  Patient notified.

## 2023-06-14 NOTE — Telephone Encounter (Signed)
 Prior authorization request received from covermymeds for Intrarosa.  PA initiated.  Patient notified. KEY: [id number]: [id number]

## 2023-06-17 ENCOUNTER — Ambulatory Visit (HOSPITAL_COMMUNITY)
Admission: RE | Admit: 2023-06-17 | Discharge: 2023-06-17 | Disposition: A | Discharge status: Home / Self Care | Source: Ambulatory Visit | Attending: Internal Medicine | Admitting: Internal Medicine

## 2023-06-17 DIAGNOSIS — I10 Essential (primary) hypertension: Secondary | ICD-10-CM | POA: Diagnosis not present

## 2023-06-17 DIAGNOSIS — R55 Syncope and collapse: Secondary | ICD-10-CM | POA: Diagnosis not present

## 2023-06-17 DIAGNOSIS — I351 Nonrheumatic aortic (valve) insufficiency: Secondary | ICD-10-CM | POA: Insufficient documentation

## 2023-06-17 DIAGNOSIS — R011 Cardiac murmur, unspecified: Secondary | ICD-10-CM | POA: Insufficient documentation

## 2023-06-18 ENCOUNTER — Ambulatory Visit: Payer: Self-pay | Admitting: Cardiovascular Disease

## 2023-06-18 LAB — ECHOCARDIOGRAM COMPLETE
AR max vel: 1.39 cm2
AV Area VTI: 1.6 cm2
AV Area mean vel: 1.42 cm2
AV Mean grad: 8.3 mmHg
AV Peak grad: 16.8 mmHg
Ao pk vel: 2.05 m/s
Area-P 1/2: 3.02 cm2
S' Lateral: 2.74 cm

## 2023-06-20 ENCOUNTER — Other Ambulatory Visit: Payer: Self-pay | Admitting: Endocrinology

## 2023-06-20 ENCOUNTER — Encounter: Admitting: Obstetrics and Gynecology

## 2023-06-20 DIAGNOSIS — Z1231 Encounter for screening mammogram for malignant neoplasm of breast: Secondary | ICD-10-CM

## 2023-06-21 ENCOUNTER — Other Ambulatory Visit: Payer: Self-pay | Admitting: *Deleted

## 2023-06-24 ENCOUNTER — Ambulatory Visit (HOSPITAL_COMMUNITY)
Admission: RE | Admit: 2023-06-24 | Discharge: 2023-06-24 | Disposition: A | Discharge status: Home / Self Care | Source: Ambulatory Visit | Attending: Cardiovascular Disease | Admitting: Cardiovascular Disease

## 2023-06-24 DIAGNOSIS — R0989 Other specified symptoms and signs involving the circulatory and respiratory systems: Secondary | ICD-10-CM

## 2023-06-24 IMAGING — MG DIGITAL DIAGNOSTIC BILAT W/ TOMO W/ CAD
8 series · 8 of 24 positions shown · non-contrast
Comparison: Previous exam(s).

CLINICAL DATA: Patient reports an episode of right breast pain a
few weeks ago, since resolved. She has a history of bilateral
lumpectomies for breast carcinoma, treated with adjuvant radiation
therapy. She has also had a relatively recent right shoulder
replacement and is undergoing physical therapy.

EXAM:
DIGITAL DIAGNOSTIC BILATERAL MAMMOGRAM WITH TOMOSYNTHESIS AND CAD
TECHNIQUE: Bilateral digital diagnostic mammography and breast tomosynthesis
was performed. The images were evaluated with computer-aided
detection.

[L CC synth-2D]
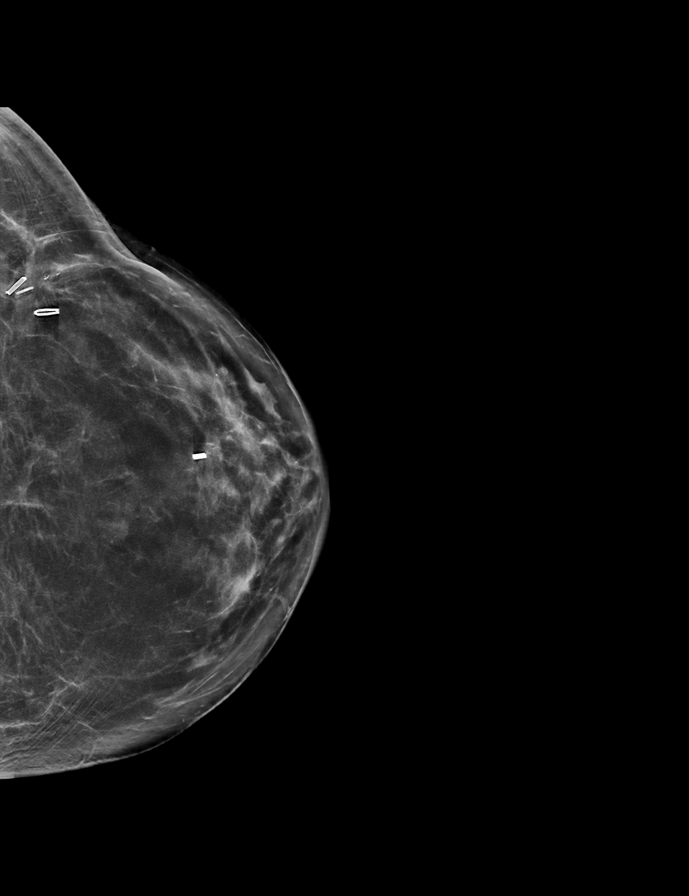

[R MLO synth-2D]
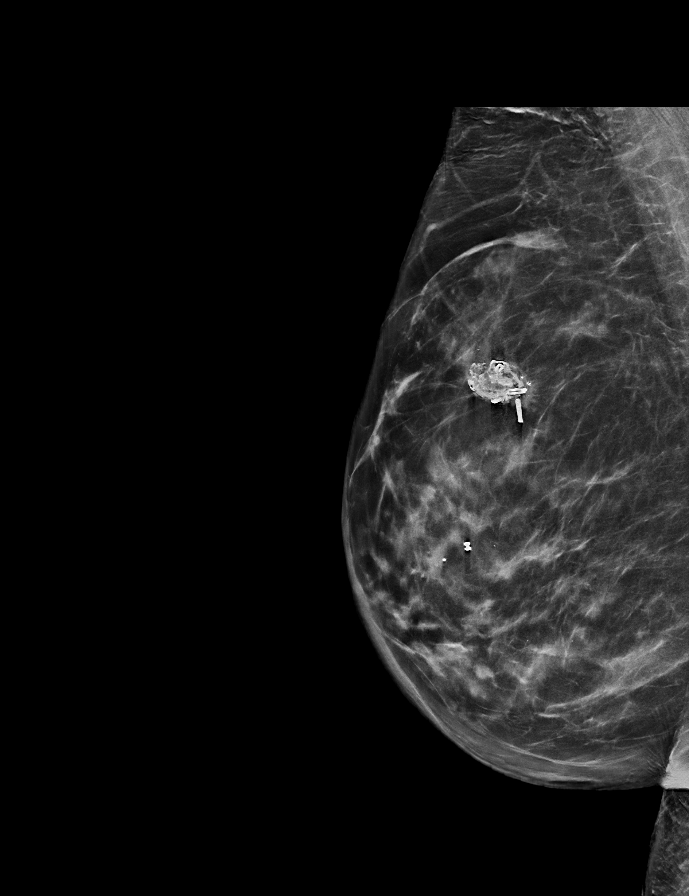

[L MLO synth-2D]
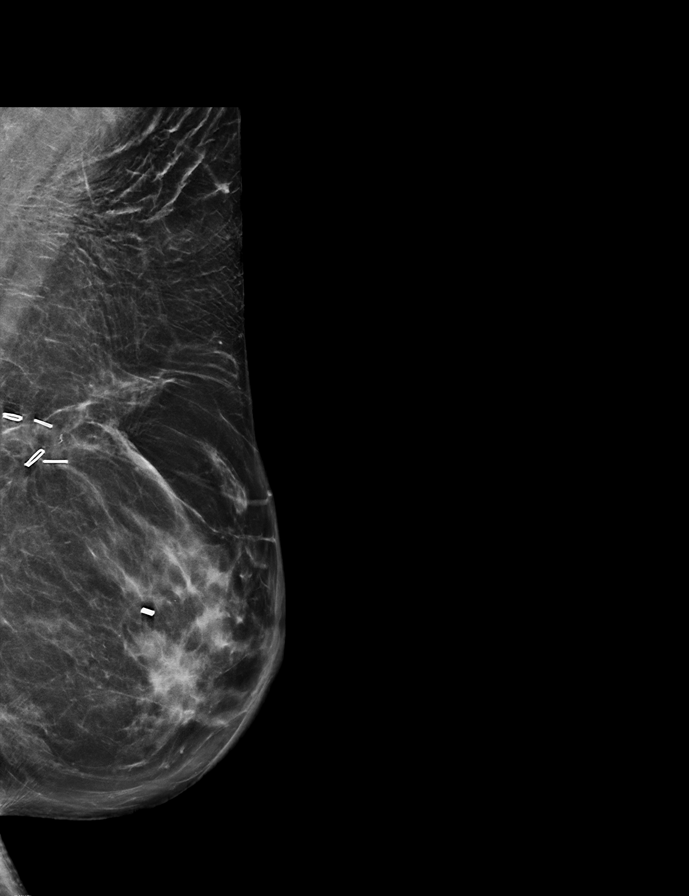

[R CC synth-2D]
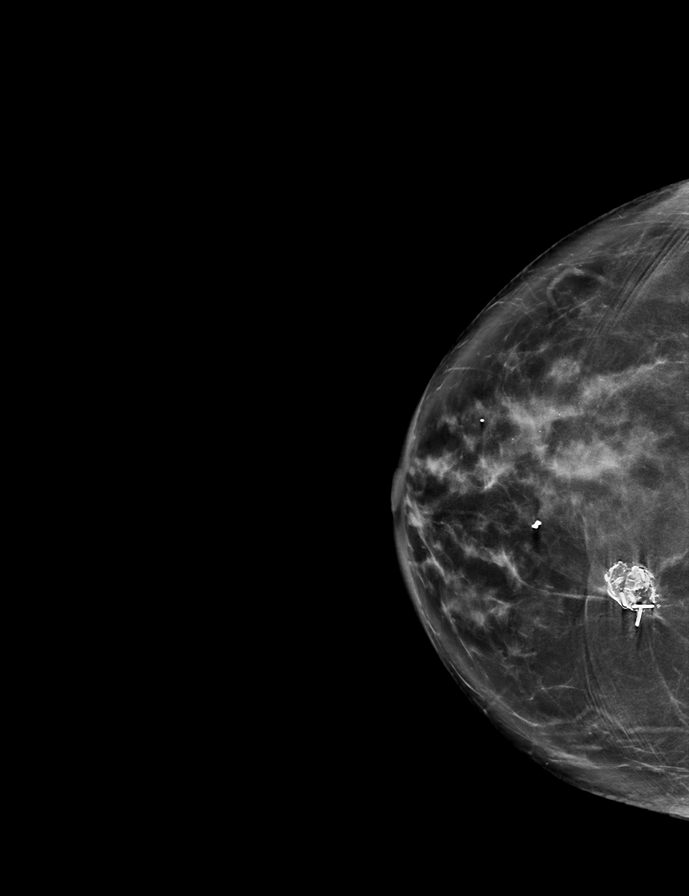

[L MLO tomo · tomo slice 37/74.0]
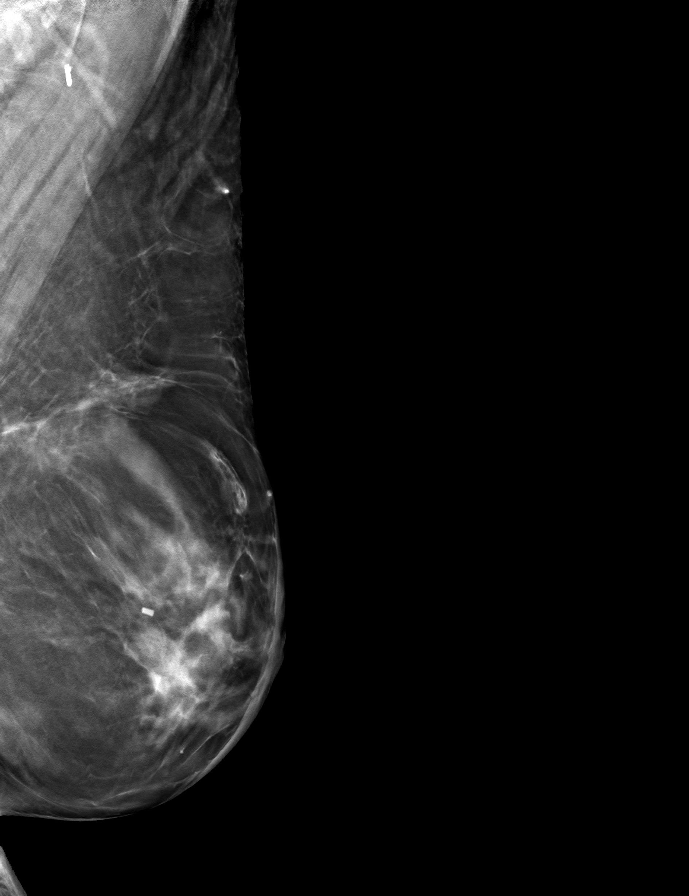

[L CC tomo · tomo slice 35/69.0]
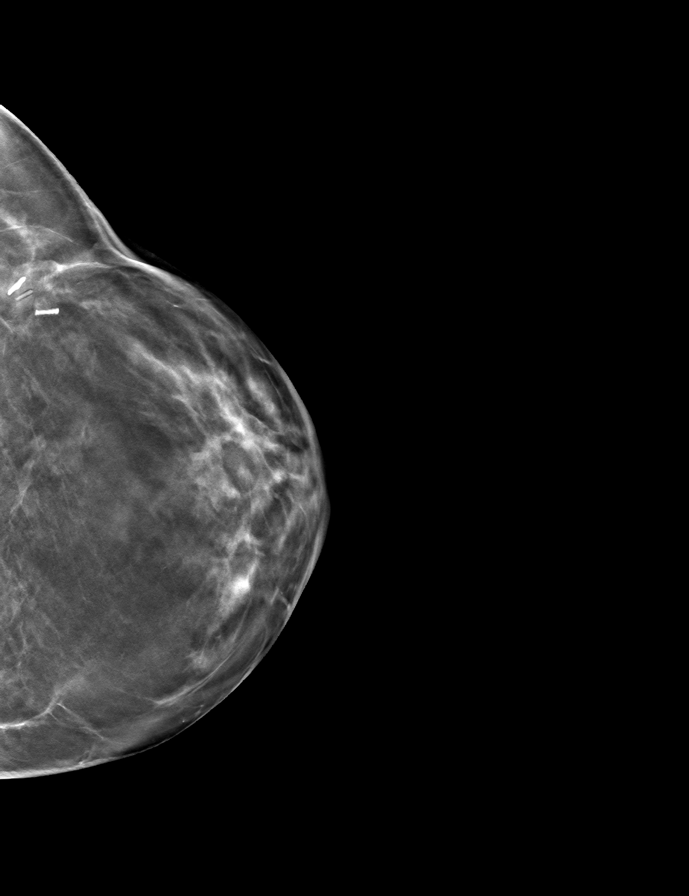

[R CC tomo · tomo slice 37/72.0]
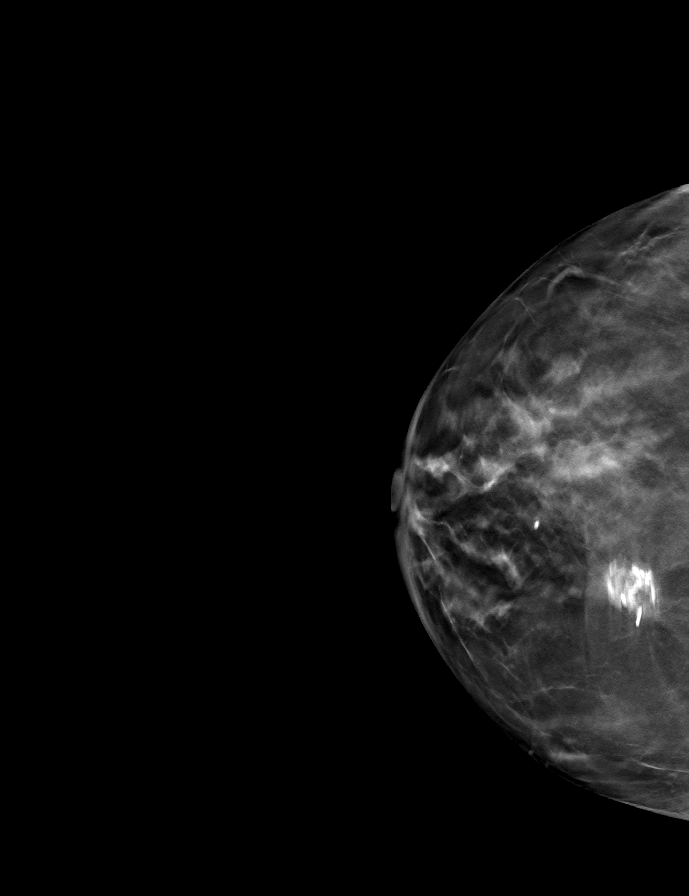

[R MLO tomo · tomo slice 37/72.0]
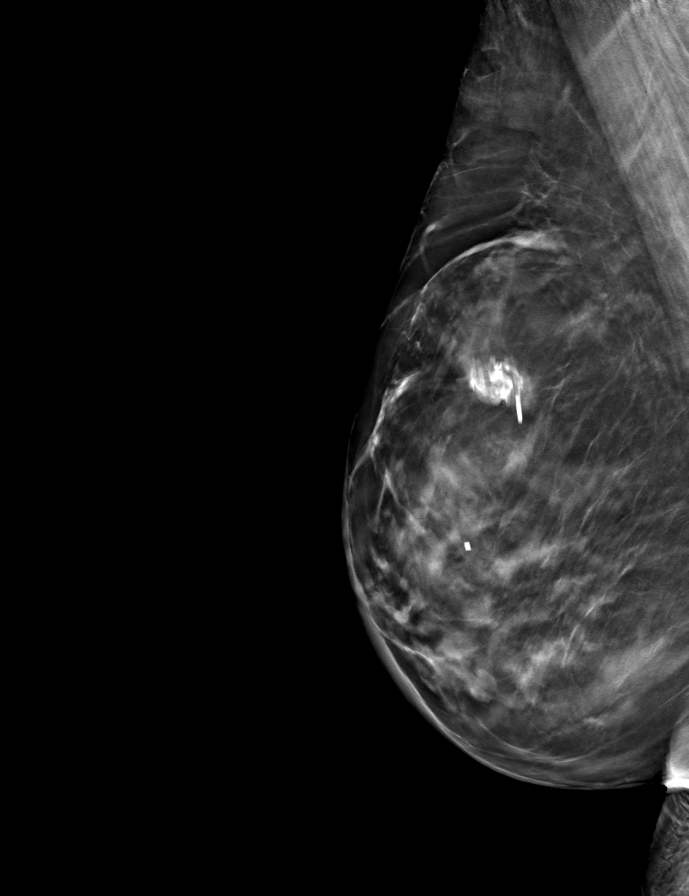

[8 of 24 positions shown; findings below may reference images not displayed]

ACR Breast Density Category c: The breast tissue is heterogeneously
dense, which may obscure small masses.
FINDINGS: There are no masses. Postsurgical changes in the upper inner right
breast and posterolateral left breast are stable. There are no areas
of nonsurgical architectural distortion. There are no suspicious
calcifications. No mammographic change.
IMPRESSION: 1. No evidence of new or recurrent breast carcinoma.
2. Benign bilateral post lumpectomy changes.

RECOMMENDATION:
Screening mammogram in one year.(Code:QL-[id number])

I have discussed the findings and recommendations with the patient.
If applicable, a reminder letter will be sent to the patient
regarding the next appointment.

BI-RADS CATEGORY  2: Benign.

## 2023-07-01 NOTE — Telephone Encounter (Signed)
 Pt also LVM in triage line re: how to get rxs sent to her.

## 2023-07-05 ENCOUNTER — Telehealth: Payer: Self-pay

## 2023-07-05 ENCOUNTER — Other Ambulatory Visit: Payer: Self-pay | Admitting: Radiology

## 2023-07-05 DIAGNOSIS — N951 Menopausal and female climacteric states: Secondary | ICD-10-CM

## 2023-07-05 MED ORDER — VEOZAH 45 MG PO TABS
1.0000 | ORAL_TABLET | Freq: Every day | ORAL | 0 refills | Status: AC
Start: 2023-07-05 — End: ?

## 2023-07-05 NOTE — Telephone Encounter (Signed)
 Fax received from [facility] phone [phone], fax [phone] for Veozah  prior authorization.  Patient ID# [id number]. Attempted PA through covermymeds and no coverage was found with this member ID#.  I spoke to the patient to verify her prescription plan, ID, group, RX BIN numbers.  Patient will check on this information and call or message back.

## 2023-07-06 NOTE — Telephone Encounter (Signed)
 Patient is aware if she uses mail order pharmacy instead of [facility] cost will be $0.  Patient still unsure if she wants to use mail order.

## 2023-07-07 ENCOUNTER — Telehealth: Payer: Self-pay

## 2023-07-07 NOTE — Telephone Encounter (Signed)
 Patient notified PA for Veozah  was approved.

## 2023-07-07 NOTE — Telephone Encounter (Signed)
 Prior authorization request received from covermymeds for Veozah .  PA initiated.  Patient notified. KEY: [id number]: [id number]

## 2023-07-07 NOTE — Telephone Encounter (Signed)
 [name] [name] with [facility] LVM in triage line stating that her PA for Veozah  has been approved fro coverage from 07/07/2023-07/06/2024-information to be faxed over as well.

## 2023-07-19 ENCOUNTER — Other Ambulatory Visit: Payer: Self-pay

## 2023-07-19 DIAGNOSIS — N951 Menopausal and female climacteric states: Secondary | ICD-10-CM

## 2023-07-19 MED ORDER — VEOZAH 45 MG PO TABS: 1.0000 | ORAL_TABLET | Freq: Every day | ORAL | 0 refills | Status: DC

## 2023-07-19 NOTE — Telephone Encounter (Signed)
 Veozah  sent to [facility] per [facility] NP.

## 2023-07-19 NOTE — Telephone Encounter (Signed)
 Please send to [facility]

## 2023-07-22 ENCOUNTER — Ambulatory Visit

## 2023-07-26 DIAGNOSIS — L578 Other skin changes due to chronic exposure to nonionizing radiation: Secondary | ICD-10-CM | POA: Diagnosis not present

## 2023-07-26 DIAGNOSIS — L814 Other melanin hyperpigmentation: Secondary | ICD-10-CM | POA: Diagnosis not present

## 2023-07-26 DIAGNOSIS — D485 Neoplasm of uncertain behavior of skin: Secondary | ICD-10-CM | POA: Diagnosis not present

## 2023-07-26 DIAGNOSIS — D225 Melanocytic nevi of trunk: Secondary | ICD-10-CM | POA: Diagnosis not present

## 2023-07-26 DIAGNOSIS — L718 Other rosacea: Secondary | ICD-10-CM | POA: Diagnosis not present

## 2023-07-28 DIAGNOSIS — N390 Urinary tract infection, site not specified: Secondary | ICD-10-CM | POA: Diagnosis not present

## 2023-08-08 DIAGNOSIS — M2550 Pain in unspecified joint: Secondary | ICD-10-CM | POA: Diagnosis not present

## 2023-08-08 DIAGNOSIS — R5383 Other fatigue: Secondary | ICD-10-CM | POA: Diagnosis not present

## 2023-08-08 DIAGNOSIS — I10 Essential (primary) hypertension: Secondary | ICD-10-CM | POA: Diagnosis not present

## 2023-08-08 DIAGNOSIS — M5416 Radiculopathy, lumbar region: Secondary | ICD-10-CM | POA: Diagnosis not present

## 2023-08-08 DIAGNOSIS — M8580 Other specified disorders of bone density and structure, unspecified site: Secondary | ICD-10-CM | POA: Diagnosis not present

## 2023-08-08 DIAGNOSIS — E890 Postprocedural hypothyroidism: Secondary | ICD-10-CM | POA: Diagnosis not present

## 2023-08-08 DIAGNOSIS — M25552 Pain in left hip: Secondary | ICD-10-CM | POA: Diagnosis not present

## 2023-08-08 DIAGNOSIS — M25551 Pain in right hip: Secondary | ICD-10-CM | POA: Diagnosis not present

## 2023-08-09 ENCOUNTER — Ambulatory Visit (INDEPENDENT_AMBULATORY_CARE_PROVIDER_SITE_OTHER): Admitting: Radiology

## 2023-08-09 ENCOUNTER — Encounter: Payer: Self-pay | Admitting: Radiology

## 2023-08-09 ENCOUNTER — Ambulatory Visit
Admission: RE | Admit: 2023-08-09 | Discharge: 2023-08-09 | Disposition: A | Discharge status: Home / Self Care | Source: Ambulatory Visit | Attending: Endocrinology | Admitting: Endocrinology

## 2023-08-09 VITALS — BP 138/70 | HR 61 | Wt 122.4 lb

## 2023-08-09 DIAGNOSIS — Z1231 Encounter for screening mammogram for malignant neoplasm of breast: Secondary | ICD-10-CM | POA: Diagnosis not present

## 2023-08-09 DIAGNOSIS — N898 Other specified noninflammatory disorders of vagina: Secondary | ICD-10-CM | POA: Diagnosis not present

## 2023-08-09 LAB — WET PREP FOR TRICH, YEAST, CLUE

## 2023-08-09 NOTE — Progress Notes (Signed)
      Subjective: [name] [name] is a 78 y.o. female who complains of vaginal dryness and possible yeast infection. Was prescribed intrarosa  but has not started because she left it at her other home.    Review of Systems  All other systems reviewed and are negative.   Past Medical History:  Diagnosis Date   Acute asthmatic bronchitis    Asthma, mild intermittent    Breast cancer (HCC) 08/2012   right-DCIS, +ER/PR   Cystocele    Grade 2   Diverticulosis 2004   Fibroids 1998   Fracture of fibula 2007   left;  hit with golf ball   Graves' disease    Heart murmur    nl echo   History of radiation therapy 09/16/2016- 10/08/2016   Left Breast. 42.56 Gy in 16 fractions.    History of TMJ disorder    HLD (hyperlipidemia)    HSV infection    Hx of radiation therapy 10/11/12- 11/13/12   right breast 4800 cGy 24 sessions   Hypertension    Hypothyroidism    Left ventricular diastolic dysfunction    Osteopenia 2002   Personal history of radiation therapy 2014   right br   Personal history of radiation therapy 2018   left br   Raynaud's disease    Shingles    Syncope    Vasomotor rhinitis    VMR (vasomotor rhinitis)       Objective:  Today's Vitals   08/09/23 0932  BP: 138/70  Pulse: 61  SpO2: 97%  Weight: 122 lb 6.4 oz (55.5 kg)   Body mass index is 23.13 kg/m.   Physical Exam Vitals and nursing note reviewed. Exam conducted with a chaperone present.  Constitutional:      Appearance: Normal appearance. She is well-developed.  Pulmonary:     Effort: Pulmonary effort is normal.  Abdominal:     General: Abdomen is flat.     Palpations: Abdomen is soft.  Genitourinary:    General: Normal vulva.     Vagina: Erythema present. No vaginal discharge, bleeding or lesions.     Cervix: Normal. No discharge, friability, lesion or erythema.     Uterus: Normal.      Adnexa: Right adnexa normal and left adnexa normal.     Comments: Moderately atrophic Neurological:      Mental Status: She is alert.  Psychiatric:        Mood and Affect: Mood normal.        Thought Content: Thought content normal.        Judgment: Judgment normal.      Microscopic wet-mount exam shows negative for pathogens, normal epithelial cells.   Darice Hoit, CMA present for exam  Assessment:/Plan:   1. Vaginal itching (Primary) + atrophy, will start Intrarosa  tonight Reassured negative wet prep - WET PREP FOR TRICH, YEAST, CLUE     Darsh Vandevoort B, NP 10:14 AM

## 2023-08-11 DIAGNOSIS — R55 Syncope and collapse: Secondary | ICD-10-CM | POA: Diagnosis not present

## 2023-08-11 DIAGNOSIS — R002 Palpitations: Secondary | ICD-10-CM | POA: Diagnosis not present

## 2023-08-12 ENCOUNTER — Encounter: Payer: Self-pay | Admitting: Cardiovascular Disease

## 2023-08-14 DIAGNOSIS — R002 Palpitations: Secondary | ICD-10-CM

## 2023-08-14 DIAGNOSIS — R55 Syncope and collapse: Secondary | ICD-10-CM

## 2023-09-05 DIAGNOSIS — M5416 Radiculopathy, lumbar region: Secondary | ICD-10-CM | POA: Diagnosis not present

## 2023-09-06 ENCOUNTER — Ambulatory Visit: Attending: Cardiovascular Disease | Admitting: Cardiovascular Disease

## 2023-09-06 ENCOUNTER — Encounter: Payer: Self-pay | Admitting: Cardiovascular Disease

## 2023-09-06 VITALS — BP 150/72 | HR 60 | Ht 62.0 in | Wt 124.6 lb

## 2023-09-06 DIAGNOSIS — E785 Hyperlipidemia, unspecified: Secondary | ICD-10-CM | POA: Diagnosis not present

## 2023-09-06 DIAGNOSIS — M8580 Other specified disorders of bone density and structure, unspecified site: Secondary | ICD-10-CM | POA: Diagnosis not present

## 2023-09-06 DIAGNOSIS — E890 Postprocedural hypothyroidism: Secondary | ICD-10-CM | POA: Diagnosis not present

## 2023-09-06 DIAGNOSIS — R55 Syncope and collapse: Secondary | ICD-10-CM | POA: Insufficient documentation

## 2023-09-06 DIAGNOSIS — M109 Gout, unspecified: Secondary | ICD-10-CM | POA: Diagnosis not present

## 2023-09-06 DIAGNOSIS — E538 Deficiency of other specified B group vitamins: Secondary | ICD-10-CM | POA: Diagnosis not present

## 2023-09-06 DIAGNOSIS — I119 Hypertensive heart disease without heart failure: Secondary | ICD-10-CM | POA: Diagnosis not present

## 2023-09-06 DIAGNOSIS — D649 Anemia, unspecified: Secondary | ICD-10-CM | POA: Diagnosis not present

## 2023-09-06 NOTE — Patient Instructions (Signed)
 Medication Instructions:  Your physician recommends that you continue on your current medications as directed. Please refer to the Current Medication list given to you today.  *If you need a refill on your cardiac medications before your next appointment, please call your pharmacy*  Follow-Up: At [facility], you and your health needs are our priority.  As part of our continuing mission to provide you with exceptional heart care, our providers are all part of one team.  This team includes your primary Cardiologist (physician) and Advanced Practice Providers or APPs (Physician Assistants and Nurse Practitioners) who all work together to provide you with the care you need, when you need it.  Your next appointment:   6 month(s)  Provider:   Lauro Portal, MD    We recommend signing up for the patient portal called "MyChart".  Sign up information is provided on this After Visit Summary.  MyChart is used to connect with patients for Virtual Visits (Telemedicine).  Patients are able to view lab/test results, encounter notes, upcoming appointments, etc.  Non-urgent messages can be sent to your provider as well.   To learn more about what you can do with MyChart, go to [email or web address].

## 2023-09-06 NOTE — Progress Notes (Signed)
 [name] [name] returns today to discuss her noninvasive test performed in the evaluation of syncope.  She is accompanied by her husband [name] today.  She had carotid Dopplers which were normal.  2D echo revealed normal LV systolic function with moderate asymmetric left ventricular hypertrophy and grade 1 diastolic dysfunction.  She had mild MR.  An event monitor showed short runs of SVT the longest of which was 20 beats.  If she has a recurrent episode she will need to have a loop recorder implanted.  I think her episode was related to dehydration.  I have however counseled her about not driving for 6 months.  I will see her back in 6 months for follow-up.  She if she has had no recurrent episodes I will see her back as needed.  Dorn DOROTHA Lesches, M.D., FACP, [facility], FAHA, [facility]  [address] [place], [place]  [address]  [phone] 09/06/2023 2:11 PM

## 2023-09-09 ENCOUNTER — Ambulatory Visit

## 2023-09-13 DIAGNOSIS — R262 Difficulty in walking, not elsewhere classified: Secondary | ICD-10-CM | POA: Diagnosis not present

## 2023-09-13 DIAGNOSIS — M6281 Muscle weakness (generalized): Secondary | ICD-10-CM | POA: Diagnosis not present

## 2023-09-13 DIAGNOSIS — M545 Low back pain, unspecified: Secondary | ICD-10-CM | POA: Diagnosis not present

## 2023-09-20 DIAGNOSIS — E890 Postprocedural hypothyroidism: Secondary | ICD-10-CM | POA: Diagnosis not present

## 2023-09-20 DIAGNOSIS — I5189 Other ill-defined heart diseases: Secondary | ICD-10-CM | POA: Diagnosis not present

## 2023-09-20 DIAGNOSIS — I10 Essential (primary) hypertension: Secondary | ICD-10-CM | POA: Diagnosis not present

## 2023-09-20 DIAGNOSIS — Z1331 Encounter for screening for depression: Secondary | ICD-10-CM | POA: Diagnosis not present

## 2023-09-20 DIAGNOSIS — M5416 Radiculopathy, lumbar region: Secondary | ICD-10-CM | POA: Diagnosis not present

## 2023-09-20 DIAGNOSIS — L930 Discoid lupus erythematosus: Secondary | ICD-10-CM | POA: Diagnosis not present

## 2023-09-20 DIAGNOSIS — M8580 Other specified disorders of bone density and structure, unspecified site: Secondary | ICD-10-CM | POA: Diagnosis not present

## 2023-09-20 DIAGNOSIS — R82998 Other abnormal findings in urine: Secondary | ICD-10-CM | POA: Diagnosis not present

## 2023-09-20 DIAGNOSIS — G629 Polyneuropathy, unspecified: Secondary | ICD-10-CM | POA: Diagnosis not present

## 2023-09-20 DIAGNOSIS — Z0000 Encounter for general adult medical examination without abnormal findings: Secondary | ICD-10-CM | POA: Diagnosis not present

## 2023-09-20 DIAGNOSIS — R55 Syncope and collapse: Secondary | ICD-10-CM | POA: Diagnosis not present

## 2023-09-20 DIAGNOSIS — Z853 Personal history of malignant neoplasm of breast: Secondary | ICD-10-CM | POA: Diagnosis not present

## 2023-09-20 DIAGNOSIS — Z1389 Encounter for screening for other disorder: Secondary | ICD-10-CM | POA: Diagnosis not present

## 2023-09-20 DIAGNOSIS — M109 Gout, unspecified: Secondary | ICD-10-CM | POA: Diagnosis not present

## 2023-09-20 DIAGNOSIS — E785 Hyperlipidemia, unspecified: Secondary | ICD-10-CM | POA: Diagnosis not present

## 2023-09-22 DIAGNOSIS — H04123 Dry eye syndrome of bilateral lacrimal glands: Secondary | ICD-10-CM | POA: Diagnosis not present

## 2023-09-26 DIAGNOSIS — N951 Menopausal and female climacteric states: Secondary | ICD-10-CM

## 2023-09-26 MED ORDER — VEOZAH 45 MG PO TABS: 1.0000 | ORAL_TABLET | Freq: Every day | ORAL | 0 refills | Status: AC

## 2023-09-26 NOTE — Telephone Encounter (Signed)
 Rx updated to [facility] and pended.   Per review of 08/11/23 -patient did not proceed due to [facility] cost/ins coverage  Routing to Jami.

## 2023-10-05 DIAGNOSIS — Z1211 Encounter for screening for malignant neoplasm of colon: Secondary | ICD-10-CM | POA: Diagnosis not present

## 2023-10-05 DIAGNOSIS — Z1212 Encounter for screening for malignant neoplasm of rectum: Secondary | ICD-10-CM | POA: Diagnosis not present

## 2023-10-11 DIAGNOSIS — R5383 Other fatigue: Secondary | ICD-10-CM | POA: Diagnosis not present

## 2023-10-11 DIAGNOSIS — E538 Deficiency of other specified B group vitamins: Secondary | ICD-10-CM | POA: Diagnosis not present

## 2023-10-11 DIAGNOSIS — M5416 Radiculopathy, lumbar region: Secondary | ICD-10-CM | POA: Diagnosis not present

## 2023-10-13 DIAGNOSIS — M5416 Radiculopathy, lumbar region: Secondary | ICD-10-CM | POA: Diagnosis not present

## 2023-10-13 LAB — COLOGUARD: COLOGUARD: POSITIVE — AB

## 2023-10-20 DIAGNOSIS — M5416 Radiculopathy, lumbar region: Secondary | ICD-10-CM | POA: Diagnosis not present

## 2023-10-25 DIAGNOSIS — H02831 Dermatochalasis of right upper eyelid: Secondary | ICD-10-CM | POA: Diagnosis not present

## 2023-10-25 DIAGNOSIS — H02423 Myogenic ptosis of bilateral eyelids: Secondary | ICD-10-CM | POA: Diagnosis not present

## 2023-10-25 DIAGNOSIS — L988 Other specified disorders of the skin and subcutaneous tissue: Secondary | ICD-10-CM | POA: Diagnosis not present

## 2023-10-25 DIAGNOSIS — R238 Other skin changes: Secondary | ICD-10-CM | POA: Diagnosis not present

## 2023-10-25 DIAGNOSIS — H02834 Dermatochalasis of left upper eyelid: Secondary | ICD-10-CM | POA: Diagnosis not present

## 2023-10-25 NOTE — Progress Notes (Signed)
 The Chief Complaint, HPI, ROS, and PFSHx as documented were reviewed with the patient by the physician and physician extender and updated as appropriate.  A/P 78 y/o F  Referred by Dr. 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  - lids obstructing superior and temporal visual field when patient tries to read, drive, having fatigue from eyelid/brow straining - Photos and GVF reviewed > 30% improvement - MRD1 OU < 2mm: MRD 1 OD 1 MRD 1 OS 2  - GVF and photos today demonstrating ptosis and improvement in VF with taping - external photos taken today  - Natural history and pathophysiology of disease discussed with patient. - Surgical options discussed including Bilateral conj-mullerectomy 4mm OD and 3mm OS, w or w/o cosmetic upper lid blepharoplasty with fat pad removal, with or w/o laser skin resurfacing of the lower lids as well as observation. - Risks, benefits, and alternatives to surgery, including bleeding, infection, scarring, wound dehiscence, poor cosmetic result, need for more surgery, and loss of vision discussed with patient. - Patient expresses understanding and elects to proceed with surgery in the OR. - Would benefit from Bilateral Upper Lid Conj-Mullerectomy 4mm OD/ 3mm OS, COSMETIC Bilateral upper lid blepharoplasty with nasal fat pad removal, w or w/o laser skin resurfacing to lower lids - Patient expresses understanding that there will be a charge for cosmetic enhancements during the post-operative period. -  has been using Upneeq and thinks with this her lids are more equal  - PMHx: DES, previous Botox in May 2025, s/p total thyroidectomy, s/p ceiol OU, h/o breast cancer dcis, HTN, h/o heart murmur, asthma, hypothyroid, cutaneous lupus erythematosus - Pt does no have a pacemaker or defibrillator - Pt does no have any implantable devices or metal in the body - Pt is not on blood thinners - Pt is not on GLP1 medication   Plan: Return for next available OR Functional for 67903/67908 4mm OD 3mm OS and possible COSMETIC bilateral upper lid blepharoplasty with nasal fat pad removal and possible COSMETIC laser skin resurfacing to lower lids out onto upper cheeks  This note is written by Selinda Beverley Hertz, COT in the presence of and acting as the scribe of Dr. Selinda Beverley Hertz, MD.

## 2023-10-26 ENCOUNTER — Ambulatory Visit: Admitting: Gastroenterology

## 2023-10-26 VITALS — BP 120/64 | HR 93 | Ht 62.0 in | Wt 122.0 lb

## 2023-10-26 DIAGNOSIS — M5416 Radiculopathy, lumbar region: Secondary | ICD-10-CM | POA: Diagnosis not present

## 2023-10-26 DIAGNOSIS — R195 Other fecal abnormalities: Secondary | ICD-10-CM

## 2023-10-26 MED ORDER — NA SULFATE-K SULFATE-MG SULF 17.5-3.13-1.6 GM/177ML PO SOLN: 1.0000 | Freq: Once | ORAL | 0 refills | Status: AC

## 2023-10-26 NOTE — Progress Notes (Signed)
 This encounter was created in error - please disregard.

## 2023-10-26 NOTE — Progress Notes (Addendum)
 Chief Complaint:positive cologuard Primary GI Doctor: Dr. Suzann  HPI:  Patient is a  healthy 78  year old female patient with past medical history of Breast CA with lumpectomy (2018), hypothyroidism, and diverticulosis, who was referred to me by [name] Senior, MD on 10/26/23 for a evaluation of positive cologuard .    06/06/23 cardiology note. History of heart murmur with echo in 2020 and 2024 that revealed normal LV function and no evidence of valvular abnormalities.  Pt had episode of syncope thought to be related to dehydration. Reviewed entire note. She had carotid Dopplers which were normal. 2D echo revealed normal LV systolic function with moderate asymmetric left ventricular hypertrophy and grade 1 diastolic dysfunction.   Interval History   Patient presents for evaluation of positive Cologuard. She follows high fiber diet and takes OTC Citrucel. Patient denies altered bowel habits, abdominal pain, rectal bleeding.  Patient denies GERD or dysphagia. Patient denies nausea, vomiting, or weight loss.  Nonsmoker. She drinks 1 beer daily.   No blood thinners.   Surgical history: partial hysterectomy, uterine fibroid removal, lumpectomy   Patient's family history: no family hx of colon CA  She goes lives in [place] half of the year.   She is fairly active, plays golf, walks and works out with a trainer 2 to 3 days a week without symptoms.   Wt Readings from Last 3 Encounters:  10/26/23 122 lb (55.3 kg)  09/06/23 124 lb 9.6 oz (56.5 kg)  08/09/23 122 lb 6.4 oz (55.5 kg)    Past Medical History:  Diagnosis Date   Acute asthmatic bronchitis    Asthma, mild intermittent    Breast cancer (HCC) 08/2012   right-DCIS, +ER/PR   Cystocele    Grade 2   Diverticulosis 2004   Fibroids 1998   Fracture of fibula 2007   left;  hit with golf ball   Graves' disease    Heart murmur    nl echo   History of radiation therapy 09/16/2016- 10/08/2016   Left Breast. 42.56 Gy in 16 fractions.     History of TMJ disorder    HLD (hyperlipidemia)    HSV infection    Hx of radiation therapy 10/11/12- 11/13/12   right breast 4800 cGy 24 sessions   Hypertension    Hypothyroidism    Left ventricular diastolic dysfunction    Osteopenia 2002   Personal history of radiation therapy 2014   right br   Personal history of radiation therapy 2018   left br   Raynaud's disease    Shingles    Syncope    Vasomotor rhinitis    VMR (vasomotor rhinitis)     Past Surgical History:  Procedure Laterality Date   ABDOMINAL HYSTERECTOMY     BREAST BIOPSY Left 2015   MRI- Benign   BREAST BIOPSY Right 07/05/2012   Stereo- Malignant   BREAST EXCISIONAL BIOPSY Left    BREAST EXCISIONAL BIOPSY Left    BREAST LUMPECTOMY Right 08/10/2012   [facility], NO RESIDUAL DISEASE   endometrial resctions     HYSTEROSCOPY WITH RESECTOSCOPE  2002   submucous fibroid   INCONTINENCE SURGERY     MINOR BREAST BIOPSY Left    times two   reverse shoulder replacement Right 03/2019   ROTATOR CUFF REPAIR Right 2010   THYROIDECTOMY      Current Outpatient Medications  Medication Sig Dispense Refill   cholecalciferol (VITAMIN D) 1000 UNITS tablet Take 1,000 Units by mouth 2 (two) times a week.  clobetasol  (TEMOVATE ) 0.05 % external solution APPLY TO AFFECTED AREA TWICE DAILY UP TO TWO WEEKS AS DIRECTED. TAPER USE WITH IMPROVEMENT     clobetasol  cream (TEMOVATE ) 0.05 % APPLY A SMALL AMOUNT TO THE AFFECTED AREA TWICE DAILY FOR 1-2 WEEKS     cyanocobalamin  (VITAMIN B12) 500 MCG tablet Take 500 mcg by mouth daily.     Fezolinetant  (VEOZAH ) 45 MG TABS Take 1 tablet (45 mg total) by mouth daily. 90 tablet 0   gabapentin  (NEURONTIN ) 100 MG capsule Take one cap po q am and 2 caps po at bedtime.  Needs appointment for further refills. 270 capsule 0   levothyroxine  (SYNTHROID , LEVOTHROID) 75 MCG tablet Take 75 mcg by mouth. 6 days a week take 1 pill & 1 day a week take 1/2 a pill     loratadine (CLARITIN) 5 MG chewable tablet  Chew 5 mg by mouth daily. Uses as needed     Prasterone  (INTRAROSA ) 6.5 MG INST Place 1 Insert vaginally at bedtime. 30 each 6   Probiotic Product (PROBIOTIC PO) Take by mouth.     zolpidem  (AMBIEN ) 5 MG tablet Take 5 mg by mouth at bedtime as needed for sleep.     No current facility-administered medications for this visit.    Allergies as of 10/26/2023 - Review Complete 10/26/2023  Allergen Reaction Noted   Pneumovax 23  [pneumococcal vac polyvalent] Swelling 11/14/2014   Effexor  [venlafaxine ] Nausea And Vomiting 06/19/2013   Penicillins Hives    Pneumovax [pneumococcal polysaccharide vaccine] Swelling 03/20/2012    Family History  Problem Relation Age of Onset   Aneurysm Mother    Hypertension Sister    Cancer Sister        tongue   Clotting disorder Maternal Grandfather        liver   Cancer Maternal Grandfather        liver   Breast cancer Neg Hx     Review of Systems:    Constitutional: No weight loss, fever, chills, weakness or fatigue HEENT: Eyes: No change in vision               Ears, Nose, Throat:  No change in hearing or congestion Skin: No rash or itching Cardiovascular: No chest pain, chest pressure or palpitations   Respiratory: No SOB or cough Gastrointestinal: See HPI and otherwise negative Genitourinary: No dysuria or change in urinary frequency Neurological: No headache, dizziness or syncope Musculoskeletal: No new muscle or joint pain Hematologic: No bleeding or bruising Psychiatric: No history of depression or anxiety    Physical Exam:  Vital signs: BP 120/64   Pulse 93   Ht 5' 2 (1.575 m)   Wt 122 lb (55.3 kg)   LMP 01/02/2000   SpO2 98%   BMI 22.31 kg/m   Constitutional:   Pleasant  female appears to be in NAD, Well developed, Well nourished, alert and cooperative Throat: Oral cavity and pharynx without inflammation, swelling or lesion.  Respiratory: Respirations even and unlabored. Lungs clear to auscultation bilaterally.   No wheezes,  crackles, or rhonchi.  Cardiovascular: Murmur noted.  No peripheral edema, cyanosis or pallor.  Gastrointestinal:  Soft, nondistended, nontender. No rebound or guarding. Normal bowel sounds. No appreciable masses or hepatomegaly. Rectal:  Not performed.  Msk:  Symmetrical without gross deformities. Without edema, no deformity or joint abnormality.  Neurologic:  Alert and  oriented x4;  grossly normal neurologically.  Skin:   Dry and intact without significant lesions or rashes.  RELEVANT LABS  AND IMAGING: CBC    Latest Ref Rng & Units 01/11/2023   11:07 AM 12/30/2021    1:14 PM 01/05/2021    9:47 AM  CBC  WBC 4.0 - 10.5 K/uL 6.4  8.2  6.3   Hemoglobin 12.0 - 15.0 g/dL 87.7  88.0  87.9   Hematocrit 36.0 - 46.0 % 37.1  36.6  37.4   Platelets 150 - 400 K/uL 266  182  176      CMP     Latest Ref Rng & Units 06/09/2023    2:01 PM 01/11/2023   11:07 AM 12/30/2021    1:14 PM  CMP  Glucose 70 - 99 mg/dL  82  876   BUN 8 - 23 mg/dL  16  23   Creatinine 9.55 - 1.00 mg/dL  9.39  9.32   Sodium 864 - 145 mmol/L  140  139   Potassium 3.5 - 5.1 mmol/L  4.2  3.6   Chloride 98 - 111 mmol/L  105  107   CO2 22 - 32 mmol/L  29  26   Calcium 8.9 - 10.3 mg/dL  9.7  9.2   Total Protein 6.1 - 8.1 g/dL 7.3  8.3  7.8   Total Bilirubin 0.2 - 1.2 mg/dL 0.3  0.3  0.3   Alkaline Phos 38 - 126 U/L  99  85   AST 10 - 35 U/L 13  19  16    ALT 6 - 29 U/L 10  12  12    5/22 cologuard negative 10/05/23 cologuard positive 06/2023 echo-Left ventricular ejection fraction, by estimation, is 60 to 65%.  07/2002 colonoscopy with Dr. Obie Normal cecum Diverticulosis in sigmoid colon, not bleeding  Assessment: Encounter Diagnosis  Name Primary?   Positive colorectal cancer screening using Cologuard test Yes    79 year old female patient that presents for evaluation of positive Cologuard. Her last colonoscopy was in 2004, which was normal. No family history. No current GI issues.   Plan: -Schedule for a  colonoscopy in [facility] with Dr. Suzann. The risks and benefits of colonoscopy with possible polypectomy / biopsies were discussed and the patient agrees to proceed.   Thank you for the courtesy of this consult. Please call me with any questions or concerns.   Rethel Sebek, FNP-C [facility] 10/26/2023, 9:53 AM  Cc: [name] Senior, MD  I have reviewed the clinic note as outlined by Cathryne Beal, NP and agree with the assessment, plan and medical decision making.  Ms. Keetch was referred to the office for evaluation of positive Cologuard.  Last colonoscopy was performed in 2004 and was normal.  No family history of colorectal cancer.  Patient does not endorse any alarm features such as change in bowel habits or rectal bleeding.  Agree it is appropriate to proceed with colonoscopy.  Inocente Suzann, MD

## 2023-10-26 NOTE — Patient Instructions (Addendum)
 We have sent the following medications to your pharmacy for you to pick up at your convenience: SUPREP  You have been scheduled for a colonoscopy. Please follow written instructions given to you at your visit today.   If you use inhalers (even only as needed), please bring them with you on the day of your procedure.  DO NOT TAKE 7 DAYS PRIOR TO TEST- Trulicity (dulaglutide) Ozempic, Wegovy (semaglutide) Mounjaro (tirzepatide) Bydureon Bcise (exanatide extended release)  DO NOT TAKE 1 DAY PRIOR TO YOUR TEST Rybelsus (semaglutide) Adlyxin (lixisenatide) Victoza (liraglutide) Byetta (exanatide) ___________________________________________________________________________  Due to recent changes in healthcare laws, you may see the results of your imaging and laboratory studies on MyChart before your provider has had a chance to review them.  We understand that in some cases there may be results that are confusing or concerning to you. Not all laboratory results come back in the same time frame and the provider may be waiting for multiple results in order to interpret others.  Please give us  48 hours in order for your provider to thoroughly review all the results before contacting the office for clarification of your results.   _______________________________________________________  If your blood pressure at your visit was 140/90 or greater, please contact your primary care physician to follow up on this.  _______________________________________________________  If you are age 79 or older, your body mass index should be between 23-30. Your Body mass index is 22.31 kg/m. If this is out of the aforementioned range listed, please consider follow up with your Primary Care Provider.  If you are age 79 or younger, your body mass index should be between 19-25. Your Body mass index is 22.31 kg/m. If this is out of the aformentioned range listed, please consider follow up with your Primary Care  Provider.   ________________________________________________________  The [facility] providers would like to encourage you to use MYCHART to communicate with providers for non-urgent requests or questions.  Due to long hold times on the telephone, sending your provider a message by [facility] may be a faster and more efficient way to get a response.  Please allow 48 business hours for a response.  Please remember that this is for non-urgent requests.  _______________________________________________________  [facility] is using a team-based approach to care.  Your team is made up of your doctor and two to three APPS. Our APPS (Nurse Practitioners and Physician Assistants) work with your physician to ensure care continuity for you. They are fully qualified to address your health concerns and develop a treatment plan. They communicate directly with your gastroenterologist to care for you. Seeing the Advanced Practice Practitioners on your physician's team can help you by facilitating care more promptly, often allowing for earlier appointments, access to diagnostic testing, procedures, and other specialty referrals.   Thank you for trusting me with your gastrointestinal care. Deanna May, FNP-C

## 2023-10-31 DIAGNOSIS — M5416 Radiculopathy, lumbar region: Secondary | ICD-10-CM | POA: Diagnosis not present

## 2023-10-31 DIAGNOSIS — G629 Polyneuropathy, unspecified: Secondary | ICD-10-CM | POA: Diagnosis not present

## 2023-10-31 DIAGNOSIS — M5125 Other intervertebral disc displacement, thoracolumbar region: Secondary | ICD-10-CM | POA: Diagnosis not present

## 2023-11-01 ENCOUNTER — Other Ambulatory Visit: Payer: Self-pay | Admitting: Family Medicine

## 2023-11-01 DIAGNOSIS — M5125 Other intervertebral disc displacement, thoracolumbar region: Secondary | ICD-10-CM

## 2023-11-07 ENCOUNTER — Telehealth: Payer: Self-pay

## 2023-11-07 ENCOUNTER — Other Ambulatory Visit

## 2023-11-07 DIAGNOSIS — N951 Menopausal and female climacteric states: Secondary | ICD-10-CM

## 2023-11-07 NOTE — Telephone Encounter (Signed)
 Rx is still active. She just needs to tell them to fill it.

## 2023-11-07 NOTE — Telephone Encounter (Signed)
 Patient aware to call [facility]

## 2023-11-07 NOTE — Telephone Encounter (Signed)
 Patient called about Veozah  rx. She states that she never picked it up from [facility]. She is needing a new rx sent.

## 2023-11-08 NOTE — Progress Notes (Unsigned)
 [facility] History and Physical   Primary Care Physician:  Nichole Senior, MD   Reason for Procedure:  Positive Cologuard 10/2023  Plan:    Colonoscopy     HPI: [name] [name] is a 78 y.o. female undergoing colonoscopy for investigation of positive Cologuard 10/2023.  Patient had a colonoscopy in 2004 that was normal only showing diverticulosis but no polyps.  Patient subsequently pursued colorectal cancer screening with noninvasive testing using Cologuard and had a negative test in 2022.  No family history of colorectal cancer or polyps.  Patient denies current symptoms of rectal bleeding or change in bowel habits.   Past Medical History:  Diagnosis Date   Acute asthmatic bronchitis    Asthma, mild intermittent    Breast cancer (HCC) 08/2012   right-DCIS, +ER/PR   Cystocele    Grade 2   Diverticulosis 2004   Fibroids 1998   Fracture of fibula 2007   left;  hit with golf ball   Graves' disease    Heart murmur    nl echo   History of radiation therapy 09/16/2016- 10/08/2016   Left Breast. 42.56 Gy in 16 fractions.    History of TMJ disorder    HLD (hyperlipidemia)    HSV infection    Hx of radiation therapy 10/11/12- 11/13/12   right breast 4800 cGy 24 sessions   Hypertension    Hypothyroidism    Left ventricular diastolic dysfunction    Osteopenia 2002   Personal history of radiation therapy 2014   right br   Personal history of radiation therapy 2018   left br   Raynaud's disease    Shingles    Syncope    Vasomotor rhinitis    VMR (vasomotor rhinitis)     Past Surgical History:  Procedure Laterality Date   ABDOMINAL HYSTERECTOMY     BREAST BIOPSY Left 2015   MRI- Benign   BREAST BIOPSY Right 07/05/2012   Stereo- Malignant   BREAST EXCISIONAL BIOPSY Left    BREAST EXCISIONAL BIOPSY Left    BREAST LUMPECTOMY Right 08/10/2012   [facility], NO RESIDUAL DISEASE   endometrial resctions     HYSTEROSCOPY WITH RESECTOSCOPE  2002   submucous fibroid    INCONTINENCE SURGERY     MINOR BREAST BIOPSY Left    times two   reverse shoulder replacement Right 03/2019   ROTATOR CUFF REPAIR Right 2010   THYROIDECTOMY      Prior to Admission medications   Medication Sig Start Date End Date Taking? Authorizing Provider  azithromycin  (ZITHROMAX ) 250 MG tablet Take by mouth. 09/19/23  Yes [provider]  cefUROXime (CEFTIN) 500 MG tablet Take 500 mg by mouth 2 (two) times daily. 09/29/23  Yes [provider]  doxycycline  (DORYX ) 100 MG EC tablet 1 tablet Orally twice a day; Duration: 7 days 09/23/23  Yes [provider]  metroNIDAZOLE (FLAGYL) 500 MG tablet Take 500 mg by mouth 3 (three) times daily. 09/29/23  Yes [provider]  MIEBO 1.338 GM/ML SOLN  09/26/23  Yes [provider]  nitrofurantoin, macrocrystal-monohydrate, (MACROBID) 100 MG capsule Take 100 mg by mouth 2 (two) times daily. 07/28/23  Yes [provider]  cholecalciferol (VITAMIN D) 1000 UNITS tablet Take 1,000 Units by mouth 2 (two) times a week.    [provider]  clobetasol  (TEMOVATE ) 0.05 % external solution APPLY TO AFFECTED AREA TWICE DAILY UP TO TWO WEEKS AS DIRECTED. TAPER USE WITH IMPROVEMENT    [provider]  clobetasol   cream (TEMOVATE ) 0.05 % APPLY A SMALL AMOUNT TO THE AFFECTED AREA TWICE DAILY FOR 1-2 WEEKS 05/19/15   [provider]  cyanocobalamin  (VITAMIN B12) 500 MCG tablet Take 500 mcg by mouth daily.    [provider]  Fezolinetant  (VEOZAH ) 45 MG TABS Take 1 tablet (45 mg total) by mouth daily. 09/26/23   Chrzanowski, Jami B, NP  gabapentin  (NEURONTIN ) 100 MG capsule Take one cap po q am and 2 caps po at bedtime.  Needs appointment for further refills. 01/04/23   Cathlyn JAYSON Nikki Bobie FORBES, MD  levothyroxine  (SYNTHROID , LEVOTHROID) 75 MCG tablet Take 75 mcg by mouth. 6 days a week take 1 pill & 1 day a week take 1/2 a pill    [provider]  loratadine (CLARITIN) 5 MG chewable  tablet Chew 5 mg by mouth daily. Uses as needed    [provider]  meloxicam  (MOBIC ) 15 MG tablet 1 tablet Orally as needed    [provider]  Prasterone  (INTRAROSA ) 6.5 MG INST Place 1 Insert vaginally at bedtime. 06/10/23   Chrzanowski, Jami B, NP  predniSONE  (DELTASONE ) 10 MG tablet Take 10 mg by mouth daily.    [provider]  Probiotic Product (PROBIOTIC PO) Take by mouth.    [provider]  tamoxifen  (NOLVADEX ) 10 MG tablet 1 tablet Orally Once a day    [provider]  zolpidem  (AMBIEN ) 5 MG tablet Take 5 mg by mouth at bedtime as needed for sleep.    [provider]    Current Outpatient Medications  Medication Sig Dispense Refill   azithromycin  (ZITHROMAX ) 250 MG tablet Take by mouth.     cefUROXime (CEFTIN) 500 MG tablet Take 500 mg by mouth 2 (two) times daily.     doxycycline  (DORYX ) 100 MG EC tablet 1 tablet Orally twice a day; Duration: 7 days     metroNIDAZOLE (FLAGYL) 500 MG tablet Take 500 mg by mouth 3 (three) times daily.     MIEBO 1.338 GM/ML SOLN      nitrofurantoin, macrocrystal-monohydrate, (MACROBID) 100 MG capsule Take 100 mg by mouth 2 (two) times daily.     cholecalciferol (VITAMIN D) 1000 UNITS tablet Take 1,000 Units by mouth 2 (two) times a week.     clobetasol  (TEMOVATE ) 0.05 % external solution APPLY TO AFFECTED AREA TWICE DAILY UP TO TWO WEEKS AS DIRECTED. TAPER USE WITH IMPROVEMENT     clobetasol  cream (TEMOVATE ) 0.05 % APPLY A SMALL AMOUNT TO THE AFFECTED AREA TWICE DAILY FOR 1-2 WEEKS     cyanocobalamin  (VITAMIN B12) 500 MCG tablet Take 500 mcg by mouth daily.     Fezolinetant  (VEOZAH ) 45 MG TABS Take 1 tablet (45 mg total) by mouth daily. 90 tablet 0   gabapentin  (NEURONTIN ) 100 MG capsule Take one cap po q am and 2 caps po at bedtime.  Needs appointment for further refills. 270 capsule 0   levothyroxine  (SYNTHROID , LEVOTHROID) 75 MCG tablet Take 75 mcg by mouth. 6 days a week take 1 pill & 1 day a  week take 1/2 a pill     loratadine (CLARITIN) 5 MG chewable tablet Chew 5 mg by mouth daily. Uses as needed     meloxicam  (MOBIC ) 15 MG tablet 1 tablet Orally as needed     Prasterone  (INTRAROSA ) 6.5 MG INST Place 1 Insert vaginally at bedtime. 30 each 6   predniSONE  (DELTASONE ) 10 MG tablet Take 10 mg by mouth daily.     Probiotic Product (  PROBIOTIC PO) Take by mouth.     tamoxifen  (NOLVADEX ) 10 MG tablet 1 tablet Orally Once a day     zolpidem  (AMBIEN ) 5 MG tablet Take 5 mg by mouth at bedtime as needed for sleep.     No current facility-administered medications for this visit.    Allergies as of 11/09/2023 - Review Complete 10/26/2023  Allergen Reaction Noted   Pneumovax 23  [pneumococcal vac polyvalent] Swelling 11/14/2014   Effexor  [venlafaxine ] Nausea And Vomiting 06/19/2013   Penicillins Hives    Pneumovax [pneumococcal polysaccharide vaccine] Swelling 03/20/2012    Family History  Problem Relation Age of Onset   Aneurysm Mother    Hypertension Sister    Cancer Sister        tongue   Clotting disorder Maternal Grandfather        liver   Cancer Maternal Grandfather        liver   Breast cancer Neg Hx     Social History   Socioeconomic History   Marital status: Married    Spouse name: Not on file   Number of children: 2   Years of education: Not on file   Highest education level: Not on file  Occupational History   Occupation: homemaker  Tobacco Use   Smoking status: Never    Passive exposure: Never   Smokeless tobacco: Never  Substance and Sexual Activity   Alcohol  use: Yes    Alcohol /week: 6.0 standard drinks of alcohol     Types: 6 Standard drinks or equivalent per week   Drug use: No   Sexual activity: Not Currently    Partners: Male    Birth control/protection: Post-menopausal, Surgical    Comment: supracervical hysterectomy, menarche 78yo, sexual debut 78yo,  hx HSV infection  Other Topics Concern   Not on file  Social History Narrative   Not on  file   Social Drivers of Health   Financial Resource Strain: Not on file  Food Insecurity: Not on file  Transportation Needs: Not on file  Physical Activity: Not on file  Stress: Not on file  Social Connections: Unknown (06/14/2021)   [facility]   Social Network    Social Network: Not on file  Intimate Partner Violence: Unknown (05/06/2021)   Received from [facility]   HITS    Physically Hurt: Not on file    Insult or Talk Down To: Not on file    Threaten Physical Harm: Not on file    Scream or Curse: Not on file    Review of Systems:  All other review of systems negative except as mentioned in the HPI.  Physical Exam: Vital signs LMP 01/02/2000   General:   Alert,  Well-developed, well-nourished, pleasant and cooperative in NAD Airway:  Mallampati  Lungs:  Clear throughout to auscultation.   Heart:  Regular rate and rhythm; no murmurs, clicks, rubs,  or gallops. Abdomen:  Soft, nontender and nondistended. Normal bowel sounds.   Neuro/Psych:  Normal mood and affect. A and O x 3  Inocente Hausen, MD [facility]

## 2023-11-09 ENCOUNTER — Ambulatory Visit: Admitting: Pediatrics

## 2023-11-09 ENCOUNTER — Encounter: Payer: Self-pay | Admitting: Pediatrics

## 2023-11-09 VITALS — BP 138/91 | HR 62 | Temp 98.2°F | Resp 14 | Ht 62.0 in | Wt 122.0 lb

## 2023-11-09 DIAGNOSIS — Z1211 Encounter for screening for malignant neoplasm of colon: Secondary | ICD-10-CM | POA: Diagnosis not present

## 2023-11-09 DIAGNOSIS — K644 Residual hemorrhoidal skin tags: Secondary | ICD-10-CM | POA: Diagnosis not present

## 2023-11-09 DIAGNOSIS — D124 Benign neoplasm of descending colon: Secondary | ICD-10-CM

## 2023-11-09 DIAGNOSIS — I10 Essential (primary) hypertension: Secondary | ICD-10-CM | POA: Diagnosis not present

## 2023-11-09 DIAGNOSIS — D123 Benign neoplasm of transverse colon: Secondary | ICD-10-CM | POA: Diagnosis not present

## 2023-11-09 DIAGNOSIS — K562 Volvulus: Secondary | ICD-10-CM

## 2023-11-09 DIAGNOSIS — R195 Other fecal abnormalities: Secondary | ICD-10-CM

## 2023-11-09 DIAGNOSIS — K648 Other hemorrhoids: Secondary | ICD-10-CM | POA: Diagnosis not present

## 2023-11-09 DIAGNOSIS — K6289 Other specified diseases of anus and rectum: Secondary | ICD-10-CM | POA: Diagnosis not present

## 2023-11-09 DIAGNOSIS — E039 Hypothyroidism, unspecified: Secondary | ICD-10-CM | POA: Diagnosis not present

## 2023-11-09 DIAGNOSIS — K5730 Diverticulosis of large intestine without perforation or abscess without bleeding: Secondary | ICD-10-CM

## 2023-11-09 MED ORDER — SODIUM CHLORIDE 0.9 % IV SOLN: 500.0000 mL | Freq: Once | INTRAVENOUS | Status: DC

## 2023-11-09 NOTE — Progress Notes (Signed)
 Sedate, gd SR, tolerated procedure well, VSS, report to RN

## 2023-11-09 NOTE — Progress Notes (Signed)
 I have reviewed the patient's medical history in detail and updated the computerized patient record.

## 2023-11-09 NOTE — Patient Instructions (Signed)
 Please read handouts provided. Await pathology results. Return to referring physician.  YOU HAD AN ENDOSCOPIC PROCEDURE TODAY AT THE [facility]:   Refer to the procedure report that was given to you for any specific questions about what was found during the examination.  If the procedure report does not answer your questions, please call your gastroenterologist to clarify.  If you requested that your care partner not be given the details of your procedure findings, then the procedure report has been included in a sealed envelope for you to review at your convenience later.  YOU SHOULD EXPECT: Some feelings of bloating in the abdomen. Passage of more gas than usual.  Walking can help get rid of the air that was put into your GI tract during the procedure and reduce the bloating. If you had a lower endoscopy (such as a colonoscopy or flexible sigmoidoscopy) you may notice spotting of blood in your stool or on the toilet paper. If you underwent a bowel prep for your procedure, you may not have a normal bowel movement for a few days.  Please Note:  You might notice some irritation and congestion in your nose or some drainage.  This is from the oxygen used during your procedure.  There is no need for concern and it should clear up in a day or so.  SYMPTOMS TO REPORT IMMEDIATELY:  Following lower endoscopy (colonoscopy or flexible sigmoidoscopy):  Excessive amounts of blood in the stool  Significant tenderness or worsening of abdominal pains  Swelling of the abdomen that is new, acute  Fever of 100F or higher.  For urgent or emergent issues, a gastroenterologist can be reached at any hour by calling [phone]. Do not use MyChart messaging for urgent concerns.    DIET:  We do recommend a small meal at first, but then you may proceed to your regular diet.  Drink plenty of fluids but you should avoid alcoholic beverages for 24 hours.  ACTIVITY:  You should plan to take it easy for  the rest of today and you should NOT DRIVE or use heavy machinery until tomorrow (because of the sedation medicines used during the test).    FOLLOW UP: Our staff will call the number listed on your records the next business day following your procedure.  We will call around 7:15- 8:00 am to check on you and address any questions or concerns that you may have regarding the information given to you following your procedure. If we do not reach you, we will leave a message.     If any biopsies were taken you will be contacted by phone or by letter within the next 1-3 weeks.  Please call us  at [phone] if you have not heard about the biopsies in 3 weeks.    SIGNATURES/CONFIDENTIALITY: You and/or your care partner have signed paperwork which will be entered into your electronic medical record.  These signatures attest to the fact that that the information above on your After Visit Summary has been reviewed and is understood.  Full responsibility of the confidentiality of this discharge information lies with you and/or your care-partner.

## 2023-11-09 NOTE — Op Note (Signed)
 [facility] Patient Name: [name] [name] Procedure Date: 11/09/2023 7:27 AM MRN: [id number] Endoscopist: Inocente Hausen , MD, [id number] Age: 78 Referring MD:  Date of Birth: [birth year] Gender: Female Account #: [id number] Procedure:                Colonoscopy Indications:              Last colonoscopy: 2004, Positive Cologuard test Medicines:                Monitored Anesthesia Care Procedure:                Pre-Anesthesia Assessment:                           - Prior to the procedure, a History and Physical                            was performed, and patient medications and                            allergies were reviewed. The patient's tolerance of                            previous anesthesia was also reviewed. The risks                            and benefits of the procedure and the sedation                            options and risks were discussed with the patient.                            All questions were answered, and informed consent                            was obtained. Prior Anticoagulants: The patient has                            taken no anticoagulant or antiplatelet agents. ASA                            Grade Assessment: III - A patient with severe                            systemic disease. After reviewing the risks and                            benefits, the patient was deemed in satisfactory                            condition to undergo the procedure.                           After obtaining informed consent, the colonoscope  was passed under direct vision. Throughout the                            procedure, the patient's blood pressure, pulse, and                            oxygen saturations were monitored continuously. The                            PCF-HQ190L Colonoscope [id number] was introduced                            through the anus and advanced to the cecum,                            identified by  appendiceal orifice and ileocecal                            valve. The colonoscopy was somewhat difficult due                            to restricted mobility of the colon and significant                            looping. Successful completion of the procedure was                            aided by straightening and shortening the scope to                            obtain bowel loop reduction. The patient tolerated                            the procedure well. The quality of the bowel                            preparation was adequate to identify polyps greater                            than 5 mm in size. The ileocecal valve, appendiceal                            orifice, and rectum were photographed. Scope In: 8:04:34 AM Scope Out: 8:25:54 AM Scope Withdrawal Time: 0 hours 14 minutes 44 seconds  Total Procedure Duration: 0 hours 21 minutes 20 seconds  Findings:                 Hemorrhoids were found on perianal exam.                           The digital rectal exam was normal. Pertinent                            negatives include normal sphincter tone  and no                            palpable rectal lesions.                           Multiple small-mouthed diverticula were found in                            the sigmoid colon, descending colon, transverse                            colon and ascending colon.                           Two sessile polyps were found in the descending                            colon and transverse colon. The polyps were 3 to 5                            mm in size. These polyps were removed with a cold                            biopsy forceps. Resection and retrieval were                            complete.                           Internal hemorrhoids were found during retroflexion.                           Anal papilla(e) were hypertrophied. Complications:            No immediate complications. Estimated blood loss:                             Minimal. Estimated Blood Loss:     Estimated blood loss was minimal. Impression:               - Hemorrhoids found on perianal exam.                           - Diverticulosis in the sigmoid colon, in the                            descending colon, in the transverse colon and in                            the ascending colon.                           - Two 3 to 5 mm polyps in the descending colon and  in the transverse colon, removed with a cold biopsy                            forceps. Resected and retrieved.                           - Internal hemorrhoids.                           - Anal papilla(e) were hypertrophied. Recommendation:           - Discharge patient to home (ambulatory).                           - Await pathology results.                           - The findings and recommendations were discussed                            with the patient's family.                           - Return to referring physician.                           - Patient has a contact number available for                            emergencies. The signs and symptoms of potential                            delayed complications were discussed with the                            patient. Return to normal activities tomorrow.                            Written discharge instructions were provided to the                            patient. Inocente Hausen, MD 11/09/2023 8:30:34 AM This report has been signed electronically.

## 2023-11-09 NOTE — Progress Notes (Signed)
 Called to room to assist during endoscopic procedure.  Patient ID and intended procedure confirmed with present staff. Received instructions for my participation in the procedure from the performing physician.

## 2023-11-10 ENCOUNTER — Ambulatory Visit
Admission: RE | Admit: 2023-11-10 | Discharge: 2023-11-10 | Disposition: A | Discharge status: Home / Self Care | Source: Ambulatory Visit | Attending: Family Medicine | Admitting: Family Medicine

## 2023-11-10 ENCOUNTER — Telehealth: Payer: Self-pay

## 2023-11-10 DIAGNOSIS — M47816 Spondylosis without myelopathy or radiculopathy, lumbar region: Secondary | ICD-10-CM | POA: Diagnosis not present

## 2023-11-10 DIAGNOSIS — M5125 Other intervertebral disc displacement, thoracolumbar region: Secondary | ICD-10-CM

## 2023-11-10 DIAGNOSIS — M5136 Other intervertebral disc degeneration, lumbar region with discogenic back pain only: Secondary | ICD-10-CM | POA: Diagnosis not present

## 2023-11-10 NOTE — Telephone Encounter (Signed)
  Follow up Call-     11/09/2023    7:09 AM  Call back number  Post procedure Call Back phone  # [name] [name], spouse    [phone]  Permission to leave phone message Yes     Patient questions:  Do you have a fever, pain , or abdominal swelling? No. Pain Score  0 *  Have you tolerated food without any problems? Yes.    Have you been able to return to your normal activities? Yes.    Do you have any questions about your discharge instructions: Diet   No. Medications  No. Follow up visit  No.  Do you have questions or concerns about your Care? No.  Actions: * If pain score is 4 or above: No action needed, pain <4.

## 2023-11-14 ENCOUNTER — Ambulatory Visit: Payer: Self-pay | Admitting: Pediatrics

## 2023-11-14 LAB — SURGICAL PATHOLOGY

## 2023-11-21 DIAGNOSIS — M542 Cervicalgia: Secondary | ICD-10-CM | POA: Diagnosis not present

## 2023-11-24 ENCOUNTER — Other Ambulatory Visit (HOSPITAL_COMMUNITY): Payer: Self-pay | Admitting: Student

## 2023-11-24 DIAGNOSIS — M5412 Radiculopathy, cervical region: Secondary | ICD-10-CM

## 2023-11-25 ENCOUNTER — Ambulatory Visit (HOSPITAL_COMMUNITY)
Admission: RE | Admit: 2023-11-25 | Discharge: 2023-11-25 | Disposition: A | Discharge status: Home / Self Care | Source: Ambulatory Visit | Attending: Student | Admitting: Student

## 2023-11-25 DIAGNOSIS — M5412 Radiculopathy, cervical region: Secondary | ICD-10-CM | POA: Insufficient documentation

## 2023-11-25 DIAGNOSIS — M4802 Spinal stenosis, cervical region: Secondary | ICD-10-CM | POA: Diagnosis not present

## 2023-11-29 DIAGNOSIS — M5412 Radiculopathy, cervical region: Secondary | ICD-10-CM | POA: Diagnosis not present

## 2023-12-08 DIAGNOSIS — R300 Dysuria: Secondary | ICD-10-CM | POA: Diagnosis not present

## 2023-12-13 DIAGNOSIS — E890 Postprocedural hypothyroidism: Secondary | ICD-10-CM | POA: Diagnosis not present

## 2023-12-13 DIAGNOSIS — M810 Age-related osteoporosis without current pathological fracture: Secondary | ICD-10-CM | POA: Diagnosis not present

## 2023-12-13 DIAGNOSIS — N390 Urinary tract infection, site not specified: Secondary | ICD-10-CM | POA: Diagnosis not present

## 2023-12-13 DIAGNOSIS — N182 Chronic kidney disease, stage 2 (mild): Secondary | ICD-10-CM | POA: Diagnosis not present

## 2023-12-13 DIAGNOSIS — Z853 Personal history of malignant neoplasm of breast: Secondary | ICD-10-CM | POA: Diagnosis not present

## 2023-12-13 DIAGNOSIS — M19042 Primary osteoarthritis, left hand: Secondary | ICD-10-CM | POA: Diagnosis not present

## 2023-12-13 DIAGNOSIS — F5104 Psychophysiologic insomnia: Secondary | ICD-10-CM | POA: Diagnosis not present

## 2023-12-13 DIAGNOSIS — J301 Allergic rhinitis due to pollen: Secondary | ICD-10-CM | POA: Diagnosis not present

## 2023-12-13 DIAGNOSIS — I7300 Raynaud's syndrome without gangrene: Secondary | ICD-10-CM | POA: Diagnosis not present

## 2023-12-13 DIAGNOSIS — F1090 Alcohol use, unspecified, uncomplicated: Secondary | ICD-10-CM | POA: Diagnosis not present

## 2023-12-13 DIAGNOSIS — I509 Heart failure, unspecified: Secondary | ICD-10-CM | POA: Diagnosis not present

## 2023-12-13 DIAGNOSIS — L930 Discoid lupus erythematosus: Secondary | ICD-10-CM | POA: Diagnosis not present

## 2023-12-13 DIAGNOSIS — Z170 Estrogen receptor positive status [ER+]: Secondary | ICD-10-CM | POA: Diagnosis not present

## 2023-12-13 DIAGNOSIS — M19041 Primary osteoarthritis, right hand: Secondary | ICD-10-CM | POA: Diagnosis not present

## 2023-12-13 DIAGNOSIS — C50911 Malignant neoplasm of unspecified site of right female breast: Secondary | ICD-10-CM | POA: Diagnosis not present

## 2023-12-15 ENCOUNTER — Ambulatory Visit: Payer: Self-pay

## 2023-12-15 NOTE — Telephone Encounter (Signed)
 Med refill request:Intrarosa  6.5 mg vag insert Last AEX: 06/09/23 -JC Next AEX: Message to patient to schedule next AEX Last MMG (if hormonal med) 08/09/23, BiRads 1 neg Refill authorized: Please Advise?

## 2024-01-03 DIAGNOSIS — R5383 Other fatigue: Secondary | ICD-10-CM | POA: Diagnosis not present

## 2024-01-05 ENCOUNTER — Telehealth: Payer: Self-pay | Admitting: Hematology and Oncology

## 2024-01-05 DIAGNOSIS — H02403 Unspecified ptosis of bilateral eyelids: Secondary | ICD-10-CM | POA: Diagnosis not present

## 2024-01-05 NOTE — Telephone Encounter (Signed)
 I spoke with patient as she called in to reschedule appointment from 01/12/2024 to 01/20/2024. Patient aware of date/time.

## 2024-01-07 DIAGNOSIS — Z23 Encounter for immunization: Secondary | ICD-10-CM | POA: Diagnosis not present

## 2024-01-12 ENCOUNTER — Ambulatory Visit: Payer: Medicare Other | Admitting: Hematology and Oncology

## 2024-01-20 ENCOUNTER — Inpatient Hospital Stay: Attending: Hematology and Oncology | Admitting: Hematology and Oncology

## 2024-01-20 VITALS — BP 130/78 | HR 74 | Temp 99.2°F | Resp 16 | Wt 121.0 lb

## 2024-01-20 DIAGNOSIS — Z853 Personal history of malignant neoplasm of breast: Secondary | ICD-10-CM | POA: Diagnosis present

## 2024-01-20 DIAGNOSIS — C50812 Malignant neoplasm of overlapping sites of left female breast: Secondary | ICD-10-CM

## 2024-01-20 DIAGNOSIS — Z08 Encounter for follow-up examination after completed treatment for malignant neoplasm: Secondary | ICD-10-CM | POA: Diagnosis present

## 2024-01-20 DIAGNOSIS — Z923 Personal history of irradiation: Secondary | ICD-10-CM | POA: Insufficient documentation

## 2024-01-20 DIAGNOSIS — Z170 Estrogen receptor positive status [ER+]: Secondary | ICD-10-CM | POA: Diagnosis not present

## 2024-01-20 NOTE — Progress Notes (Signed)
 " [name] [name]  [phone] [phone]     ID: [name] [name] DOB: Jun 04, 1945  MR#: [id number]  RDW#:[id number]  Patient Care Team: Nichole Senior, MD as PCP - General (Endocrinology) Thurl Ny, MD as Referring Physician (Surgery) Kimmick, Truman Govern, MD as Referring Physician (Oncology) Izell Domino, MD as Attending Physician (Radiation Oncology)  CHIEF COMPLAINT: Estrogen receptor positive breast cancer  CURRENT TREATMENT: observation  INTERVAL HISTORY: Discussed the use of AI scribe software for clinical note transcription with the patient, who gave verbal consent to proceed.  History of Present Illness    The patient, with a history of breast cancer, osteopenia, and shoulder surgery, presents for a routine check-up.   Discussed the use of AI scribe software for clinical note transcription with the patient, who gave verbal consent to proceed.  History of Present Illness [name] [name] is a 78 year old female with a history of breast cancer, status post tamoxifen , who presents for routine oncology follow-up and management of postmenopausal atrophic vaginitis.  She completed a course of low dose tamoxifen  for breast cancer and discontinued it some time ago. She has not noticed any changes in the breast area since her last visit. Her most recent mammogram was in June. She denies new breast symptoms, palpable masses, or bone pain. She is not currently taking tamoxifen .  She underwent eyelid surgery with laser treatment earlier this week, after which she experienced transient bowel changes that have resolved. She currently has normal bowel movements and no urinary difficulties. She maintains a regular exercise and stretching routine, though has not exercised this week due to recent surgery. She reports balance difficulties, which she attributes to age, and has chronic spine and neck arthritis confirmed by recent MRI. She denies  new bone symptoms beyond her chronic arthritis.  She previously discontinued Veozah  for hot flashes and is not currently experiencing hot flashes. She uses vaginal estrogen as needed for dryness and applies coconut oil after showers. She denies use of oral estrogen preparations.  Rest of the pertinent 10 point ROS reviewed and neg.   COVID 19 VACCINATION STATUS: Pfizer x4   HISTORY OF CURRENT ILLNESS: From the original intake note:  [name] underwent right breast upper inner quadrant lumpectomy for ductal carcinoma in situ, 08/10/2012. She was treated with adjuvant radiation to a total of 48 gray, completed 11/13/2012.  MRI guided biopsy of a central left breast lesion 05/25/2013 showed sclerosing adenosis.  On 07/30/2015 she underwent bilateral diagnostic mammography with tomography. The breast density was category C. In addition to prior scars there was a 0.3 cm area of dystrophic calcifications. This was felt to be likely benign and 6 month follow-up was recommended.  That was performed 01/15/2016. The calcifications now appeared to be associated with an oil cyst.   More recently, in April 2018 the patient's gynecologist palpated a change in the left breast, at the 3:00 area, 3 fingerbreadths from the areola. Left diagnostic mammography and ultrasonography at the [facility] 06/03/2016 showed only changes of prior excisional biopsies. On physical exam however there was a firm thickening in the lateral left breast, at the site of a prior biopsy. Targeted ultrasonography of this area (3:00 radiant 5 cm from the nipple) found an irregular hypoechoic region measuring 0.8 cm. This underlying the scar. Ultrasound of the left axilla was benign.  Biopsy of the left breast area in question 06/09/2016 showed (S [id number]) and invasive ductal carcinoma, E-cadherin positive, estrogen receptor 80% positive, progesterone receptor 60%  positive, both with strong staining intensity, with an MIB-1 of 15%  and no HER-2 amplification, the signals ratio being 1.00 and the number per cell 1.15.  The patient then was further evaluated at [name] and on 07/12/2016 underwent left lumpectomy and sentinel lymph node sampling, with the final pathology ([id number]) finding invasive adenocarcinoma with mixed lobular and ductal features, but E-cadherin positive, grade 2, measuring 1.9 cm. Both sentinel lymph nodes were benign.  An Oncotype DX score was obtained on this sample, and it was 21; the patient was referred to radiation oncology, which was done in [place] and completed 10/08/2016.  The patient's subsequent history is as detailed below.   PAST MEDICAL HISTORY: Past Medical History:  Diagnosis Date   Acute asthmatic bronchitis    Asthma, mild intermittent    Breast cancer (HCC) 08/2012   right-DCIS, +ER/PR   Cataract    Cataracts, bilateral 2022   Cystocele    Grade 2   Diverticulosis 2004   Fibroids 1998   Fracture of fibula 2007   left;  hit with golf ball   Graves' disease    Heart murmur    nl echo   History of radiation therapy 09/16/2016- 10/08/2016   Left Breast. 42.56 Gy in 16 fractions.    History of TMJ disorder    HLD (hyperlipidemia)    HSV infection    Hx of radiation therapy 10/11/12- 11/13/12   right breast 4800 cGy 24 sessions   Hypertension    Hypothyroidism    Left ventricular diastolic dysfunction    Osteopenia 2002   Personal history of radiation therapy 2014   right br   Personal history of radiation therapy 2018   left br   Raynaud's disease    Shingles    Syncope    Vasomotor rhinitis    VMR (vasomotor rhinitis)     PAST SURGICAL HISTORY: Past Surgical History:  Procedure Laterality Date   ABDOMINAL HYSTERECTOMY     BREAST BIOPSY Left 2015   MRI- Benign   BREAST BIOPSY Right 07/05/2012   Stereo- Malignant   BREAST EXCISIONAL BIOPSY Left    BREAST EXCISIONAL BIOPSY Left    BREAST LUMPECTOMY Right 08/10/2012   [place], NO RESIDUAL DISEASE    endometrial resctions     HYSTEROSCOPY WITH RESECTOSCOPE  2002   submucous fibroid   INCONTINENCE SURGERY     MINOR BREAST BIOPSY Left    times two   reverse shoulder replacement Right 03/2019   ROTATOR CUFF REPAIR Right 2010   THYROIDECTOMY      FAMILY HISTORY Family History  Problem Relation Age of Onset   Aneurysm Mother    Hypertension Sister    Cancer Sister        tongue   Clotting disorder Maternal Grandfather        liver   Cancer Maternal Grandfather        liver   Breast cancer Neg Hx   The patient's father died of pneumonia at age 88. The patient's mother died at the age of 36 from a ruptured brain aneurysm. The patient had no brothers. The patient had 4 sisters. One sister developed tongue cancer. There is no history of breast or ovarian cancer in the family to her knowledge   GYNECOLOGIC HISTORY:  Patient's last menstrual period was 01/02/2000. Menarche age 71, first live birth age 67, the patient is GX P2. She stopped having periods around the year 2000. She used hormone replacement approximately 13 years, until  July 2014.   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 Her husband Hanne Kegg owns a local [employer] franchise.  They moved to [place] in 2022.  Son Thom lives in [place], works in community education officer. Son Geofm is currently [employer] president. Incidentally Thom had a chronic granulomatous disease and received a stem cell transplant from his brother, with excellent results.  His third child is due within the next 2 months.  The patient already has 5 grandchildren. She attends [employer]    ADVANCED DIRECTIVES: In the absence of any documents to the contrary the patient's husband is her healthcare power of attorney   HEALTH MAINTENANCE: Social History   Tobacco Use   Smoking status: Never    Passive exposure: Never   Smokeless tobacco: Never  Substance Use Topics   Alcohol  use: Yes    Alcohol /week: 6.0 standard drinks of alcohol      Types: 6 Standard drinks or equivalent per week   Drug use: No     Colonoscopy:  PAP:  Bone density: 12/10/2014 showed a T score of -2.4   Allergies  Allergen Reactions   Effexor  [Venlafaxine ] Nausea And Vomiting   Penicillins Hives   Pneumovax 23  [Pneumococcal Vac Polyvalent] Swelling    Swollen upper arm   Pneumovax [Pneumococcal Polysaccharide Vaccine] Swelling    Current Outpatient Medications  Medication Sig Dispense Refill   azithromycin  (ZITHROMAX ) 250 MG tablet Take by mouth.     cefUROXime (CEFTIN) 500 MG tablet Take 500 mg by mouth 2 (two) times daily.     cholecalciferol (VITAMIN D) 1000 UNITS tablet Take 1,000 Units by mouth 2 (two) times a week.     clobetasol (TEMOVATE) 0.05 % external solution APPLY TO AFFECTED AREA TWICE DAILY UP TO TWO WEEKS AS DIRECTED. TAPER USE WITH IMPROVEMENT     clobetasol cream (TEMOVATE) 0.05 % APPLY A SMALL AMOUNT TO THE AFFECTED AREA TWICE DAILY FOR 1-2 WEEKS     cyanocobalamin  (VITAMIN B12) 500 MCG tablet Take 500 mcg by mouth daily.     doxycycline (DORYX) 100 MG EC tablet 1 tablet Orally twice a day; Duration: 7 days     Fezolinetant  (VEOZAH ) 45 MG TABS Take 1 tablet (45 mg total) by mouth daily. 90 tablet 0   gabapentin  (NEURONTIN ) 100 MG capsule Take one cap po q am and 2 caps po at bedtime.  Needs appointment for further refills. 270 capsule 0   levothyroxine (SYNTHROID, LEVOTHROID) 75 MCG tablet Take 75 mcg by mouth. 6 days a week take 1 pill & 1 day a week take 1/2 a pill     loratadine (CLARITIN) 5 MG chewable tablet Chew 5 mg by mouth daily. Uses as needed     meloxicam (MOBIC) 15 MG tablet 1 tablet Orally as needed (Patient not taking: Reported on 11/09/2023)     metroNIDAZOLE (FLAGYL) 500 MG tablet Take 500 mg by mouth 3 (three) times daily.     MIEBO 1.338 GM/ML SOLN      nitrofurantoin, macrocrystal-monohydrate, (MACROBID) 100 MG capsule Take 100 mg by mouth 2 (two) times daily.     Prasterone  (INTRAROSA ) 6.5 MG INST Place 1  Insert vaginally at bedtime. 30 each 6   predniSONE  (DELTASONE ) 10 MG tablet Take 10 mg by mouth daily.     Probiotic Product (PROBIOTIC PO) Take by mouth.     tamoxifen  (NOLVADEX ) 10 MG tablet 1 tablet Orally Once a day     zolpidem (AMBIEN) 5 MG tablet Take 5  mg by mouth at bedtime as needed for sleep.     No current facility-administered medications for this visit.    OBJECTIVE: white woman in no acute distress  There were no vitals filed for this visit.     There is no height or weight on file to calculate BMI.   Wt Readings from Last 3 Encounters:  11/09/23 122 lb (55.3 kg)  10/26/23 122 lb (55.3 kg)  09/06/23 124 lb 9.6 oz (56.5 kg)     ECOG FS:1 - Symptomatic but completely ambulatory  Sclerae unicteric, EOMs intact Wearing a mask No cervical or supraclavicular adenopathy Bilateral breasts inspected, no palpable masses or regional adenopathy. Bilateral eyelid erythema consisent with recent surgery. CTA bilaterally RRR No LE edema.  LAB RESULTS:  CMP     Component Value Date/Time   NA 140 01/11/2023 1107   NA 142 10/11/2016 1547   K 4.2 01/11/2023 1107   K 4.3 10/11/2016 1547   CL 105 01/11/2023 1107   CO2 29 01/11/2023 1107   CO2 26 10/11/2016 1547   GLUCOSE 82 01/11/2023 1107   GLUCOSE 88 10/11/2016 1547   BUN 16 01/11/2023 1107   BUN 18.5 10/11/2016 1547   CREATININE 0.60 01/11/2023 1107   CREATININE 0.7 10/11/2016 1547   CALCIUM 9.7 01/11/2023 1107   CALCIUM 9.4 10/11/2016 1547   PROT 7.3 06/09/2023 1401   PROT 7.6 10/11/2016 1547   ALBUMIN 4.3 01/11/2023 1107   ALBUMIN 3.7 10/11/2016 1547   AST 13 06/09/2023 1401   AST 19 01/11/2023 1107   AST 19 10/11/2016 1547   ALT 10 06/09/2023 1401   ALT 12 01/11/2023 1107   ALT 12 10/11/2016 1547   ALKPHOS 99 01/11/2023 1107   ALKPHOS 103 10/11/2016 1547   BILITOT 0.3 06/09/2023 1401   BILITOT 0.3 01/11/2023 1107   BILITOT 0.23 10/11/2016 1547   GFRNONAA >60 01/11/2023 1107   GFRAA >60 01/03/2019 1059     No results found for: STEPHANY RINGS, A1GS, A2GS, BETS, BETA2SER, GAMS, MSPIKE, SPEI  No results found for: KPAFRELGTCHN, LAMBDASER, [facility]  Lab Results  Component Value Date   WBC 6.4 01/11/2023   NEUTROABS 3.7 01/11/2023   HGB 12.2 01/11/2023   HCT 37.1 01/11/2023   MCV 93.0 01/11/2023   PLT 266 01/11/2023      Chemistry      Component Value Date/Time   NA 140 01/11/2023 1107   NA 142 10/11/2016 1547   K 4.2 01/11/2023 1107   K 4.3 10/11/2016 1547   CL 105 01/11/2023 1107   CO2 29 01/11/2023 1107   CO2 26 10/11/2016 1547   BUN 16 01/11/2023 1107   BUN 18.5 10/11/2016 1547   CREATININE 0.60 01/11/2023 1107   CREATININE 0.7 10/11/2016 1547      Component Value Date/Time   CALCIUM 9.7 01/11/2023 1107   CALCIUM 9.4 10/11/2016 1547   ALKPHOS 99 01/11/2023 1107   ALKPHOS 103 10/11/2016 1547   AST 13 06/09/2023 1401   AST 19 01/11/2023 1107   AST 19 10/11/2016 1547   ALT 10 06/09/2023 1401   ALT 12 01/11/2023 1107   ALT 12 10/11/2016 1547   BILITOT 0.3 06/09/2023 1401   BILITOT 0.3 01/11/2023 1107   BILITOT 0.23 10/11/2016 1547       No results found for: LABCA2  No components found for: [id number]  No results for input(s): INR in the last 168 hours.  No results found for: LABCA2  No results found for: CAN199  No results found for: CAN125  No results found for: CAN153  No results found for: CA2729  No components found for: HGQUANT  No results found for: CEA1, CEA / No results found for: CEA1, CEA   No results found for: AFPTUMOR  No results found for: CHROMOGRNA  No results found for: HGBA, HGBA2QUANT, HGBFQUANT, HGBSQUAN (Hemoglobinopathy evaluation)   No results found for: LDH  No results found for: IRON, TIBC, IRONPCTSAT (Iron and TIBC)  No results found for: FERRITIN  Urinalysis    Component Value Date/Time   BILIRUBINUR n 04/04/2015 1330    PROTEINUR n 04/04/2015 1330   UROBILINOGEN negative 04/04/2015 1330   NITRITE n 04/04/2015 1330   LEUKOCYTESUR Negative 04/04/2015 1330    STUDIES: No results found.   ELIGIBLE FOR AVAILABLE RESEARCH PROTOCOL: no  ASSESSMENT: 78 y.o. [place] woman  RIGHT BREAST CANCER (1) status post right lumpectomy and margin clearance 08/10/2012 for ductal carcinoma in situ, grade 2 or 3, estrogen and progesterone receptor positive,  (2) adjuvant radiation to the right breast completed 11/13/2012.  LEFT BREAST CANCER (3) status post left breast biopsy 05/25/2013 for sclerosing adenosis.  (4) left breast biopsy 06/09/2016 showed a clinical T1b N0, stage IA invasive ductal carcinoma (E-cadherin positive) estrogen and progesterone receptor positive, HER-2 nonamplified, with an MIB-1 of 15%.  (5) left lumpectomy and sentinel lymph node sampling 07/12/2016 showed a pT1c pN0, stage IA invasive ductal carcinoma, grade 2, with clear margins.  (6) Oncotype DX score of 21 predicted a 10 year risk of outside the breast recurrence of 13% if the patient's only systemic therapy was tamoxifen  for 5 years. It also predicted no benefit from chemotherapy  (7) adjuvant radiation completed 10/08/2016  (8) started tamoxifen  11/01/2016  (a) tamoxifen  discontinued AUG 2020 with hot flashes  (b) anastrozole TIW started August 2020, discontinued NOV 2020  (c) tamoxifen  resumed NOV 2020 at 10 mg    PLAN: Assessment & Plan History of breast cancer No evidence of recurrence or new malignancy.  Normal mammogram in June and no palpable masses on examination. - Reviewed tamoxifen  discontinuation history. - Confirmed absence of new breast symptoms. - Recommended annual follow-up.  Postmenopausal atrophic vaginitis Experiencing vaginal dryness, consistent with postmenopausal atrophic vaginitis. No vasomotor symptoms. Discontinued Veozah . Ok to use intermittent vaginal estrogen  - Advised continued use of vaginal  estrogen as directed. - Discussed use of coconut oil for vaginal dryness. - Recommended trial of Replens (hyaluronic acid wipes). - Recommended trial of Uberlube (moisturizing oil). - Provided written instructions for Replens and Uberlube.   Total time spent: 30 min  *Total Encounter Time as defined by the Centers for Medicare and Medicaid Services includes, in addition to the face-to-face time of a patient visit (documented in the note above) non-face-to-face time: obtaining and reviewing outside history, ordering and reviewing medications, tests or procedures, care coordination (communications with other health care professionals or caregivers) and documentation in the medical record. "

## 2025-01-18 ENCOUNTER — Inpatient Hospital Stay: Admitting: Hematology and Oncology
